# Patient Record
Sex: Male | Born: 1955
Health system: Southern US, Community
[De-identification: ages and names within clinical notes are randomized; demographics above are authoritative.]

## PROBLEM LIST (undated history)

## (undated) DIAGNOSIS — Z800 Family history of malignant neoplasm of digestive organs: Secondary | ICD-10-CM

## (undated) DIAGNOSIS — H9190 Unspecified hearing loss, unspecified ear: Secondary | ICD-10-CM

## (undated) DIAGNOSIS — K219 Gastro-esophageal reflux disease without esophagitis: Secondary | ICD-10-CM

## (undated) DIAGNOSIS — Z8619 Personal history of other infectious and parasitic diseases: Secondary | ICD-10-CM

## (undated) DIAGNOSIS — M21969 Unspecified acquired deformity of unspecified lower leg: Secondary | ICD-10-CM

## (undated) DIAGNOSIS — IMO0001 Reserved for inherently not codable concepts without codable children: Secondary | ICD-10-CM

## (undated) DIAGNOSIS — Q909 Down syndrome, unspecified: Secondary | ICD-10-CM

## (undated) DIAGNOSIS — R9431 Abnormal electrocardiogram [ECG] [EKG]: Secondary | ICD-10-CM

## (undated) DIAGNOSIS — M109 Gout, unspecified: Secondary | ICD-10-CM

## (undated) DIAGNOSIS — I447 Left bundle-branch block, unspecified: Secondary | ICD-10-CM

## (undated) DIAGNOSIS — Z598 Other problems related to housing and economic circumstances: Secondary | ICD-10-CM

## (undated) DIAGNOSIS — E785 Hyperlipidemia, unspecified: Secondary | ICD-10-CM

## (undated) DIAGNOSIS — M1990 Unspecified osteoarthritis, unspecified site: Secondary | ICD-10-CM

## (undated) DIAGNOSIS — N183 Chronic kidney disease, stage 3 unspecified: Secondary | ICD-10-CM

## (undated) DIAGNOSIS — K8020 Calculus of gallbladder without cholecystitis without obstruction: Secondary | ICD-10-CM

## (undated) DIAGNOSIS — N3289 Other specified disorders of bladder: Secondary | ICD-10-CM

## (undated) DIAGNOSIS — R413 Other amnesia: Secondary | ICD-10-CM

## (undated) HISTORY — DX: Other amnesia: R41.3

## (undated) HISTORY — DX: Down syndrome, unspecified: Q90.9

---

## 2004-03-31 ENCOUNTER — Emergency Department (HOSPITAL_COMMUNITY): Admission: EM | Admit: 2004-03-31 | Discharge: 2004-03-31 | Payer: Self-pay | Admitting: Family Medicine

## 2004-04-07 ENCOUNTER — Emergency Department (HOSPITAL_COMMUNITY): Admission: EM | Admit: 2004-04-07 | Discharge: 2004-04-07 | Payer: Self-pay | Admitting: Family Medicine

## 2010-03-13 ENCOUNTER — Ambulatory Visit (HOSPITAL_COMMUNITY): Admission: RE | Admit: 2010-03-13 | Discharge: 2010-03-13 | Payer: Self-pay | Admitting: Gastroenterology

## 2010-03-13 HISTORY — PX: COLONOSCOPY: SHX174

## 2014-08-08 DIAGNOSIS — M2012 Hallux valgus (acquired), left foot: Secondary | ICD-10-CM | POA: Diagnosis not present

## 2014-08-08 DIAGNOSIS — M2011 Hallux valgus (acquired), right foot: Secondary | ICD-10-CM | POA: Diagnosis not present

## 2014-08-08 DIAGNOSIS — M79674 Pain in right toe(s): Secondary | ICD-10-CM | POA: Diagnosis not present

## 2014-08-08 DIAGNOSIS — M2042 Other hammer toe(s) (acquired), left foot: Secondary | ICD-10-CM | POA: Diagnosis not present

## 2014-08-08 DIAGNOSIS — L602 Onychogryphosis: Secondary | ICD-10-CM | POA: Diagnosis not present

## 2014-08-08 DIAGNOSIS — M79675 Pain in left toe(s): Secondary | ICD-10-CM | POA: Diagnosis not present

## 2014-08-08 DIAGNOSIS — M2041 Other hammer toe(s) (acquired), right foot: Secondary | ICD-10-CM | POA: Diagnosis not present

## 2014-08-08 DIAGNOSIS — B351 Tinea unguium: Secondary | ICD-10-CM | POA: Diagnosis not present

## 2016-01-19 ENCOUNTER — Encounter (HOSPITAL_COMMUNITY): Payer: Self-pay | Admitting: Emergency Medicine

## 2016-01-19 ENCOUNTER — Emergency Department (HOSPITAL_COMMUNITY)
Admission: EM | Admit: 2016-01-19 | Discharge: 2016-01-19 | Disposition: A | Discharge status: Home / Self Care | Payer: Medicare Other | Attending: Emergency Medicine | Admitting: Emergency Medicine

## 2016-01-19 ENCOUNTER — Emergency Department (HOSPITAL_COMMUNITY): Payer: Medicare Other

## 2016-01-19 DIAGNOSIS — M25462 Effusion, left knee: Secondary | ICD-10-CM

## 2016-01-19 DIAGNOSIS — M11262 Other chondrocalcinosis, left knee: Secondary | ICD-10-CM

## 2016-01-19 DIAGNOSIS — M25562 Pain in left knee: Secondary | ICD-10-CM | POA: Diagnosis present

## 2016-01-19 DIAGNOSIS — M11862 Other specified crystal arthropathies, left knee: Secondary | ICD-10-CM | POA: Diagnosis not present

## 2016-01-19 LAB — GLUCOSE, SYNOVIAL FLUID: Glucose, Synovial Fluid: 36 mg/dL

## 2016-01-19 LAB — PROTEIN, SYNOVIAL FLUID: PROTEIN, SYNOVIAL FLUID: 4.9 g/dL — AB (ref 1.0–3.0)

## 2016-01-19 LAB — GRAM STAIN

## 2016-01-19 LAB — SYNOVIAL CELL COUNT + DIFF, W/ CRYSTALS
Eosinophils-Synovial: 0 % (ref 0–1)
LYMPHOCYTES-SYNOVIAL FLD: 0 % (ref 0–20)
Monocyte-Macrophage-Synovial Fluid: 10 % — ABNORMAL LOW (ref 50–90)
NEUTROPHIL, SYNOVIAL: 90 % — AB (ref 0–25)
WBC, SYNOVIAL: 26125 /mm3 — AB (ref 0–200)

## 2016-01-19 LAB — BASIC METABOLIC PANEL
ANION GAP: 11 (ref 5–15)
BUN: 17 mg/dL (ref 6–20)
CALCIUM: 8.9 mg/dL (ref 8.9–10.3)
CO2: 26 mmol/L (ref 22–32)
Chloride: 99 mmol/L — ABNORMAL LOW (ref 101–111)
Creatinine, Ser: 1.39 mg/dL — ABNORMAL HIGH (ref 0.61–1.24)
GFR, EST NON AFRICAN AMERICAN: 53 mL/min — AB (ref >60)
GLUCOSE: 129 mg/dL — AB (ref 65–99)
POTASSIUM: 4.1 mmol/L (ref 3.5–5.1)
Sodium: 136 mmol/L (ref 135–145)

## 2016-01-19 LAB — CBC WITH DIFFERENTIAL/PLATELET
BASOS ABS: 0 10*3/uL (ref 0.0–0.1)
BASOS PCT: 0 %
Eosinophils Absolute: 0 10*3/uL (ref 0.0–0.7)
Eosinophils Relative: 0 %
HEMATOCRIT: 40.2 % (ref 39.0–52.0)
HEMOGLOBIN: 13.2 g/dL (ref 13.0–17.0)
LYMPHS PCT: 14 %
Lymphs Abs: 1.4 10*3/uL (ref 0.7–4.0)
MCH: 32.7 pg (ref 26.0–34.0)
MCHC: 32.8 g/dL (ref 30.0–36.0)
MCV: 99.5 fL (ref 78.0–100.0)
MONO ABS: 1.3 10*3/uL — AB (ref 0.1–1.0)
Monocytes Relative: 14 %
NEUTROS ABS: 6.9 10*3/uL (ref 1.7–7.7)
NEUTROS PCT: 72 %
Platelets: 231 10*3/uL (ref 150–400)
RBC: 4.04 MIL/uL — AB (ref 4.22–5.81)
RDW: 13.7 % (ref 11.5–15.5)
WBC: 9.6 10*3/uL (ref 4.0–10.5)

## 2016-01-19 MED ORDER — COLCHICINE 0.6 MG PO TABS: 0.6000 mg | ORAL_TABLET | Freq: Every day | ORAL | Status: DC

## 2016-01-19 MED ORDER — MORPHINE SULFATE (PF) 4 MG/ML IV SOLN
4.0000 mg | Freq: Once | INTRAVENOUS | Status: AC
  Administered 2016-01-19: 4 mg via INTRAVENOUS
  Filled 2016-01-19: qty 1

## 2016-01-19 MED ORDER — IBUPROFEN 200 MG PO TABS
400.0000 mg | ORAL_TABLET | Freq: Once | ORAL | Status: AC
  Administered 2016-01-19: 400 mg via ORAL
  Filled 2016-01-19: qty 2

## 2016-01-19 MED ORDER — LIDOCAINE-EPINEPHRINE (PF) 2 %-1:200000 IJ SOLN
20.0000 mL | Freq: Once | INTRAMUSCULAR | Status: AC
  Administered 2016-01-19: 20 mL via INTRADERMAL
  Filled 2016-01-19: qty 20

## 2016-01-19 MED ORDER — ACETAMINOPHEN 325 MG PO TABS
650.0000 mg | ORAL_TABLET | Freq: Once | ORAL | Status: AC
  Administered 2016-01-19: 650 mg via ORAL
  Filled 2016-01-19: qty 2

## 2016-01-19 MED ORDER — NAPROXEN 375 MG PO TABS: 375.0000 mg | ORAL_TABLET | Freq: Two times a day (BID) | ORAL | Status: DC

## 2016-01-19 NOTE — ED Provider Notes (Signed)
CSN: [id number]     Arrival date & time 01/19/16  [id number] History   First MD Initiated Contact with Patient 01/19/16 [phone]     Chief Complaint  Patient presents with  . Knee Pain   LEVEL 5 CAVEAT - MENTAL RETARDATION  (Consider location/radiation/quality/duration/timing/severity/associated sxs/prior Treatment) HPI  60 year old male presents with left knee pain. Patient has a history of Down syndrome and does not usually answer questions verbally. Thus history is taken from the caregiver who is at the bedside. Patient went to the store yesterday with facility staff like typical. Staffing she may walk more than normal. This morning when he woke up he would not get out of bed. He only complaint is left leg hurt. EMS seemed isolate that his knee was swollen and this seems to be the area pain. No obvious injury per the caregiver. No reports of fever. Patient did not complain about his knee or leg before going to bed last night.  History reviewed. No pertinent past medical history. History reviewed. No pertinent past surgical history. History reviewed. No pertinent family history. Social History  Substance Use Topics  . Smoking status: Never Smoker   . Smokeless tobacco: None  . Alcohol Use: No    Review of Systems  Unable to perform ROS: Other      Allergies  Review of patient's allergies indicates not on file.  Home Medications   Prior to Admission medications   Not on File   BP 122/84 mmHg  Pulse 78  Temp(Src) 97.7 F (36.5 C) (Oral)  Resp 16  SpO2 99% Physical Exam  Constitutional: He appears well-developed and well-nourished.  HENT:  Head: Normocephalic and atraumatic.  Right Ear: External ear normal.  Left Ear: External ear normal.  Nose: Nose normal.  Eyes: Right eye exhibits no discharge. Left eye exhibits no discharge.  Neck: Neck supple.  Cardiovascular: Normal rate, normal heart sounds and intact distal pulses.   Pulses:      Dorsalis pedis pulses are 2+ on the  right side, and 2+ on the left side.  Pulmonary/Chest: Effort normal.  Abdominal: He exhibits no distension.  Musculoskeletal: He exhibits no edema.       Left knee: He exhibits decreased range of motion, swelling and effusion. He exhibits no ecchymosis and no erythema. Tenderness found.       Left upper leg: He exhibits no tenderness.       Left lower leg: He exhibits no tenderness.  Limited ROM of left knee  Neurological: He is alert. He is disoriented.  Awake, alert. Does not answer questions appropriately  Skin: Skin is warm and dry.  Nursing note and vitals reviewed.   ED Course  Procedures (including critical care time) Labs Review Labs Reviewed  BASIC METABOLIC PANEL - Abnormal; Notable for the following:    Chloride 99 (*)    Glucose, Bld 129 (*)    Creatinine, Ser 1.39 (*)    GFR calc non Af Amer 53 (*)    All other components within normal limits  CBC WITH DIFFERENTIAL/PLATELET - Abnormal; Notable for the following:    RBC 4.04 (*)    Monocytes Absolute 1.3 (*)    All other components within normal limits  SYNOVIAL CELL COUNT + DIFF, W/ CRYSTALS - Abnormal; Notable for the following:    Appearance-Synovial TURBID (*)    WBC, Synovial 26125 (*)    Neutrophil, Synovial 90 (*)    Monocyte-Macrophage-Synovial Fluid 10 (*)    All other components within  normal limits  GRAM STAIN  CULTURE, BODY FLUID-BOTTLE  GLUCOSE, SYNOVIAL FLUID  PROTEIN, SYNOVIAL FLUID    Imaging Review Dg Knee Complete 4 Views Left  01/19/2016  CLINICAL DATA:  Woke up with left knee pain and swelling this AM. "Warmness" medial aspect of left knee. Patient did extra walking yesterday. No witnessed injury and patient unable to vocalize due of Downs Syndrome. Unable to obtain medial oblique secondary to patient's pain. EXAM: LEFT KNEE - COMPLETE 4+ VIEW COMPARISON:  None. FINDINGS: There is significant tricompartmental degenerative change. Chondrocalcinosis is present. Moderate to large joint effusion  is present. The patella appears somewhat fragmented, favored to be related to degenerative changes/bipartite anatomy. However, is difficult to exclude a patellar fracture if there has been trauma. IMPRESSION: 1. Significant tricompartmental degenerative changes. 2. Moderate to large joint effusion. 3. Mild fragmentation of the patella, likely vary of normal or related to degenerative changes. Recommend a sunrise view for further evaluation. Electronically Signed   By: Nolon Nations M.D.   On: 01/19/2016 08:32   I have personally reviewed and evaluated these images and lab results as part of my medical decision-making.   EKG Interpretation None      MDM   Final diagnoses:  Knee effusion, left  Pseudogout of knee, left    Patient was worked up for possible septic joint given atraumatic left knee effusion with warmth without cellulitis. I discussed with the power of attorney, his brother-in-law, Grayce Sessions. He has verbally given me consent to do an arthrocentesis given the patient is not able to consent at this time. He is able to range his leg a little bit more after the arthrocentesis but he still has trouble and relating. However he is able to ambulate with a walker. The caregiver at the bedside feels like the facility can get a walker if he is given a prescription. His workup is consistent with pseudogout. No organisms seen on synovial fluid and this is not consistent with septic joint. Will treat with NSAIDs and refer to PCP and orthopedics.   Sherwood Gambler, MD 01/19/16 [phone]

## 2016-01-19 NOTE — ED Notes (Signed)
Pt to ER via [facility] with complaint of left knee pain. Pt coming from group home, care giver reports patient did a lot of extra walking yesterday. Pt mental status is at baseline, does not answer questions appropriately.

## 2016-01-19 NOTE — ED Notes (Signed)
Ambulated pt with walker to restroom. Pt had no difficulties or any other issues.

## 2016-01-19 NOTE — ED Provider Notes (Signed)
  Physical Exam  BP 119/75 mmHg  Pulse 79  Temp(Src) 97.7 F (36.5 C) (Oral)  Resp 16  SpO2 100%  Physical Exam  ED Course  .Joint Aspiration/Arthrocentesis Date/Time: 01/19/2016 10:10 AM Performed by: Monico Blitz Authorized by: Monico Blitz Consent: Verbal consent obtained. Risks and benefits: risks, benefits and alternatives were discussed Consent given by: patient Required items: required blood products, implants, devices, and special equipment available Patient identity confirmed: verbally with patient Time out: Immediately prior to procedure a "time out" was called to verify the correct patient, procedure, equipment, support staff and site/side marked as required. Indications: joint swelling,  pain,  possible septic joint and diagnostic evaluation  Body area: knee Joint: left knee Local anesthesia used: yes Local anesthetic: lidocaine 2% with epinephrine Anesthetic total: 6 ml Patient sedated: no Preparation: Patient was prepped and draped in the usual sterile fashion. Needle gauge: 18 G Ultrasound guidance: no Approach: superior Aspirate: yellow and cloudy Aspirate amount: 80 mL Patient tolerance: Patient tolerated the procedure well with no immediate complications         Monico Blitz, PA-C 01/19/16 [place], MD 01/19/16 1639

## 2016-01-24 LAB — CULTURE, BODY FLUID-BOTTLE: CULTURE: NO GROWTH

## 2016-02-25 DIAGNOSIS — M25562 Pain in left knee: Secondary | ICD-10-CM | POA: Diagnosis not present

## 2016-02-27 DIAGNOSIS — M109 Gout, unspecified: Secondary | ICD-10-CM | POA: Diagnosis not present

## 2016-02-27 DIAGNOSIS — N183 Chronic kidney disease, stage 3 (moderate): Secondary | ICD-10-CM | POA: Diagnosis not present

## 2016-02-27 DIAGNOSIS — I872 Venous insufficiency (chronic) (peripheral): Secondary | ICD-10-CM | POA: Diagnosis not present

## 2016-02-27 DIAGNOSIS — R7309 Other abnormal glucose: Secondary | ICD-10-CM | POA: Diagnosis not present

## 2016-02-27 DIAGNOSIS — Z125 Encounter for screening for malignant neoplasm of prostate: Secondary | ICD-10-CM | POA: Diagnosis not present

## 2016-02-27 DIAGNOSIS — Z1389 Encounter for screening for other disorder: Secondary | ICD-10-CM | POA: Diagnosis not present

## 2016-02-27 DIAGNOSIS — E7800 Pure hypercholesterolemia, unspecified: Secondary | ICD-10-CM | POA: Diagnosis not present

## 2016-02-27 DIAGNOSIS — M1990 Unspecified osteoarthritis, unspecified site: Secondary | ICD-10-CM | POA: Diagnosis not present

## 2016-02-27 DIAGNOSIS — I8310 Varicose veins of unspecified lower extremity with inflammation: Secondary | ICD-10-CM | POA: Diagnosis not present

## 2016-02-27 DIAGNOSIS — Q909 Down syndrome, unspecified: Secondary | ICD-10-CM | POA: Diagnosis not present

## 2016-02-27 DIAGNOSIS — Z0000 Encounter for general adult medical examination without abnormal findings: Secondary | ICD-10-CM | POA: Diagnosis not present

## 2016-03-09 DIAGNOSIS — M109 Gout, unspecified: Secondary | ICD-10-CM | POA: Diagnosis not present

## 2016-03-09 DIAGNOSIS — D72829 Elevated white blood cell count, unspecified: Secondary | ICD-10-CM | POA: Diagnosis not present

## 2016-03-25 DIAGNOSIS — M109 Gout, unspecified: Secondary | ICD-10-CM | POA: Diagnosis not present

## 2016-05-27 DIAGNOSIS — M10032 Idiopathic gout, left wrist: Secondary | ICD-10-CM | POA: Diagnosis not present

## 2016-06-30 DIAGNOSIS — H2513 Age-related nuclear cataract, bilateral: Secondary | ICD-10-CM | POA: Diagnosis not present

## 2016-07-07 ENCOUNTER — Emergency Department (HOSPITAL_COMMUNITY): Payer: Medicare Other

## 2016-07-07 ENCOUNTER — Encounter (HOSPITAL_COMMUNITY): Payer: Self-pay | Admitting: Emergency Medicine

## 2016-07-07 ENCOUNTER — Emergency Department (HOSPITAL_COMMUNITY)
Admission: EM | Admit: 2016-07-07 | Discharge: 2016-07-08 | Disposition: A | Discharge status: Home / Self Care | Payer: Medicare Other | Attending: Emergency Medicine | Admitting: Emergency Medicine

## 2016-07-07 DIAGNOSIS — Z79899 Other long term (current) drug therapy: Secondary | ICD-10-CM | POA: Insufficient documentation

## 2016-07-07 DIAGNOSIS — R0602 Shortness of breath: Secondary | ICD-10-CM | POA: Diagnosis not present

## 2016-07-07 DIAGNOSIS — R1013 Epigastric pain: Secondary | ICD-10-CM | POA: Insufficient documentation

## 2016-07-07 DIAGNOSIS — R1084 Generalized abdominal pain: Secondary | ICD-10-CM | POA: Diagnosis not present

## 2016-07-07 HISTORY — DX: Gout, unspecified: M10.9

## 2016-07-07 HISTORY — DX: Down syndrome, unspecified: Q90.9

## 2016-07-07 LAB — COMPREHENSIVE METABOLIC PANEL
ALBUMIN: 3.4 g/dL — AB (ref 3.5–5.0)
ALK PHOS: 73 U/L (ref 38–126)
ALT: 14 U/L — ABNORMAL LOW (ref 17–63)
AST: 22 U/L (ref 15–41)
Anion gap: 9 (ref 5–15)
BILIRUBIN TOTAL: 0.9 mg/dL (ref 0.3–1.2)
BUN: 18 mg/dL (ref 6–20)
CO2: 25 mmol/L (ref 22–32)
Calcium: 8.8 mg/dL — ABNORMAL LOW (ref 8.9–10.3)
Chloride: 103 mmol/L (ref 101–111)
Creatinine, Ser: 1.36 mg/dL — ABNORMAL HIGH (ref 0.61–1.24)
GFR calc Af Amer: >60 mL/min (ref >60)
GFR calc non Af Amer: 54 mL/min — ABNORMAL LOW (ref >60)
GLUCOSE: 107 mg/dL — AB (ref 65–99)
POTASSIUM: 3.4 mmol/L — AB (ref 3.5–5.1)
Sodium: 137 mmol/L (ref 135–145)
TOTAL PROTEIN: 6.5 g/dL (ref 6.5–8.1)

## 2016-07-07 LAB — LIPASE, BLOOD: Lipase: 17 U/L (ref 11–51)

## 2016-07-07 LAB — I-STAT CG4 LACTIC ACID, ED: LACTIC ACID, VENOUS: 0.83 mmol/L (ref 0.5–1.9)

## 2016-07-07 LAB — CBC WITH DIFFERENTIAL/PLATELET
BASOS ABS: 0 10*3/uL (ref 0.0–0.1)
BASOS PCT: 0 %
Eosinophils Absolute: 0.1 10*3/uL (ref 0.0–0.7)
Eosinophils Relative: 1 %
HEMATOCRIT: 36.2 % — AB (ref 39.0–52.0)
HEMOGLOBIN: 12.4 g/dL — AB (ref 13.0–17.0)
Lymphocytes Relative: 16 %
Lymphs Abs: 1.6 10*3/uL (ref 0.7–4.0)
MCH: 33.5 pg (ref 26.0–34.0)
MCHC: 34.3 g/dL (ref 30.0–36.0)
MCV: 97.8 fL (ref 78.0–100.0)
Monocytes Absolute: 0.8 10*3/uL (ref 0.1–1.0)
Monocytes Relative: 8 %
NEUTROS ABS: 7.8 10*3/uL — AB (ref 1.7–7.7)
NEUTROS PCT: 75 %
Platelets: 180 10*3/uL (ref 150–400)
RBC: 3.7 MIL/uL — ABNORMAL LOW (ref 4.22–5.81)
RDW: 14.9 % (ref 11.5–15.5)
WBC: 10.4 10*3/uL (ref 4.0–10.5)

## 2016-07-07 LAB — I-STAT TROPONIN, ED: Troponin i, poc: 0 ng/mL (ref 0.00–0.08)

## 2016-07-07 MED ORDER — ALUM & MAG HYDROXIDE-SIMETH 200-200-20 MG/5ML PO SUSP
30.0000 mL | Freq: Once | ORAL | Status: AC
  Administered 2016-07-07: 30 mL via ORAL
  Filled 2016-07-07: qty 30

## 2016-07-07 MED ORDER — ONDANSETRON HCL 4 MG/2ML IJ SOLN
4.0000 mg | Freq: Once | INTRAMUSCULAR | Status: AC
  Administered 2016-07-07: 4 mg via INTRAVENOUS
  Filled 2016-07-07: qty 2

## 2016-07-07 MED ORDER — MORPHINE SULFATE (PF) 2 MG/ML IV SOLN
2.0000 mg | Freq: Once | INTRAVENOUS | Status: AC
  Administered 2016-07-07: 2 mg via INTRAVENOUS
  Filled 2016-07-07: qty 1

## 2016-07-07 NOTE — ED Notes (Signed)
Pt reports abdominal pain began after eating dinner.

## 2016-07-07 NOTE — ED Triage Notes (Signed)
Per EMS, pt from a group home with c/o abdominal pain and epigastric pain beginning today. Per group home pt has no n/v/d. BP-118/70, SpO2-100% ra, HR-73, CBG-92

## 2016-07-07 NOTE — ED Provider Notes (Signed)
[facility] DEPT Provider Note   CSN: [id number] Arrival date & time: 07/07/16  2134     History   Chief Complaint Chief Complaint  Patient presents with  . Abdominal Pain    HPI Sean Murray is a 60 y.o. male.  The history is provided by the patient and a caregiver. The history is limited by a developmental delay (Down Syndrome).  Abdominal Pain   This is a new problem. The current episode started 1 to 2 hours ago. The problem occurs constantly. The problem has not changed since onset.The pain is associated with eating (started shortly after eating a Southern meal). The pain is located in the epigastric region. The quality of the pain is aching ("soreness"). The pain is moderate. Pertinent negatives include fever, diarrhea, vomiting and headaches. Associated symptoms comments: Not able to answer whether pt has nausea. The symptoms are aggravated by eating and certain positions. The symptoms are relieved by certain positions. Past workup does not include surgery.    Past Medical History:  Diagnosis Date  . Down syndrome   . Gout     There are no active problems to display for this patient.   History reviewed. No pertinent surgical history.     Home Medications    Prior to Admission medications   Medication Sig Start Date End Date Taking? Authorizing Provider  allopurinol (ZYLOPRIM) 100 MG tablet Take 200 mg by mouth daily.    Historical Provider, MD  clotrimazole-betamethasone (LOTRISONE) cream Apply 1 application topically 2 (two) times daily.    Historical Provider, MD  guaifenesin (ROBITUSSIN) 100 MG/5ML syrup Take 200 mg by mouth 3 (three) times daily as needed for cough.    Historical Provider, MD  Lutein 20 MG TABS Take 1 tablet by mouth daily.    Historical Provider, MD  Multiple Vitamins-Minerals (MULTIVITAMIN WITH MINERALS) tablet Take 1 tablet by mouth daily.    Historical Provider, MD  naproxen (NAPROSYN) 375 MG tablet Take 1 tablet (375 mg total) by mouth 2  (two) times daily. 01/19/16   Sherwood Gambler, MD  ranitidine (ZANTAC) 150 MG tablet Take 1 tablet (150 mg total) by mouth 2 (two) times daily. 07/08/16 07/22/16  Paralee Cancel, MD  simvastatin (ZOCOR) 20 MG tablet Take 20 mg by mouth daily.    Historical Provider, MD    Family History No family history on file.  Social History Social History  Substance Use Topics  . Smoking status: Never Smoker  . Smokeless tobacco: Never Used  . Alcohol use No     Allergies   Review of patient's allergies indicates no known allergies.   Review of Systems Review of Systems  Constitutional: Negative for diaphoresis and fever.  HENT: Negative for trouble swallowing and voice change.   Respiratory: Positive for shortness of breath.   Cardiovascular: Negative for chest pain.  Gastrointestinal: Positive for abdominal pain. Negative for diarrhea and vomiting.  Genitourinary: Negative for flank pain.  Musculoskeletal: Negative for back pain.  Skin: Negative for rash.  Neurological: Negative for headaches.  Hematological: Does not bruise/bleed easily.     Physical Exam Updated Vital Signs BP 103/75   Pulse 85   Temp 97.8 F (36.6 C) (Oral)   Resp 14   SpO2 100%   Physical Exam  Constitutional: He appears well-developed and well-nourished. He appears distressed.  Mild distress 2/2 pain, cooperative. Exam c/w Down syndrome  HENT:  Head: Normocephalic and atraumatic.  Eyes: Conjunctivae are normal. No scleral icterus.  Neck: Normal range of  motion. Neck supple.  Cardiovascular: Normal rate, regular rhythm and intact distal pulses.   Pulmonary/Chest: Effort normal and breath sounds normal. No respiratory distress.  Abdominal: Soft. He exhibits no distension. There is tenderness. There is no rebound and no guarding.  Epigastric abdominal pain  Musculoskeletal: He exhibits no edema or tenderness.  B/l LE with discoloration c/w chronic venous stasis, warm and perfused legs and feet, no  edema currently  Neurological: He is alert. Coordination normal.  Skin: Skin is warm and dry. Capillary refill takes less than 2 seconds. No rash noted. He is not diaphoretic.  Psychiatric: He has a normal mood and affect.  Nursing note and vitals reviewed.    ED Treatments / Results  Labs (all labs ordered are listed, but only abnormal results are displayed) Labs Reviewed  CBC WITH DIFFERENTIAL/PLATELET - Abnormal; Notable for the following:       Result Value   RBC 3.70 (*)    Hemoglobin 12.4 (*)    HCT 36.2 (*)    Neutro Abs 7.8 (*)    All other components within normal limits  COMPREHENSIVE METABOLIC PANEL - Abnormal; Notable for the following:    Potassium 3.4 (*)    Glucose, Bld 107 (*)    Creatinine, Ser 1.36 (*)    Calcium 8.8 (*)    Albumin 3.4 (*)    ALT 14 (*)    GFR calc non Af Amer 54 (*)    All other components within normal limits  LIPASE, BLOOD  I-STAT TROPOININ, ED  I-STAT CG4 LACTIC ACID, ED  I-STAT TROPOININ, ED    EKG  EKG Interpretation  Date/Time:  Tuesday July 07 2016 21:39:49 EDT Ventricular Rate:  83 PR Interval:    QRS Duration: 132 QT Interval:  429 QTC Calculation: 505 R Axis:   56 Text Interpretation:  Sinus rhythm Left bundle branch block No old tracing to compare Confirmed by FLOYD MD, DANIEL [phone]) on 07/07/2016 10:21:30 PM       Radiology Ct Abdomen Pelvis W Contrast  Result Date: 07/08/2016 CLINICAL DATA:  Acute onset of generalized abdominal pain and epigastric tenderness. Initial encounter. EXAM: CT ABDOMEN AND PELVIS WITH CONTRAST TECHNIQUE: Multidetector CT imaging of the abdomen and pelvis was performed using the standard protocol following bolus administration of intravenous contrast. CONTRAST:  2mL ISOVUE-300 IOPAMIDOL (ISOVUE-300) INJECTION 61% COMPARISON:  None. FINDINGS: Lower chest: Minimal bibasilar atelectasis is noted. The visualized portions of the mediastinum are unremarkable. Hepatobiliary: The liver is  unremarkable in appearance. A small stone is noted within the gallbladder. The gallbladder is otherwise grossly unremarkable. The common bile duct remains normal in caliber. Pancreas: The pancreas is within normal limits. Spleen: The spleen is unremarkable in appearance. Adrenals/Urinary Tract: The adrenal glands are unremarkable in appearance. The kidneys are within normal limits. There is no evidence of hydronephrosis. No renal or ureteral stones are identified. No perinephric stranding is seen. Stomach/Bowel: The stomach is unremarkable in appearance. There is question of mild haziness about the second segment of the duodenum. Would correlate clinically for any evidence of duodenal ulcer. This may simply be artifactual in nature. The remaining small bowel is unremarkable in appearance. The appendix is not visualized; there is no evidence for appendicitis. The colon is unremarkable in appearance. Vascular/Lymphatic: The abdominal aorta is unremarkable in appearance. The inferior vena cava is grossly unremarkable. No retroperitoneal lymphadenopathy is seen. No pelvic sidewall lymphadenopathy is identified. Reproductive: Mild anterior bladder wall thickening is noted, of uncertain significance. An underlying mass  cannot be excluded. The prostate remains normal in size. Other: No additional soft tissue abnormalities are seen. Musculoskeletal: No acute osseous abnormalities are identified. There is chronic compression deformity involving the left side of L5. The visualized musculature is unremarkable in appearance. IMPRESSION: 1. Question of mild haziness about the second segment of the duodenum. This may simply be artifactual in nature, though would correlate clinically for any evidence of duodenal ulcer. 2. Small stone noted within the gallbladder. Gallbladder otherwise unremarkable. 3. Mild anterior bladder wall thickening, of uncertain significance. An underlying mass cannot be entirely excluded. Cystoscopy would  be helpful for further evaluation, when and as deemed clinically appropriate. 4. Chronic compression deformity involving the left side of L5. Electronically Signed   By: Garald Balding M.D.   On: 07/08/2016 01:30   Dg Chest Portable 1 View  Result Date: 07/07/2016 CLINICAL DATA:  Shortness of breath EXAM: PORTABLE CHEST 1 VIEW COMPARISON:  None. FINDINGS: Normal lung inflation. Cardiomediastinal contours are normal. No focal airspace consolidation or pulmonary edema. No pneumothorax or pleural effusion. IMPRESSION: No focal airspace disease. Electronically Signed   By: Ulyses Jarred M.D.   On: 07/07/2016 22:43    Procedures Procedures (including critical care time)  Medications Ordered in ED Medications  morphine 2 MG/ML injection 2 mg (2 mg Intravenous Given 07/07/16 2252)  alum & mag hydroxide-simeth (MAALOX/MYLANTA) 200-200-20 MG/5ML suspension 30 mL (30 mLs Oral Given 07/07/16 2252)  ondansetron (ZOFRAN) injection 4 mg (4 mg Intravenous Given 07/07/16 2252)  iopamidol (ISOVUE-300) 61 % injection 80 mL (80 mLs Intravenous Contrast Given 07/08/16 0040)  famotidine (PEPCID) tablet 20 mg (20 mg Oral Given 07/08/16 0241)     Initial Impression / Assessment and Plan / ED Course  I have reviewed the triage vital signs and the nursing notes.  Pertinent labs & imaging results that were available during my care of the patient were reviewed by me and considered in my medical decision making (see chart for details).  Clinical Course   Sean Murray is a 60 y.o. male with Down Syndrome but no known cardiac problems, no abdominal hx or surgeries, who presents to ED from group home for evaluation of epigastric abdominal pain, acute onset after eating southern-style food tonight. No vomiting, no known diarrhea, though caregivers do not assist pt with bathroom typically, so unknown. On arrival to Ed, pt in mild distress 2/2 pain and complaining of "I can't breath", though noted to have normal RR and O2  sat on Ra. EKG with LBBB, unknown baseline. Delta trop negative. CXR unremarkable, normal mediastinum, no obvious lung abnormalities. Pain resolved with 2mg  IV morphine and Maalox with benign repeat examination. Resolution of dyspnea. CT abdomen/pelvis demonstrates no acute surgical abnormality, ? Duodenal ulcer, this would be c/w with pt sx. Given pepcid in Ed. Rx Zantac. Advised can take OTC Maalox and Tums prn breakthrough pain as well, and to f/u with PCP this week for recheck. Advised to return to ER for any new, worse, or concerning symptoms. The care provider demonstrates understanding of this and comfort with d/c home.   Pt condition, course, and discharge were discussed with attending physician Dr. Deno Etienne.  Final Clinical Impressions(s) / ED Diagnoses   Final diagnoses:  Epigastric pain    New Prescriptions Discharge Medication List as of 07/08/2016  2:14 AM    START taking these medications   Details  ranitidine (ZANTAC) 150 MG tablet Take 1 tablet (150 mg total) by mouth 2 (two) times daily., Starting Wed  07/08/2016, Until Wed 07/22/2016, Print          Paralee Cancel, MD 07/09/16 [place], DO 07/09/16 0117

## 2016-07-08 ENCOUNTER — Emergency Department (HOSPITAL_COMMUNITY): Payer: Medicare Other

## 2016-07-08 DIAGNOSIS — R1084 Generalized abdominal pain: Secondary | ICD-10-CM | POA: Diagnosis not present

## 2016-07-08 DIAGNOSIS — R1013 Epigastric pain: Secondary | ICD-10-CM | POA: Diagnosis not present

## 2016-07-08 LAB — I-STAT TROPONIN, ED: Troponin i, poc: 0 ng/mL (ref 0.00–0.08)

## 2016-07-08 MED ORDER — FAMOTIDINE 20 MG PO TABS
20.0000 mg | ORAL_TABLET | Freq: Once | ORAL | Status: AC
  Administered 2016-07-08: 20 mg via ORAL
  Filled 2016-07-08: qty 1

## 2016-07-08 MED ORDER — IOPAMIDOL (ISOVUE-300) INJECTION 61%
80.0000 mL | Freq: Once | INTRAVENOUS | Status: AC | PRN
  Administered 2016-07-08: 80 mL via INTRAVENOUS

## 2016-07-08 MED ORDER — IOPAMIDOL (ISOVUE-300) INJECTION 61%
INTRAVENOUS | Status: AC
  Filled 2016-07-08: qty 100

## 2016-07-08 MED ORDER — RANITIDINE HCL 150 MG PO TABS: 150.0000 mg | ORAL_TABLET | Freq: Two times a day (BID) | ORAL | 0 refills | Status: DC

## 2016-07-08 NOTE — ED Notes (Signed)
Patient transported to CT 

## 2016-07-09 DIAGNOSIS — M542 Cervicalgia: Secondary | ICD-10-CM | POA: Diagnosis not present

## 2016-07-09 DIAGNOSIS — R109 Unspecified abdominal pain: Secondary | ICD-10-CM | POA: Diagnosis not present

## 2016-07-09 DIAGNOSIS — M62830 Muscle spasm of back: Secondary | ICD-10-CM | POA: Diagnosis not present

## 2016-07-09 DIAGNOSIS — K269 Duodenal ulcer, unspecified as acute or chronic, without hemorrhage or perforation: Secondary | ICD-10-CM | POA: Diagnosis not present

## 2016-07-09 DIAGNOSIS — Z23 Encounter for immunization: Secondary | ICD-10-CM | POA: Diagnosis not present

## 2016-07-17 DIAGNOSIS — L270 Generalized skin eruption due to drugs and medicaments taken internally: Secondary | ICD-10-CM | POA: Diagnosis not present

## 2016-07-20 ENCOUNTER — Other Ambulatory Visit: Payer: Self-pay | Admitting: Internal Medicine

## 2016-07-20 DIAGNOSIS — R945 Abnormal results of liver function studies: Secondary | ICD-10-CM

## 2016-07-21 ENCOUNTER — Other Ambulatory Visit: Payer: Medicaid Other

## 2016-07-23 ENCOUNTER — Ambulatory Visit
Admission: RE | Admit: 2016-07-23 | Discharge: 2016-07-23 | Disposition: A | Discharge status: Home / Self Care | Payer: Medicaid Other | Source: Ambulatory Visit | Attending: Internal Medicine | Admitting: Internal Medicine

## 2016-07-23 DIAGNOSIS — R945 Abnormal results of liver function studies: Secondary | ICD-10-CM

## 2016-07-23 DIAGNOSIS — R7989 Other specified abnormal findings of blood chemistry: Secondary | ICD-10-CM | POA: Diagnosis not present

## 2016-07-31 DIAGNOSIS — L270 Generalized skin eruption due to drugs and medicaments taken internally: Secondary | ICD-10-CM | POA: Diagnosis not present

## 2016-08-04 DIAGNOSIS — R1084 Generalized abdominal pain: Secondary | ICD-10-CM | POA: Diagnosis not present

## 2016-08-04 DIAGNOSIS — R933 Abnormal findings on diagnostic imaging of other parts of digestive tract: Secondary | ICD-10-CM | POA: Diagnosis not present

## 2016-08-18 ENCOUNTER — Other Ambulatory Visit: Payer: Self-pay | Admitting: Gastroenterology

## 2016-08-18 NOTE — Progress Notes (Signed)
I was unable to patients care giver- phone was not in service, I call [employer] and paged the person on call, I did not receive a call back.

## 2016-08-18 NOTE — Progress Notes (Addendum)
Alllison Z, PA for Anesthesia found notes under  Media Tab that had information from EKG in 2005 Left BBB was present at that time.

## 2016-08-19 ENCOUNTER — Encounter (HOSPITAL_COMMUNITY): Payer: Self-pay | Admitting: Certified Registered Nurse Anesthetist

## 2016-08-19 ENCOUNTER — Encounter (HOSPITAL_COMMUNITY): Admission: RE | Payer: Self-pay | Source: Ambulatory Visit

## 2016-08-19 ENCOUNTER — Ambulatory Visit (HOSPITAL_COMMUNITY): Admission: RE | Admit: 2016-08-19 | Payer: Medicare Other | Source: Ambulatory Visit | Admitting: Gastroenterology

## 2016-08-19 SURGERY — ESOPHAGOGASTRODUODENOSCOPY (EGD) WITH PROPOFOL
Anesthesia: Monitor Anesthesia Care

## 2016-08-28 DIAGNOSIS — N183 Chronic kidney disease, stage 3 (moderate): Secondary | ICD-10-CM | POA: Diagnosis not present

## 2016-08-28 DIAGNOSIS — K269 Duodenal ulcer, unspecified as acute or chronic, without hemorrhage or perforation: Secondary | ICD-10-CM | POA: Diagnosis not present

## 2016-08-28 DIAGNOSIS — M109 Gout, unspecified: Secondary | ICD-10-CM | POA: Diagnosis not present

## 2016-08-28 DIAGNOSIS — Q909 Down syndrome, unspecified: Secondary | ICD-10-CM | POA: Diagnosis not present

## 2016-08-28 DIAGNOSIS — E7800 Pure hypercholesterolemia, unspecified: Secondary | ICD-10-CM | POA: Diagnosis not present

## 2016-08-28 DIAGNOSIS — N33 Bladder disorders in diseases classified elsewhere: Secondary | ICD-10-CM | POA: Diagnosis not present

## 2016-08-28 DIAGNOSIS — D72829 Elevated white blood cell count, unspecified: Secondary | ICD-10-CM | POA: Diagnosis not present

## 2016-10-08 ENCOUNTER — Ambulatory Visit: Payer: Medicare Other | Admitting: Podiatry

## 2016-10-12 ENCOUNTER — Ambulatory Visit (INDEPENDENT_AMBULATORY_CARE_PROVIDER_SITE_OTHER): Payer: Medicare Other | Admitting: Podiatry

## 2016-10-12 ENCOUNTER — Encounter: Payer: Self-pay | Admitting: Podiatry

## 2016-10-12 VITALS — BP 125/88 | HR 74 | Resp 14

## 2016-10-12 DIAGNOSIS — L84 Corns and callosities: Secondary | ICD-10-CM

## 2016-10-12 DIAGNOSIS — B351 Tinea unguium: Secondary | ICD-10-CM

## 2016-10-12 DIAGNOSIS — M2010 Hallux valgus (acquired), unspecified foot: Secondary | ICD-10-CM | POA: Diagnosis not present

## 2016-10-12 DIAGNOSIS — Q828 Other specified congenital malformations of skin: Secondary | ICD-10-CM

## 2016-10-12 DIAGNOSIS — L603 Nail dystrophy: Secondary | ICD-10-CM

## 2016-10-12 DIAGNOSIS — M79672 Pain in left foot: Secondary | ICD-10-CM

## 2016-10-12 DIAGNOSIS — M2041 Other hammer toe(s) (acquired), right foot: Secondary | ICD-10-CM | POA: Diagnosis not present

## 2016-10-12 DIAGNOSIS — L608 Other nail disorders: Secondary | ICD-10-CM

## 2016-10-12 DIAGNOSIS — M2042 Other hammer toe(s) (acquired), left foot: Secondary | ICD-10-CM

## 2016-10-12 DIAGNOSIS — L851 Acquired keratosis [keratoderma] palmaris et plantaris: Secondary | ICD-10-CM

## 2016-10-12 DIAGNOSIS — M79671 Pain in right foot: Secondary | ICD-10-CM | POA: Diagnosis not present

## 2016-10-12 DIAGNOSIS — M79609 Pain in unspecified limb: Secondary | ICD-10-CM

## 2016-10-12 DIAGNOSIS — M79676 Pain in unspecified toe(s): Secondary | ICD-10-CM

## 2016-10-12 DIAGNOSIS — M21619 Bunion of unspecified foot: Secondary | ICD-10-CM | POA: Diagnosis not present

## 2016-10-14 NOTE — Progress Notes (Signed)
   SUBJECTIVE Patient  presents to office today complaining of elongated, thickened nails. Pain while ambulating in shoes. Patient is unable to trim their own nails.   OBJECTIVE General Patient is awake, alert, and oriented x 3 and in no acute distress. Derm hyperkeratotic skin lesions noted overlying the PIPJ of the second digit bilateral. Skin is dry and supple bilateral. Negative open lesions or macerations. Remaining integument unremarkable. Nails are tender, long, thickened and dystrophic with subungual debris, consistent with onychomycosis, 1-5 bilateral. No signs of infection noted. Vasc  DP and PT pedal pulses palpable bilaterally. Temperature gradient within normal limits.  Neuro Epicritic and protective threshold sensation diminished bilaterally.  Musculoskeletal Exam hallux abductovalgus deformity noted bilateral. Hammertoe contracture 2-5 bilateral. No symptomatic pedal deformities noted bilateral. Muscular strength within normal limits.  ASSESSMENT 1. Onychodystrophic nails 1-5 bilateral with hyperkeratosis of nails.  2. Onychomycosis of nail due to dermatophyte bilateral 3. Pain in foot bilateral 4. Painful corn lesion second digit bilateral  PLAN OF CARE 1. Patient evaluated today.  2. Instructed to maintain good pedal hygiene and foot care.  3. Mechanical debridement of nails 1-5 bilaterally performed using a nail nipper. Filed with dremel without incident.  4. Excisional debridement of the callus lesion overlying the PIPJ of the second digit bilaterally was performed using a chisel blade without incident. 5. Return to clinic in 3 mos.    Edrick Kins, DPM [facility]  Dr. Edrick Kins, [address]                                        [place], [place] [address]                Office [phone]  Fax [phone]

## 2016-10-26 DIAGNOSIS — D414 Neoplasm of uncertain behavior of bladder: Secondary | ICD-10-CM | POA: Diagnosis not present

## 2016-10-26 DIAGNOSIS — R350 Frequency of micturition: Secondary | ICD-10-CM | POA: Diagnosis not present

## 2016-10-28 ENCOUNTER — Other Ambulatory Visit: Payer: Self-pay | Admitting: Urology

## 2016-11-05 ENCOUNTER — Encounter (HOSPITAL_BASED_OUTPATIENT_CLINIC_OR_DEPARTMENT_OTHER): Payer: Self-pay | Admitting: *Deleted

## 2016-11-05 NOTE — Progress Notes (Addendum)
SPOKE W/ JACKIE MURPHY RN FROM [employer] , WHOM MONITORS PT MEDICALLY.  OBTAINED PT HX W/ MEDS FROM JACKIE.  AND INSTRUCTIONS FOR DOS GIVEN TO HER.   NPO AFTER MN.  ARRIVE AT 1130.  NEEDS ISTAT.  CURRENT EKG IN CHART AND EPIC. WILL TAKE AM MEDS W/ SIPS OF WATER.    PT WILL BE TRANSPORTED BY PT'S GROUP HOME, [facility], W/ PATIENT PROFESSIONAL AND JACKIE WILL BE ASSESSING PT UPON GETTING BACK TO GROUP HOME.   JACKIE STATED THAT PT'S BROTHER-N-LAW, ERNEST SYKES, IS DUAL POWER-OF-ATTORNEY [phone], CELL#[phone]).  WILL NEED TO CALL AND GET SURGICAL PERMIT SIGNED W/ 2 WITNESS'S.  ADDENDUM:  REVIEWED CHART W/ DR MASSAGEE MDA,  STATED PT HAS DOWN SYNDROME W/ AN EKG DONE 10/ 2017 AT ED THAT SHOW LBBB AND BORDERLINE DIABETES.  NEED H&P FROM HIS MEDICAL DOCTOR STATING DIABETES STABLE AND ADDRESS EKG FINDING SINCE NO PREVIOUS EKG TO COMPARE TO.  PT WILL NEED THIS BEFORE HAVING SURGERY W/ GENERAL ANESTHESIA.  CALLED AND LM FOR SELITA, TO CALL BACK.  ALSO, CALLED TO SPEAK W/ JACKIE MURPHY RN BUT WILL NEED TO CALL BACK WHEN SHE IS THE OFFICE AT 1030.  DR MASSAGEE MDA,  ALSO STATED HE WILL SPEAK W/ DR MCKENZIE TODAY.  ADDENDUM:  RECEIVED CALL BACK FROM JACKIE MURPHY RN AND OBTAINED PT'S WHOM HIS PCP IS,  DR Romelle Starcher HUSAIN ([facility]).  CALLED AND LM FOR DR HUSAIN NURSE.  RECEIVED CALL BACK FROM NICOLE , DR HUSAIN NURSE.  NICOLE STATED THAT DR HUSAIN HAS LEFT FOR THE DAY BUT SHE ABLE TO FAX PT'S LAST TWO OFFICE VISITS (THEY DID NOT HAVE A EKG ).  AFTER RECEIVING FAX, REVIEWED CHART W/ DR MASSAGEE MDA,  OK TO PROCEED SINCE LBBB APPEARS OLD AND PCP NOTED THIS IN HIS VISIT NOTE.  CALLED AND SPOKE W/ PT'S HCPOA, ERNEST SYKES FOR TELEPHONE CONSENT FOR SURGICAL PERMIT.  ERNEST VERBALIZED UNDERSTANDING OF PROCEDURE W/ NO FARTHER QUESTIONS.  PERMIT SIGNED BY 2 RN'S, MYSELF AND SHARON COOK.

## 2016-11-06 ENCOUNTER — Encounter (HOSPITAL_BASED_OUTPATIENT_CLINIC_OR_DEPARTMENT_OTHER): Payer: Self-pay | Admitting: *Deleted

## 2016-11-09 ENCOUNTER — Ambulatory Visit (HOSPITAL_BASED_OUTPATIENT_CLINIC_OR_DEPARTMENT_OTHER): Payer: Medicare Other | Admitting: Anesthesiology

## 2016-11-09 ENCOUNTER — Ambulatory Visit (HOSPITAL_BASED_OUTPATIENT_CLINIC_OR_DEPARTMENT_OTHER)
Admission: RE | Admit: 2016-11-09 | Discharge: 2016-11-09 | Disposition: A | Discharge status: Home / Self Care | Payer: Medicare Other | Source: Ambulatory Visit | Attending: Urology | Admitting: Urology

## 2016-11-09 ENCOUNTER — Encounter (HOSPITAL_BASED_OUTPATIENT_CLINIC_OR_DEPARTMENT_OTHER)
Admission: RE | Disposition: A | Discharge status: Home / Self Care | Payer: Self-pay | Source: Ambulatory Visit | Attending: Urology

## 2016-11-09 ENCOUNTER — Encounter (HOSPITAL_BASED_OUTPATIENT_CLINIC_OR_DEPARTMENT_OTHER): Payer: Self-pay | Admitting: *Deleted

## 2016-11-09 DIAGNOSIS — N183 Chronic kidney disease, stage 3 (moderate): Secondary | ICD-10-CM | POA: Diagnosis not present

## 2016-11-09 DIAGNOSIS — I447 Left bundle-branch block, unspecified: Secondary | ICD-10-CM | POA: Insufficient documentation

## 2016-11-09 DIAGNOSIS — N329 Bladder disorder, unspecified: Secondary | ICD-10-CM | POA: Diagnosis not present

## 2016-11-09 DIAGNOSIS — D494 Neoplasm of unspecified behavior of bladder: Secondary | ICD-10-CM | POA: Diagnosis not present

## 2016-11-09 DIAGNOSIS — Z79899 Other long term (current) drug therapy: Secondary | ICD-10-CM | POA: Insufficient documentation

## 2016-11-09 DIAGNOSIS — E785 Hyperlipidemia, unspecified: Secondary | ICD-10-CM | POA: Diagnosis not present

## 2016-11-09 DIAGNOSIS — N3289 Other specified disorders of bladder: Secondary | ICD-10-CM | POA: Insufficient documentation

## 2016-11-09 DIAGNOSIS — M109 Gout, unspecified: Secondary | ICD-10-CM | POA: Insufficient documentation

## 2016-11-09 DIAGNOSIS — Q909 Down syndrome, unspecified: Secondary | ICD-10-CM | POA: Diagnosis not present

## 2016-11-09 DIAGNOSIS — K219 Gastro-esophageal reflux disease without esophagitis: Secondary | ICD-10-CM | POA: Diagnosis not present

## 2016-11-09 HISTORY — DX: Reserved for inherently not codable concepts without codable children: IMO0001

## 2016-11-09 HISTORY — DX: Family history of malignant neoplasm of digestive organs: Z80.0

## 2016-11-09 HISTORY — PX: CYSTOSCOPY W/ RETROGRADES: SHX1426

## 2016-11-09 HISTORY — DX: Chronic kidney disease, stage 3 (moderate): N18.3

## 2016-11-09 HISTORY — DX: Left bundle-branch block, unspecified: I44.7

## 2016-11-09 HISTORY — DX: Unspecified osteoarthritis, unspecified site: M19.90

## 2016-11-09 HISTORY — DX: Other problems related to housing and economic circumstances: Z59.8

## 2016-11-09 HISTORY — DX: Hyperlipidemia, unspecified: E78.5

## 2016-11-09 HISTORY — DX: Chronic kidney disease, stage 3 unspecified: N18.30

## 2016-11-09 HISTORY — DX: Gout, unspecified: M10.9

## 2016-11-09 HISTORY — DX: Gastro-esophageal reflux disease without esophagitis: K21.9

## 2016-11-09 HISTORY — DX: Other specified disorders of bladder: N32.89

## 2016-11-09 HISTORY — PX: CYSTOSCOPY WITH BIOPSY: SHX5122

## 2016-11-09 HISTORY — DX: Personal history of other infectious and parasitic diseases: Z86.19

## 2016-11-09 HISTORY — DX: Unspecified hearing loss, unspecified ear: H91.90

## 2016-11-09 HISTORY — DX: Calculus of gallbladder without cholecystitis without obstruction: K80.20

## 2016-11-09 LAB — POCT I-STAT, CHEM 8
BUN: 26 mg/dL — AB (ref 6–20)
CALCIUM ION: 1.23 mmol/L (ref 1.15–1.40)
CHLORIDE: 102 mmol/L (ref 101–111)
CREATININE: 1.4 mg/dL — AB (ref 0.61–1.24)
GLUCOSE: 82 mg/dL (ref 65–99)
HCT: 41 % (ref 39.0–52.0)
Hemoglobin: 13.9 g/dL (ref 13.0–17.0)
Potassium: 4.1 mmol/L (ref 3.5–5.1)
Sodium: 142 mmol/L (ref 135–145)
TCO2: 28 mmol/L (ref 0–100)

## 2016-11-09 SURGERY — CYSTOSCOPY, WITH RETROGRADE PYELOGRAM
Anesthesia: General | Site: Renal

## 2016-11-09 MED ORDER — LIDOCAINE 2% (20 MG/ML) 5 ML SYRINGE
INTRAMUSCULAR | Status: AC
  Filled 2016-11-09: qty 5

## 2016-11-09 MED ORDER — TRAMADOL HCL 50 MG PO TABS
50.0000 mg | ORAL_TABLET | Freq: Once | ORAL | Status: AC
  Administered 2016-11-09: 50 mg via ORAL
  Filled 2016-11-09: qty 1

## 2016-11-09 MED ORDER — FENTANYL CITRATE (PF) 100 MCG/2ML IJ SOLN
INTRAMUSCULAR | Status: AC
  Filled 2016-11-09: qty 2

## 2016-11-09 MED ORDER — MIDAZOLAM HCL 2 MG/2ML IJ SOLN
INTRAMUSCULAR | Status: AC
  Filled 2016-11-09: qty 2

## 2016-11-09 MED ORDER — CEFAZOLIN SODIUM-DEXTROSE 2-4 GM/100ML-% IV SOLN
2.0000 g | INTRAVENOUS | Status: AC
  Administered 2016-11-09: 2 g via INTRAVENOUS
  Filled 2016-11-09: qty 100

## 2016-11-09 MED ORDER — PROPOFOL 10 MG/ML IV BOLUS
INTRAVENOUS | Status: AC
  Filled 2016-11-09: qty 20

## 2016-11-09 MED ORDER — LACTATED RINGERS IV SOLN
INTRAVENOUS | Status: DC
  Administered 2016-11-09: 13:00:00 via INTRAVENOUS
  Filled 2016-11-09: qty 1000

## 2016-11-09 MED ORDER — MIDAZOLAM HCL 5 MG/5ML IJ SOLN
INTRAMUSCULAR | Status: DC | PRN
  Administered 2016-11-09: 1 mg via INTRAVENOUS

## 2016-11-09 MED ORDER — LIDOCAINE 2% (20 MG/ML) 5 ML SYRINGE
INTRAMUSCULAR | Status: DC | PRN
  Administered 2016-11-09: 50 mg via INTRAVENOUS

## 2016-11-09 MED ORDER — TRAMADOL HCL 50 MG PO TABS
ORAL_TABLET | ORAL | Status: AC
  Filled 2016-11-09: qty 1

## 2016-11-09 MED ORDER — FENTANYL CITRATE (PF) 100 MCG/2ML IJ SOLN
INTRAMUSCULAR | Status: DC | PRN
  Administered 2016-11-09: 50 ug via INTRAVENOUS

## 2016-11-09 MED ORDER — ONDANSETRON HCL 4 MG/2ML IJ SOLN
INTRAMUSCULAR | Status: DC | PRN
  Administered 2016-11-09: 4 mg via INTRAVENOUS

## 2016-11-09 MED ORDER — CEFAZOLIN IN D5W 1 GM/50ML IV SOLN
1.0000 g | INTRAVENOUS | Status: DC
Start: 2016-11-09 — End: 2016-11-09
  Filled 2016-11-09: qty 50

## 2016-11-09 MED ORDER — PROPOFOL 10 MG/ML IV BOLUS
INTRAVENOUS | Status: DC | PRN
  Administered 2016-11-09: 100 mg via INTRAVENOUS

## 2016-11-09 MED ORDER — TRAMADOL HCL 50 MG PO TABS: 50.0000 mg | ORAL_TABLET | Freq: Four times a day (QID) | ORAL | 0 refills | Status: DC | PRN

## 2016-11-09 MED ORDER — IOHEXOL 300 MG/ML  SOLN
INTRAMUSCULAR | Status: DC | PRN
  Administered 2016-11-09: 10 mL

## 2016-11-09 MED ORDER — STERILE WATER FOR IRRIGATION IR SOLN
Status: DC | PRN
  Administered 2016-11-09: 3000 mL

## 2016-11-09 MED ORDER — CEFAZOLIN SODIUM-DEXTROSE 2-4 GM/100ML-% IV SOLN
INTRAVENOUS | Status: AC
  Filled 2016-11-09: qty 100

## 2016-11-09 SURGICAL SUPPLY — 26 items
ADAPTER CATH WHT DISP STRL (CATHETERS) IMPLANT
BAG DRAIN URO-CYSTO SKYTR STRL (DRAIN) ×8 IMPLANT
BASKET ZERO TIP NITINOL 2.4FR (BASKET) IMPLANT
CATH INTERMIT  6FR 70CM (CATHETERS) ×4 IMPLANT
CLOTH BEACON ORANGE TIMEOUT ST (SAFETY) ×4 IMPLANT
ELECT REM PT RETURN 9FT ADLT (ELECTROSURGICAL) ×4
ELECTRODE REM PT RTRN 9FT ADLT (ELECTROSURGICAL) ×2 IMPLANT
GLOVE BIO SURGEON STRL SZ8 (GLOVE) ×4 IMPLANT
GLOVE BIOGEL PI IND STRL 7.5 (GLOVE) ×4 IMPLANT
GLOVE BIOGEL PI INDICATOR 7.5 (GLOVE) ×4
GOWN STRL REUS W/ TWL LRG LVL3 (GOWN DISPOSABLE) ×2 IMPLANT
GOWN STRL REUS W/ TWL XL LVL3 (GOWN DISPOSABLE) ×2 IMPLANT
GOWN STRL REUS W/TWL LRG LVL3 (GOWN DISPOSABLE) ×2
GOWN STRL REUS W/TWL XL LVL3 (GOWN DISPOSABLE) ×2
GUIDEWIRE ANG ZIPWIRE 038X150 (WIRE) IMPLANT
GUIDEWIRE STR DUAL SENSOR (WIRE) IMPLANT
IV NS IRRIG 3000ML ARTHROMATIC (IV SOLUTION) IMPLANT
KIT ROOM TURNOVER WOR (KITS) ×4 IMPLANT
MANIFOLD NEPTUNE II (INSTRUMENTS) ×4 IMPLANT
NEEDLE SPNL 22GX7 QUINCKE BK (NEEDLE) IMPLANT
NS IRRIG 500ML POUR BTL (IV SOLUTION) IMPLANT
PACK CYSTO (CUSTOM PROCEDURE TRAY) ×4 IMPLANT
SYR 20CC LL (SYRINGE) ×8 IMPLANT
TUBE CONNECTING 12'X1/4 (SUCTIONS) ×1
TUBE CONNECTING 12X1/4 (SUCTIONS) ×3 IMPLANT
WATER STERILE IRR 3000ML UROMA (IV SOLUTION) ×4 IMPLANT

## 2016-11-09 NOTE — Transfer of Care (Signed)
Immediate Anesthesia Transfer of Care Note  Patient: Sean Murray  Procedure(s) Performed: Procedure(s) (LRB): CYSTOSCOPY WITH RETROGRADE PYELOGRAM (Bilateral) CYSTOSCOPY WITH  BLADDER BIOPSY, FULGERATION (N/A)  Patient Location: PACU  Anesthesia Type: General  Level of Consciousness: awake, oriented, sedated and patient cooperative  Airway & Oxygen Therapy: Patient Spontanous Breathing and Patient connected to face mask oxygen  Post-op Assessment: Report given to PACU RN and Post -op Vital signs reviewed and stable  Post vital signs: Reviewed and stable  Complications: No apparent anesthesia complications

## 2016-11-09 NOTE — Brief Op Note (Signed)
11/09/2016  2:32 PM  PATIENT:  Sean Murray  61 y.o. male  PRE-OPERATIVE DIAGNOSIS:  BLADDER MASS  POST-OPERATIVE DIAGNOSIS:  BLADDER MASS  PROCEDURE:  Procedure(s): CYSTOSCOPY WITH RETROGRADE PYELOGRAM (Bilateral) CYSTOSCOPY WITH  BLADDER BIOPSY, FULGERATION (N/A)  SURGEON:  Surgeon(s) and Role:    * Cleon Gustin, MD - Primary  PHYSICIAN ASSISTANT:   ASSISTANTS: none   ANESTHESIA:   general  EBL:  Total I/O In: 200 [I.V.:200] Out: 0   BLOOD ADMINISTERED:none  DRAINS: none   LOCAL MEDICATIONS USED:  NONE  SPECIMEN:  Source of Specimen:  left lateral wall biopsy  DISPOSITION OF SPECIMEN:  PATHOLOGY  COUNTS:  YES  TOURNIQUET:  * No tourniquets in log *  DICTATION: .Note written in EPIC  PLAN OF CARE: Discharge to home after PACU  PATIENT DISPOSITION:  PACU - hemodynamically stable.   Delay start of Pharmacological VTE agent (>24hrs) due to surgical blood loss or risk of bleeding: not applicable

## 2016-11-09 NOTE — H&P (Signed)
Urology Admission H&P  Chief Complaint: urinary urgency  History of Present Illness: Sean Murray is a 61yo with a bladder mass  Past Medical History:  Diagnosis Date  . Bladder mass    noted per ct 10/ 2017  . Cholelithiasis    one small gallstone noted per ct 10/ 2017   . CKD (chronic kidney disease), stage III    PER PCP NOTE DR HUSAIN (last GFR 52)  --11/06/2016  . Down syndrome    high function  . Family history of liver cancer    both of his sister's died from liver cancer  . GERD (gastroesophageal reflux disease)   . Gouty arthritis    last flare-up  approx. 11/ 2017  . History of Helicobacter pylori infection    followed by dr Michail Sermon [facility])  . HOH (hard of hearing)   . Hyperlipidemia   . Left bundle branch block (LBBB)    per pcp note dr Lysle Rubens LBBB  has been a known before (11-06-2016)  . Lives in independent group home pt lived at home w/ parents until deceased then his 2 sisters were his caregivers until they both decreased -- he now lives at independent group home since 2017--   [name] is name of home (independent w/ guidance, communicates well) and is monitored medically by [facility]  . OA (osteoarthritis)    Past Surgical History:  Procedure Laterality Date  . COLONOSCOPY  03/13/2010    Home Medications:  Prescriptions Prior to Admission  Medication Sig Dispense Refill Last Dose  . allopurinol (ZYLOPRIM) 300 MG tablet Take 300 mg by mouth every morning.   11/09/2016 at 0700  . Lutein 20 MG TABS Take 1 tablet by mouth 2 (two) times daily.    11/09/2016 at 0700  . Multiple Vitamins-Minerals (MULTIVITAMIN WITH MINERALS) tablet Take 1 tablet by mouth every morning.    11/09/2016 at 0700  . ranitidine (ZANTAC) 300 MG capsule Take 300 mg by mouth every evening.   11/08/2016 at Unknown time  . simvastatin (ZOCOR) 20 MG tablet Take 20 mg by mouth every evening.    11/08/2016 at Unknown time  . acetaminophen (TYLENOL) 500 MG tablet Take 500 mg by mouth every 6  (six) hours as needed.   Unknown at Unknown time  . colchicine 0.6 MG tablet Take 0.6 mg by mouth daily.   Unknown at Unknown time  . triamcinolone cream (KENALOG) 0.1 % Apply 1 application topically 2 (two) times daily as needed.   Unknown at Unknown time   Allergies:  Allergies  Allergen Reactions  . Amoxicillin Rash    Severe rash  . Nsaids Other (See Comments)    Avoid NSAIDS--  Strong family hx liver cancer    History reviewed. No pertinent family history. Social History:  reports that he has never smoked. He has never used smokeless tobacco. He reports that he does not drink alcohol or use drugs.  Review of Systems  All other systems reviewed and are negative.   Physical Exam:  Vital signs in last 24 hours: Temp:  [97.5 F (36.4 C)] 97.5 F (36.4 C) (02/12 1158) Pulse Rate:  [64] 64 (02/12 1158) Resp:  [18] 18 (02/12 1158) BP: (96)/(70) 96/70 (02/12 1158) SpO2:  [100 %] 100 % (02/12 1158) Weight:  [62.6 kg (138 lb)] 62.6 kg (138 lb) (02/12 1158) Physical Exam  Constitutional: He is oriented to person, place, and time. He appears well-developed and well-nourished.  HENT:  Head: Normocephalic and atraumatic.  Eyes:  EOM are normal. Pupils are equal, round, and reactive to light.  Neck: Normal range of motion. No thyromegaly present.  Cardiovascular: Normal rate and regular rhythm.   Respiratory: Effort normal. No respiratory distress.  GI: Soft. He exhibits no distension.  Musculoskeletal: Normal range of motion. He exhibits no edema.  Neurological: He is alert and oriented to person, place, and time.  Skin: Skin is warm and dry.  Psychiatric: He has a normal mood and affect. His behavior is normal. Judgment and thought content normal.    Laboratory Data:  Results for orders placed or performed during the hospital encounter of 11/09/16 (from the past 24 hour(s))  I-STAT, chem 8     Status: Abnormal   Collection Time: 11/09/16 12:26 PM  Result Value Ref Range    Sodium 142 135 - 145 mmol/L   Potassium 4.1 3.5 - 5.1 mmol/L   Chloride 102 101 - 111 mmol/L   BUN 26 (H) 6 - 20 mg/dL   Creatinine, Ser 1.40 (H) 0.61 - 1.24 mg/dL   Glucose, Bld 82 65 - 99 mg/dL   Calcium, Ion 1.23 1.15 - 1.40 mmol/L   TCO2 28 0 - 100 mmol/L   Hemoglobin 13.9 13.0 - 17.0 g/dL   HCT 41.0 39.0 - 52.0 %   No results found for this or any previous visit (from the past 240 hour(s)). Creatinine:  Recent Labs  11/09/16 1226  CREATININE 1.40*   Baseline Creatinine: Unknown  Impression/Assessment:  60yo with bladder mass  Plan:  The risks/benefits/alternatives to bladder biopsy was explained to the POA and they understand and wish to proceed with surgery  Nicolette Bang 11/09/2016, 1:40 PM

## 2016-11-09 NOTE — Anesthesia Procedure Notes (Signed)
Procedure Name: LMA Insertion Date/Time: 11/09/2016 2:12 PM Performed by: Denna Haggard D Pre-anesthesia Checklist: Patient identified, Emergency Drugs available, Suction available and Patient being monitored Patient Re-evaluated:Patient Re-evaluated prior to inductionOxygen Delivery Method: Circle system utilized Preoxygenation: Pre-oxygenation with 100% oxygen Intubation Type: IV induction Ventilation: Mask ventilation without difficulty LMA: LMA inserted LMA Size: 4.0 Number of attempts: 1 Airway Equipment and Method: Bite block Placement Confirmation: positive ETCO2 Tube secured with: Tape Dental Injury: Teeth and Oropharynx as per pre-operative assessment

## 2016-11-09 NOTE — Anesthesia Postprocedure Evaluation (Signed)
Anesthesia Post Note  Patient: Sean Murray  Procedure(s) Performed: Procedure(s) (LRB): CYSTOSCOPY WITH RETROGRADE PYELOGRAM (Bilateral) CYSTOSCOPY WITH  BLADDER BIOPSY, FULGERATION (N/A)  Patient location during evaluation: PACU Anesthesia Type: General Level of consciousness: sedated Pain management: satisfactory to patient Vital Signs Assessment: post-procedure vital signs reviewed and stable Respiratory status: spontaneous breathing Cardiovascular status: stable Anesthetic complications: no       Last Vitals:  Vitals:   11/09/16 1500 11/09/16 1600  BP: 106/71 114/75  Pulse: 73 76  Resp: (!) 7 12  Temp:  36.6 C    Last Pain:  Vitals:   11/09/16 1500  TempSrc:   PainSc: 0-No pain                 Azyriah Nevins EDWARD

## 2016-11-09 NOTE — Discharge Instructions (Signed)
°  Post Anesthesia Home Care Instructions ° °Activity: °Get plenty of rest for the remainder of the day. A responsible adult should stay with you for 24 hours following the procedure.  °For the next 24 hours, DO NOT: °-Drive a car °-Operate machinery °-Drink alcoholic beverages °-Take any medication unless instructed by your physician °-Make any legal decisions or sign important papers. ° °Meals: °Start with liquid foods such as gelatin or soup. Progress to regular foods as tolerated. Avoid greasy, spicy, heavy foods. If nausea and/or vomiting occur, drink only clear liquids until the nausea and/or vomiting subsides. Call your physician if vomiting continues. ° °Special Instructions/Symptoms: °Your throat may feel dry or sore from the anesthesia or the breathing tube placed in your throat during surgery. If this causes discomfort, gargle with warm salt water. The discomfort should disappear within 24 hours. ° °If you had a scopolamine patch placed behind your ear for the management of post- operative nausea and/or vomiting: ° °1. The medication in the patch is effective for 72 hours, after which it should be removed.  Wrap patch in a tissue and discard in the trash. Wash hands thoroughly with soap and water. °2. You may remove the patch earlier than 72 hours if you experience unpleasant side effects which may include dry mouth, dizziness or visual disturbances. °3. Avoid touching the patch. Wash your hands with soap and water after contact with the patch. °  °CYSTOSCOPY HOME CARE INSTRUCTIONS ° °Activity: °Rest for the remainder of the day.  Do not drive or operate equipment today.  You may resume normal activities in one to two days as instructed by your physician.  ° °Meals: °Drink plenty of liquids and eat light foods such as gelatin or soup this evening.  You may return to a normal meal plan tomorrow. ° °Return to Work: °You may return to work in one to two days or as instructed by your physician. ° °Special  Instructions / Symptoms: °Call your physician if any of these symptoms occur: ° ° -persistent or heavy bleeding ° -bleeding which continues after first few urination ° -large blood clots that are difficult to pass ° -urine stream diminishes or stops completely ° -fever equal to or higher than 101 degrees Farenheit. ° -cloudy urine with a strong, foul odor ° -severe pain ° °Females should always wipe from front to back after elimination.  You may feel some burning pain when you urinate.  This should disappear with time.  Applying moist heat to the lower abdomen or a hot tub bath may help relieve the pain. \ ° °Follow-Up / Date of Return Visit to Your Physician:   °Call for an appointment to arrange follow-up. ° °Patient Signature:  ________________________________________________________ ° °Nurse's Signature:  ________________________________________________________ ° °

## 2016-11-09 NOTE — Anesthesia Preprocedure Evaluation (Addendum)
Anesthesia Evaluation  Patient identified by MRN, date of birth, ID band Patient awake  General Assessment Comment:Limited understanding, history from POA bro-in-law over phone  Reviewed: Allergy & Precautions, NPO status , Patient's Chart, lab work & pertinent test results  History of Anesthesia Complications Negative for: history of anesthetic complications  Airway Mallampati: II  TM Distance: >3 FB Neck ROM: Full    Dental  (+) Chipped, Dental Advisory Given   Pulmonary neg pulmonary ROS,    breath sounds clear to auscultation       Cardiovascular (-) angina Rhythm:Regular Rate:Normal  H/o LBBB: old, neg stress test 2005   Neuro/Psych    GI/Hepatic Neg liver ROS, GERD  Medicated and Controlled,  Endo/Other  negative endocrine ROS  Renal/GU Renal InsufficiencyRenal disease     Musculoskeletal  (+) Arthritis , Osteoarthritis,    Abdominal   Peds  (+) mental retardation Hematology negative hematology ROS (+)   Anesthesia Other Findings Down's syndrome: significant developmental delay, lives in group home  Reproductive/Obstetrics                           Anesthesia Physical Anesthesia Plan  ASA: III  Anesthesia Plan: General   Post-op Pain Management:    Induction: Intravenous  Airway Management Planned: LMA  Additional Equipment:   Intra-op Plan:   Post-operative Plan:   Informed Consent: I have reviewed the patients History and Physical, chart, labs and discussed the procedure including the risks, benefits and alternatives for the proposed anesthesia with the patient or authorized representative who has indicated his/her understanding and acceptance.   Dental advisory given  Plan Discussed with: CRNA and Surgeon  Anesthesia Plan Comments: (Plan routine monitors, GA-LMA OK Discussed with Bro-in-law, Grayce Sessions, who relates pt has had no significant medical issues,  specifically no heart symptoms or neck issues .  Mr Jenne Campus gives consent for pt to have general anesthesia today)        Anesthesia Quick Evaluation

## 2016-11-10 ENCOUNTER — Encounter (HOSPITAL_BASED_OUTPATIENT_CLINIC_OR_DEPARTMENT_OTHER): Payer: Self-pay | Admitting: Urology

## 2016-11-16 NOTE — Op Note (Signed)
Preoperative diagnosis: bladder mass  Postoperative diagnosis: Same  Procedure: 1 cystoscopy 2. bilateral retrograde pyelography 3. Intraoperative fluoroscopy, under one hour, with interpretation 4. Bladder biopsy with fulgeration  Attending: Nicolette Bang  Anesthesia: General  Estimated blood loss: Minimal  Drains: none  Specimens:  Bladder biopsies x 1  Antibiotics: ancef  Findings: left lateral wall erythematous lesion. Ureteral orifices in normal anatomic location. No hydronephrosis or filling defects in either collecting system  Indications: Patient is a 61 year old male with a history of a bladder mass of CT scan. After discussing treatment options, the power of attorney elected to proceed with bladder biopsy  Procedure her in detail: The patient was brought to the operating room and a brief timeout was done to ensure correct patient, correct procedure, correct site. General anesthesia was administered patient was placed in dorsal lithotomy position. Their genitalia was then prepped and draped in usual sterile fashion. A rigid 36 French cystoscope was passed in the urethra and the bladder. Bladder was inspected and we noted a small 0.5cm left lateral wall erythematous lesion. the ureteral orifices were in the normal orthotopic locations. a 6 french ureteral catheter was then instilled into the left ureteral orifice. a gentle retrograde was obtained and findings noted above. We then turned our attention to the right side. a 6 french ureteral catheter was then instilled into the right ureteral orifice. a gentle retrograde was obtained and findings noted above. We then removed the cystoscope and placed a resectoscope into the bladder. We proceeded to obtain a biopsy from the left lateral wall lesion. Hemostasis was then obtained with a bugbee. the bladder was then drained  and this concluded the procedure which was well tolerated by patient.  Complications:  None  Condition: Stable, extubated, transferred to PACU  Plan: Patient will be discharged home and will followup in 1 week for a pathology discussion

## 2016-11-26 DIAGNOSIS — D414 Neoplasm of uncertain behavior of bladder: Secondary | ICD-10-CM | POA: Diagnosis not present

## 2016-12-11 ENCOUNTER — Encounter (HOSPITAL_COMMUNITY): Payer: Self-pay

## 2016-12-11 DIAGNOSIS — M1990 Unspecified osteoarthritis, unspecified site: Secondary | ICD-10-CM | POA: Diagnosis not present

## 2016-12-11 DIAGNOSIS — T493X1A Poisoning by emollients, demulcents and protectants, accidental (unintentional), initial encounter: Secondary | ICD-10-CM | POA: Insufficient documentation

## 2016-12-11 DIAGNOSIS — N183 Chronic kidney disease, stage 3 (moderate): Secondary | ICD-10-CM | POA: Insufficient documentation

## 2016-12-11 DIAGNOSIS — E7800 Pure hypercholesterolemia, unspecified: Secondary | ICD-10-CM | POA: Diagnosis not present

## 2016-12-11 DIAGNOSIS — T496X1A Poisoning by otorhinolaryngological drugs and preparations, accidental (unintentional), initial encounter: Secondary | ICD-10-CM | POA: Diagnosis not present

## 2016-12-11 DIAGNOSIS — Y638 Failure in dosage during other surgical and medical care: Secondary | ICD-10-CM | POA: Diagnosis not present

## 2016-12-11 DIAGNOSIS — R634 Abnormal weight loss: Secondary | ICD-10-CM | POA: Diagnosis not present

## 2016-12-11 DIAGNOSIS — I872 Venous insufficiency (chronic) (peripheral): Secondary | ICD-10-CM | POA: Diagnosis not present

## 2016-12-11 DIAGNOSIS — M109 Gout, unspecified: Secondary | ICD-10-CM | POA: Diagnosis not present

## 2016-12-11 DIAGNOSIS — K219 Gastro-esophageal reflux disease without esophagitis: Secondary | ICD-10-CM | POA: Diagnosis not present

## 2016-12-11 DIAGNOSIS — Q909 Down syndrome, unspecified: Secondary | ICD-10-CM | POA: Diagnosis not present

## 2016-12-11 DIAGNOSIS — R21 Rash and other nonspecific skin eruption: Secondary | ICD-10-CM | POA: Diagnosis not present

## 2016-12-11 LAB — CBC
HEMATOCRIT: 37.4 % — AB (ref 39.0–52.0)
HEMOGLOBIN: 12.5 g/dL — AB (ref 13.0–17.0)
MCH: 34.1 pg — ABNORMAL HIGH (ref 26.0–34.0)
MCHC: 33.4 g/dL (ref 30.0–36.0)
MCV: 101.9 fL — ABNORMAL HIGH (ref 78.0–100.0)
Platelets: 166 10*3/uL (ref 150–400)
RBC: 3.67 MIL/uL — ABNORMAL LOW (ref 4.22–5.81)
RDW: 13.9 % (ref 11.5–15.5)
WBC: 6.1 10*3/uL (ref 4.0–10.5)

## 2016-12-11 LAB — BASIC METABOLIC PANEL
ANION GAP: 8 (ref 5–15)
BUN: 19 mg/dL (ref 6–20)
CALCIUM: 8.7 mg/dL — AB (ref 8.9–10.3)
CHLORIDE: 100 mmol/L — AB (ref 101–111)
CO2: 30 mmol/L (ref 22–32)
Creatinine, Ser: 1.25 mg/dL — ABNORMAL HIGH (ref 0.61–1.24)
GFR calc Af Amer: >60 mL/min (ref >60)
GFR calc non Af Amer: >60 mL/min (ref >60)
GLUCOSE: 83 mg/dL (ref 65–99)
Potassium: 3.9 mmol/L (ref 3.5–5.1)
Sodium: 138 mmol/L (ref 135–145)

## 2016-12-11 NOTE — ED Triage Notes (Signed)
Pt ingested appx 14oz of Listerine, pt recently has had elevated liver enzymes and GI consult. Nurse from facility spoke with poison control and they advised that he come here to be seen. Pt is from [facility]

## 2016-12-12 ENCOUNTER — Emergency Department (HOSPITAL_COMMUNITY)
Admission: EM | Admit: 2016-12-12 | Discharge: 2016-12-12 | Disposition: A | Discharge status: Home / Self Care | Payer: Medicare Other | Attending: Emergency Medicine | Admitting: Emergency Medicine

## 2016-12-12 DIAGNOSIS — T50901A Poisoning by unspecified drugs, medicaments and biological substances, accidental (unintentional), initial encounter: Secondary | ICD-10-CM

## 2016-12-12 NOTE — ED Notes (Addendum)
Pt's group home program manager states the pt drank some listerine. Group home nurse wanted pt evaluated.   Pt is acting normal per group home staff.

## 2016-12-12 NOTE — Discharge Instructions (Signed)
YOU CAN BE DISCHARGED HOME AND SHOULD BE FOLLOWED BY YOUR DOCTOR IF YOU DEVELOP ANY SYMPTOMS OF ABDOMINAL PAIN, EXCESSIVE VOMITING, CHEST PAIN.

## 2016-12-16 NOTE — ED Provider Notes (Signed)
[facility] DEPT Provider Note   CSN: [id number] Arrival date & time: 12/11/16  2059     History   Chief Complaint Chief Complaint  Patient presents with  . ingestion    HPI Sean Murray is a 61 y.o. male.  Patient with a history of Down's Syndrome, CKD, GERD, HLD, LBBB who lives in a group home presents with caregiver who reports that the patient is believed to have ingested a bottle of Listerine mouthwash. An empty bottle was found in his room. Unknown how full the bottle was initially. There is no report of vomiting, altered mental status or complaint of pain.   The history is provided by a caregiver. No language interpreter was used.    Past Medical History:  Diagnosis Date  . Bladder mass    noted per ct 10/ 2017  . Cholelithiasis    one small gallstone noted per ct 10/ 2017   . CKD (chronic kidney disease), stage III    PER PCP NOTE DR HUSAIN (last GFR 52)  --11/06/2016  . Down syndrome    high function  . Family history of liver cancer    both of his sister's died from liver cancer  . GERD (gastroesophageal reflux disease)   . Gouty arthritis    last flare-up  approx. 11/ 2017  . History of Helicobacter pylori infection    followed by dr Michail Sermon [facility])  . HOH (hard of hearing)   . Hyperlipidemia   . Left bundle branch block (LBBB)    per pcp note dr Lysle Rubens LBBB  has been a known before (11-06-2016)  . Lives in independent group home pt lived at home w/ parents until deceased then his 2 sisters were his caregivers until they both decreased -- he now lives at independent group home since 2017--   [name] is name of home (independent w/ guidance, communicates well) and is monitored medically by [employer]  . OA (osteoarthritis)     There are no active problems to display for this patient.   Past Surgical History:  Procedure Laterality Date  . COLONOSCOPY  03/13/2010  . CYSTOSCOPY W/ RETROGRADES Bilateral 11/09/2016   Procedure: CYSTOSCOPY  WITH RETROGRADE PYELOGRAM;  Surgeon: Cleon Gustin, MD;  Location: [facility];  Service: Urology;  Laterality: Bilateral;  . CYSTOSCOPY WITH BIOPSY N/A 11/09/2016   Procedure: CYSTOSCOPY WITH  BLADDER BIOPSY, FULGERATION;  Surgeon: Cleon Gustin, MD;  Location: [facility];  Service: Urology;  Laterality: N/A;       Home Medications    Prior to Admission medications   Medication Sig Start Date End Date Taking? Authorizing Provider  acetaminophen (TYLENOL) 500 MG tablet Take 500 mg by mouth every 6 (six) hours as needed.    Historical Provider, MD  allopurinol (ZYLOPRIM) 300 MG tablet Take 300 mg by mouth every morning.    Historical Provider, MD  colchicine 0.6 MG tablet Take 0.6 mg by mouth daily.    Historical Provider, MD  Lutein 20 MG TABS Take 1 tablet by mouth 2 (two) times daily.     Historical Provider, MD  Multiple Vitamins-Minerals (MULTIVITAMIN WITH MINERALS) tablet Take 1 tablet by mouth every morning.     Historical Provider, MD  ranitidine (ZANTAC) 300 MG capsule Take 300 mg by mouth every evening.    Historical Provider, MD  simvastatin (ZOCOR) 20 MG tablet Take 20 mg by mouth every evening.     Historical Provider, MD  traMADol (ULTRAM) 50 MG  tablet Take 1 tablet (50 mg total) by mouth every 6 (six) hours as needed. 11/09/16   Cleon Gustin, MD  triamcinolone cream (KENALOG) 0.1 % Apply 1 application topically 2 (two) times daily as needed.    Historical Provider, MD    Family History No family history on file.  Social History Social History  Substance Use Topics  . Smoking status: Never Smoker  . Smokeless tobacco: Never Used  . Alcohol use No     Allergies   Amoxicillin and Nsaids   Review of Systems Review of Systems  Unable to perform ROS: Other (Mental retardation)     Physical Exam Updated Vital Signs BP 113/65 (BP Location: Right Arm)   Pulse 72   Temp 97.9 F (36.6 C) (Oral)   Resp 18   SpO2 99%    Physical Exam  Constitutional: He appears well-developed and well-nourished. No distress.  Smiling, interactive.   HENT:  Mouth/Throat: Oropharynx is clear and moist.  Neck: Normal range of motion. Neck supple.  Cardiovascular: Normal rate and regular rhythm.   Pulmonary/Chest: Effort normal. He has no wheezes. He has no rales.  Abdominal: Soft. There is no tenderness.  Musculoskeletal: Normal range of motion.  Neurological: He is alert. Coordination normal.     ED Treatments / Results  Labs (all labs ordered are listed, but only abnormal results are displayed) Labs Reviewed  CBC - Abnormal; Notable for the following:       Result Value   RBC 3.67 (*)    Hemoglobin 12.5 (*)    HCT 37.4 (*)    MCV 101.9 (*)    MCH 34.1 (*)    All other components within normal limits  BASIC METABOLIC PANEL - Abnormal; Notable for the following:    Chloride 100 (*)    Creatinine, Ser 1.25 (*)    Calcium 8.7 (*)    All other components within normal limits    EKG  EKG Interpretation None       Radiology No results found.  Procedures Procedures (including critical care time)  Medications Ordered in ED Medications - No data to display   Initial Impression / Assessment and Plan / ED Course  I have reviewed the triage vital signs and the nursing notes.  Pertinent labs & imaging results that were available during my care of the patient were reviewed by me and considered in my medical decision making (see chart for details).     Patient brought in by caregiver for evaluation after ingesting an unknown quantity of Listerine. No complaints. Per poison control the patient can be discharged home without further evaluation.   Final Clinical Impressions(s) / ED Diagnoses   Final diagnoses:  Accidental drug overdose, initial encounter    New Prescriptions Discharge Medication List as of 12/12/2016  2:43 AM       Charlann Lange, PA-C 64/40/34 7425    Delora Fuel,  MD 95/63/87 5643

## 2016-12-18 ENCOUNTER — Other Ambulatory Visit: Payer: Self-pay | Admitting: Gastroenterology

## 2016-12-21 ENCOUNTER — Encounter (HOSPITAL_COMMUNITY): Payer: Self-pay | Admitting: *Deleted

## 2016-12-21 NOTE — Progress Notes (Signed)
Spoke with [employer] sykes he will not be coming for procedure he can be reached at home/work number [phone] am of procedure for consent.

## 2016-12-22 ENCOUNTER — Encounter (HOSPITAL_COMMUNITY): Payer: Self-pay | Admitting: *Deleted

## 2016-12-22 NOTE — Progress Notes (Signed)
Preop instructions for:  Sean Murray  Date of Birth   [birth year]                         Date of Procedure:  12-23-2016     Doctor: DR Michail Sermon Time to arrive at [facility]: 800AM Report to: Admitting  Procedure: EGD Any procedure time changes, MD office will notify you!   Do not eat or drink past midnight the night before your procedure.(To include any tube feedings-must be discontinued)  Take these morning medications only with sips of water.[place] RN                  Phone:   [phone]               Health Care Sean Murray PHONE [phone] CALL POA AT THIS NUMBER MORNING OF PROCEDURE TO GET CONSENT  Transportation contact phone#: FACILITY TO PROVIDE CAREGIVER TO BRING AND STAY WITH PATIENT  Please send day of procedure:current med list and meds last taken that day, confirm nothing by mouth status from what time, Patient Demographic info( to include DNR status, problem list, allergies)   RN contact name/phone#:Sean Murray [phone]                             and Fax #[phone]  [place] card and picture ID Leave all jewelry and other valuables at place where living( no metal or rings to be worn) No contact lens Women-no make-up, no lotions,perfumes,powders Men-no colognes,lotions  Any questions day of procedure,call Endoscopy unit-[phone]!   Sent from :[facility]                   Phone:[phone]                   Fax:[phone]  Sent by :Zelphia Cairo RN

## 2016-12-22 NOTE — Progress Notes (Signed)
Spoke with jacqueline murphy rn at [facility] all faxed instructions were received and understood for 12-23-16 procedure.

## 2016-12-23 ENCOUNTER — Ambulatory Visit (HOSPITAL_COMMUNITY): Payer: Medicare Other | Admitting: Anesthesiology

## 2016-12-23 ENCOUNTER — Ambulatory Visit (HOSPITAL_COMMUNITY)
Admission: RE | Admit: 2016-12-23 | Discharge: 2016-12-23 | Disposition: A | Discharge status: Home / Self Care | Payer: Medicare Other | Source: Ambulatory Visit | Attending: Gastroenterology | Admitting: Gastroenterology

## 2016-12-23 ENCOUNTER — Encounter (HOSPITAL_COMMUNITY): Payer: Self-pay | Admitting: *Deleted

## 2016-12-23 ENCOUNTER — Encounter (HOSPITAL_COMMUNITY)
Admission: RE | Disposition: A | Discharge status: Home / Self Care | Payer: Self-pay | Source: Ambulatory Visit | Attending: Gastroenterology

## 2016-12-23 DIAGNOSIS — Z79899 Other long term (current) drug therapy: Secondary | ICD-10-CM | POA: Diagnosis not present

## 2016-12-23 DIAGNOSIS — E785 Hyperlipidemia, unspecified: Secondary | ICD-10-CM | POA: Diagnosis not present

## 2016-12-23 DIAGNOSIS — K219 Gastro-esophageal reflux disease without esophagitis: Secondary | ICD-10-CM | POA: Insufficient documentation

## 2016-12-23 DIAGNOSIS — I129 Hypertensive chronic kidney disease with stage 1 through stage 4 chronic kidney disease, or unspecified chronic kidney disease: Secondary | ICD-10-CM | POA: Insufficient documentation

## 2016-12-23 DIAGNOSIS — Q394 Esophageal web: Secondary | ICD-10-CM | POA: Insufficient documentation

## 2016-12-23 DIAGNOSIS — K3189 Other diseases of stomach and duodenum: Secondary | ICD-10-CM | POA: Insufficient documentation

## 2016-12-23 DIAGNOSIS — R1084 Generalized abdominal pain: Secondary | ICD-10-CM | POA: Diagnosis not present

## 2016-12-23 DIAGNOSIS — R933 Abnormal findings on diagnostic imaging of other parts of digestive tract: Secondary | ICD-10-CM | POA: Diagnosis not present

## 2016-12-23 DIAGNOSIS — K449 Diaphragmatic hernia without obstruction or gangrene: Secondary | ICD-10-CM | POA: Insufficient documentation

## 2016-12-23 DIAGNOSIS — M109 Gout, unspecified: Secondary | ICD-10-CM | POA: Insufficient documentation

## 2016-12-23 DIAGNOSIS — N183 Chronic kidney disease, stage 3 (moderate): Secondary | ICD-10-CM | POA: Diagnosis not present

## 2016-12-23 DIAGNOSIS — R109 Unspecified abdominal pain: Secondary | ICD-10-CM | POA: Insufficient documentation

## 2016-12-23 DIAGNOSIS — R198 Other specified symptoms and signs involving the digestive system and abdomen: Secondary | ICD-10-CM | POA: Diagnosis present

## 2016-12-23 DIAGNOSIS — K319 Disease of stomach and duodenum, unspecified: Secondary | ICD-10-CM | POA: Diagnosis not present

## 2016-12-23 HISTORY — DX: Unspecified acquired deformity of unspecified lower leg: M21.969

## 2016-12-23 HISTORY — PX: ESOPHAGOGASTRODUODENOSCOPY (EGD) WITH PROPOFOL: SHX5813

## 2016-12-23 HISTORY — DX: Abnormal electrocardiogram (ECG) (EKG): R94.31

## 2016-12-23 SURGERY — ESOPHAGOGASTRODUODENOSCOPY (EGD) WITH PROPOFOL
Anesthesia: Monitor Anesthesia Care

## 2016-12-23 MED ORDER — SODIUM CHLORIDE 0.9 % IV SOLN: INTRAVENOUS | Status: DC

## 2016-12-23 MED ORDER — PROPOFOL 500 MG/50ML IV EMUL
INTRAVENOUS | Status: DC | PRN
  Administered 2016-12-23: 100 ug/kg/min via INTRAVENOUS

## 2016-12-23 MED ORDER — PROPOFOL 10 MG/ML IV BOLUS
INTRAVENOUS | Status: AC
  Filled 2016-12-23: qty 20

## 2016-12-23 MED ORDER — LIDOCAINE 2% (20 MG/ML) 5 ML SYRINGE
INTRAMUSCULAR | Status: AC
  Filled 2016-12-23: qty 10

## 2016-12-23 MED ORDER — PROPOFOL 10 MG/ML IV BOLUS
INTRAVENOUS | Status: DC | PRN
  Administered 2016-12-23 (×4): 20 mg via INTRAVENOUS

## 2016-12-23 MED ORDER — LIDOCAINE 2% (20 MG/ML) 5 ML SYRINGE
INTRAMUSCULAR | Status: DC | PRN
  Administered 2016-12-23: 100 mg via INTRAVENOUS

## 2016-12-23 MED ORDER — LACTATED RINGERS IV SOLN
INTRAVENOUS | Status: DC
  Administered 2016-12-23: 1000 mL via INTRAVENOUS

## 2016-12-23 SURGICAL SUPPLY — 14 items

## 2016-12-23 NOTE — H&P (Signed)
Date of Initial H&P: 12/16/16  History reviewed, patient examined, no change in status, stable for surgery.

## 2016-12-23 NOTE — Transfer of Care (Signed)
Immediate Anesthesia Transfer of Care Note  Patient: Sean Murray  Procedure(s) Performed: Procedure(s): ESOPHAGOGASTRODUODENOSCOPY (EGD) WITH PROPOFOL (N/A)  Patient Location: PACU  Anesthesia Type:MAC  Level of Consciousness: Patient sedated, comfortable,responds to stimulation.   Airway & Oxygen Therapy: Patient spontaneously breathing, ventilating well, oxygen via simple oxygen mask.  Post-op Assessment: Report given to PACU RN, vital signs reviewed and stable, moving all extremities.   Post vital signs: Reviewed and stable.  Complications: No apparent anesthesia complications Last Vitals:  Vitals:   12/23/16 0831 12/23/16 0951  BP: 123/65 (!) 80/46  Pulse: (!) 55 (!) 54  Resp: 12 10  Temp: 36.4 C     Last Pain:  Vitals:   12/23/16 0831  TempSrc: Oral         Complications: No apparent anesthesia complications

## 2016-12-23 NOTE — Progress Notes (Signed)
Spoke with Grayce Sessions (POA) regarding procedure purpose, risks, and process.  POA consented for procedure and also witnessed by Chryl Heck., RN.  POA had no further questions.

## 2016-12-23 NOTE — Anesthesia Preprocedure Evaluation (Signed)
Anesthesia Evaluation  Patient identified by MRN, date of birth, ID bandGeneral Assessment Comment:Down's syndrome: significant developmental delay, lives in group home   Reviewed: Unable to perform ROS - Chart review only  Airway Mallampati: II  TM Distance: >3 FB Neck ROM: Full    Dental  (+) Chipped   Pulmonary neg pulmonary ROS,    Pulmonary exam normal breath sounds clear to auscultation       Cardiovascular Normal cardiovascular exam Rhythm:Regular Rate:Normal     Neuro/Psych negative neurological ROS  negative psych ROS   GI/Hepatic Neg liver ROS, GERD  ,  Endo/Other  negative endocrine ROS  Renal/GU Renal InsufficiencyRenal disease  negative genitourinary   Musculoskeletal negative musculoskeletal ROS (+)   Abdominal   Peds negative pediatric ROS (+)  Hematology negative hematology ROS (+)   Anesthesia Other Findings   Reproductive/Obstetrics negative OB ROS                             Anesthesia Physical Anesthesia Plan  ASA: III  Anesthesia Plan: MAC   Post-op Pain Management:    Induction: Intravenous  Airway Management Planned: Simple Face Mask  Additional Equipment:   Intra-op Plan:   Post-operative Plan:   Informed Consent: I have reviewed the patients History and Physical, chart, labs and discussed the procedure including the risks, benefits and alternatives for the proposed anesthesia with the patient or authorized representative who has indicated his/her understanding and acceptance.   Dental advisory given  Plan Discussed with: CRNA and Surgeon  Anesthesia Plan Comments:         Anesthesia Quick Evaluation

## 2016-12-23 NOTE — Discharge Instructions (Signed)
Will call when biopsy results are complete.

## 2016-12-23 NOTE — Op Note (Signed)
[facility] Patient Name: Sean Murray Procedure Date: 12/23/2016 MRN: [id number] Attending MD: Lear Ng , MD Date of Birth: 19-Nov-1955 CSN: [id number] Age: 61 Admit Type: Outpatient Procedure:                Upper GI endoscopy Indications:              Generalized abdominal pain, Abnormal CT of the GI                            tract Providers:                Lear Ng, MD, Zenon Mayo, RN,                            William Dalton, Technician Referring MD:              Medicines:                Propofol per Anesthesia, Monitored Anesthesia Care Complications:            No immediate complications. Estimated Blood Loss:     Estimated blood loss was minimal. Procedure:                Pre-Anesthesia Assessment:                           - Prior to the procedure, a History and Physical                            was performed, and patient medications and                            allergies were reviewed. The patient's tolerance of                            previous anesthesia was also reviewed. The risks                            and benefits of the procedure and the sedation                            options and risks were discussed with the patient.                            All questions were answered, and informed consent                            was obtained. Prior Anticoagulants: The patient has                            taken no previous anticoagulant or antiplatelet                            agents. ASA Grade Assessment: III - A patient with  severe systemic disease. After reviewing the risks                            and benefits, the patient was deemed in                            satisfactory condition to undergo the procedure.                           After obtaining informed consent, the endoscope was                            passed under direct vision. Throughout the   procedure, the patient's blood pressure, pulse, and                            oxygen saturations were monitored continuously. The                            [id number] ([id number]) scope was introduced through the                            mouth, and advanced to the second part of duodenum.                            The upper GI endoscopy was somewhat difficult due                            to a proximal esophageal web. Successful completion                            of the procedure was aided by changing to a                            pediatric endoscope. The patient tolerated the                            procedure well. 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 Moderate resistance       occurred during passage of the pediatric endoscope through the web and       no sign of dilation of the web occurred.      The exam of the esophagus was otherwise normal.      The Z-line was regular and was found 38 cm from the incisors.      The entire examined stomach was normal.      A small hiatal hernia was present.      Localized nodular mucosa was found in the ampulla. Biopsies were taken       near the ampulla with a cold forceps for histology. Estimated blood loss       was minimal.  The exam of the duodenum was otherwise normal. 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                           - Z-line regular, 38 cm from the incisors.                           - Normal stomach.                           - Small hiatal hernia.                           - Nodular mucosa in the ampulla. Biopsied. Moderate Sedation:      N/A- Per Anesthesia Care Recommendation:           - Patient has a contact number available for                            emergencies. The signs and symptoms of  potential                            delayed complications were discussed with the                            patient. Return to normal activities tomorrow.                            Written discharge instructions were provided to the                            patient.                           - Await pathology results.                           - If dysphagia develops, then will need esophageal                            dilation.                           - Resume previous diet.                           - Continue present medications. Procedure Code(s):        --- Professional ---                           [phone], Esophagogastroduodenoscopy, flexible,                            transoral; with biopsy, single or multiple Diagnosis Code(s):        --- Professional ---  R10.84, Generalized abdominal pain                           R93.3, Abnormal findings on diagnostic imaging of                            other parts of digestive tract                           K31.89, Other diseases of stomach and duodenum                           Q39.4, Esophageal web                           K44.9, Diaphragmatic hernia without obstruction or                            gangrene CPT copyright 2016 [employer]. All rights reserved. The codes documented in this report are preliminary and upon coder review may  be revised to meet current compliance requirements. Lear Ng, MD 12/23/2016 10:10:20 AM This report has been signed electronically. Number of Addenda: 0

## 2016-12-23 NOTE — Interval H&P Note (Signed)
History and Physical Interval Note:  12/23/2016 8:27 AM  Sean Murray  has presented today for surgery, with the diagnosis of abd pain  The various methods of treatment have been discussed with the patient and family. After consideration of risks, benefits and other options for treatment, the patient has consented to  Procedure(s): ESOPHAGOGASTRODUODENOSCOPY (EGD) WITH PROPOFOL (N/A) as a surgical intervention .  The patient's history has been reviewed, patient examined, no change in status, stable for surgery.  I have reviewed the patient's chart and labs.  Questions were answered to the patient's satisfaction.     [place] C.

## 2016-12-23 NOTE — Anesthesia Postprocedure Evaluation (Addendum)
Anesthesia Post Note  Patient: Sean Murray  Procedure(s) Performed: Procedure(s) (LRB): ESOPHAGOGASTRODUODENOSCOPY (EGD) WITH PROPOFOL (N/A)  Patient location during evaluation: PACU Anesthesia Type: MAC Level of consciousness: awake and alert Pain management: pain level controlled Vital Signs Assessment: post-procedure vital signs reviewed and stable Respiratory status: spontaneous breathing, nonlabored ventilation, respiratory function stable and patient connected to nasal cannula oxygen Cardiovascular status: stable and blood pressure returned to baseline Anesthetic complications: no       Last Vitals:  Vitals:   12/23/16 0831 12/23/16 0951  BP: 123/65 (!) 80/46  Pulse: (!) 55 (!) 54  Resp: 12 10  Temp: 36.4 C 36.4 C    Last Pain:  Vitals:   12/23/16 0951  TempSrc: Oral                 Kyndle Schlender S

## 2016-12-30 DIAGNOSIS — H25013 Cortical age-related cataract, bilateral: Secondary | ICD-10-CM | POA: Diagnosis not present

## 2017-01-13 ENCOUNTER — Ambulatory Visit (INDEPENDENT_AMBULATORY_CARE_PROVIDER_SITE_OTHER): Payer: Medicare Other | Admitting: Podiatry

## 2017-01-13 DIAGNOSIS — M79609 Pain in unspecified limb: Secondary | ICD-10-CM | POA: Diagnosis not present

## 2017-01-13 DIAGNOSIS — L84 Corns and callosities: Secondary | ICD-10-CM | POA: Diagnosis not present

## 2017-01-13 DIAGNOSIS — B351 Tinea unguium: Secondary | ICD-10-CM | POA: Diagnosis not present

## 2017-01-13 DIAGNOSIS — L851 Acquired keratosis [keratoderma] palmaris et plantaris: Secondary | ICD-10-CM

## 2017-01-13 NOTE — Progress Notes (Signed)
Patient ID: Sean Murray, male   DOB: 19-Mar-1956, 61 y.o.   MRN: [id number]    Subjective: This Down syndrome patient presents with caregiver in the treatment room or schedule visit with a history of painful toenails when walking wearing shoes and a history of multiple keratoses and right and left feet  Objective: Patient is pleasant but confused and difficult to understand DP and PT pulses palpable bilaterally No peripheral edema bilaterally Patient is reactive to direct palpation in the toenails and keratoses No open skin lesions bilaterally Atrophic skin with absent hair growth bilaterally Brown hyperpigmentation skin bilaterally Toenails are elongated, brittle, deformed, discolored 6-10 with reactive tenderness to direct palpation Keratoses medial fifth left toe, dorsal second toes bilaterally and nucleated keratoses plantar fifth right MPJ Advanced HAV bilaterally Hammertoe 2-5 bilaterally Pes cavus bilaterally  Assessment: Symptomatic onychomycoses 6-10 Corns/calluses bilaterally  Plan: Debridement of toenails 6-10 mechanical and electrical without any bleeding Debrided multiple your toes is right and left feet without any bleeding  Reappoint at three-month intervals for skin a nail debridement

## 2017-01-13 NOTE — Patient Instructions (Signed)
Return every 3 months for debridement of mycotic toenails and keratoses on the right and left feet  Gean Birchwood DPM [facility] April 18,018

## 2017-01-29 ENCOUNTER — Encounter (HOSPITAL_COMMUNITY): Payer: Self-pay | Admitting: *Deleted

## 2017-01-29 ENCOUNTER — Emergency Department (HOSPITAL_COMMUNITY)
Admission: EM | Admit: 2017-01-29 | Discharge: 2017-01-29 | Disposition: A | Discharge status: Home / Self Care | Payer: Medicare Other | Attending: Emergency Medicine | Admitting: Emergency Medicine

## 2017-01-29 ENCOUNTER — Emergency Department (HOSPITAL_COMMUNITY): Payer: Medicare Other

## 2017-01-29 DIAGNOSIS — W19XXXA Unspecified fall, initial encounter: Secondary | ICD-10-CM | POA: Insufficient documentation

## 2017-01-29 DIAGNOSIS — Y999 Unspecified external cause status: Secondary | ICD-10-CM | POA: Insufficient documentation

## 2017-01-29 DIAGNOSIS — N183 Chronic kidney disease, stage 3 (moderate): Secondary | ICD-10-CM | POA: Diagnosis not present

## 2017-01-29 DIAGNOSIS — Y9389 Activity, other specified: Secondary | ICD-10-CM | POA: Diagnosis not present

## 2017-01-29 DIAGNOSIS — S01511A Laceration without foreign body of lip, initial encounter: Secondary | ICD-10-CM | POA: Insufficient documentation

## 2017-01-29 DIAGNOSIS — S0083XA Contusion of other part of head, initial encounter: Secondary | ICD-10-CM | POA: Diagnosis not present

## 2017-01-29 DIAGNOSIS — Y929 Unspecified place or not applicable: Secondary | ICD-10-CM | POA: Diagnosis not present

## 2017-01-29 DIAGNOSIS — F0390 Unspecified dementia without behavioral disturbance: Secondary | ICD-10-CM | POA: Diagnosis not present

## 2017-01-29 DIAGNOSIS — S0990XA Unspecified injury of head, initial encounter: Secondary | ICD-10-CM | POA: Diagnosis not present

## 2017-01-29 DIAGNOSIS — S299XXA Unspecified injury of thorax, initial encounter: Secondary | ICD-10-CM | POA: Diagnosis not present

## 2017-01-29 LAB — CBC WITH DIFFERENTIAL/PLATELET
BASOS ABS: 0 10*3/uL (ref 0.0–0.1)
Basophils Relative: 0 %
Eosinophils Absolute: 0 10*3/uL (ref 0.0–0.7)
Eosinophils Relative: 0 %
HEMATOCRIT: 41.5 % (ref 39.0–52.0)
HEMOGLOBIN: 14 g/dL (ref 13.0–17.0)
LYMPHS ABS: 0.5 10*3/uL — AB (ref 0.7–4.0)
LYMPHS PCT: 4 %
MCH: 34.5 pg — ABNORMAL HIGH (ref 26.0–34.0)
MCHC: 33.7 g/dL (ref 30.0–36.0)
MCV: 102.2 fL — AB (ref 78.0–100.0)
Monocytes Absolute: 0.6 10*3/uL (ref 0.1–1.0)
Monocytes Relative: 4 %
NEUTROS ABS: 14 10*3/uL — AB (ref 1.7–7.7)
Neutrophils Relative %: 92 %
Platelets: 181 10*3/uL (ref 150–400)
RBC: 4.06 MIL/uL — AB (ref 4.22–5.81)
RDW: 13.3 % (ref 11.5–15.5)
WBC: 15.2 10*3/uL — AB (ref 4.0–10.5)

## 2017-01-29 LAB — URINALYSIS, ROUTINE W REFLEX MICROSCOPIC
Bilirubin Urine: NEGATIVE
GLUCOSE, UA: NEGATIVE mg/dL
Hgb urine dipstick: NEGATIVE
Ketones, ur: NEGATIVE mg/dL
LEUKOCYTES UA: NEGATIVE
Nitrite: NEGATIVE
PH: 5 (ref 5.0–8.0)
Protein, ur: NEGATIVE mg/dL
SPECIFIC GRAVITY, URINE: 1.02 (ref 1.005–1.030)

## 2017-01-29 LAB — BASIC METABOLIC PANEL
ANION GAP: 9 (ref 5–15)
BUN: 24 mg/dL — ABNORMAL HIGH (ref 6–20)
CHLORIDE: 103 mmol/L (ref 101–111)
CO2: 25 mmol/L (ref 22–32)
Calcium: 9.1 mg/dL (ref 8.9–10.3)
Creatinine, Ser: 1.54 mg/dL — ABNORMAL HIGH (ref 0.61–1.24)
GFR calc Af Amer: 54 mL/min — ABNORMAL LOW (ref >60)
GFR, EST NON AFRICAN AMERICAN: 47 mL/min — AB (ref >60)
GLUCOSE: 129 mg/dL — AB (ref 65–99)
POTASSIUM: 4.6 mmol/L (ref 3.5–5.1)
Sodium: 137 mmol/L (ref 135–145)

## 2017-01-29 MED ORDER — LIDOCAINE-EPINEPHRINE-TETRACAINE (LET) SOLUTION
3.0000 mL | Freq: Once | NASAL | Status: AC
Start: 2017-01-29 — End: 2017-01-29
  Administered 2017-01-29: 12:00:00 3 mL via TOPICAL
  Filled 2017-01-29: qty 3

## 2017-01-29 MED ORDER — SODIUM CHLORIDE 0.9 % IV BOLUS (SEPSIS)
1000.0000 mL | Freq: Once | INTRAVENOUS | Status: AC
  Administered 2017-01-29: 1000 mL via INTRAVENOUS

## 2017-01-29 NOTE — ED Notes (Signed)
Patient transported to X-ray 

## 2017-01-29 NOTE — ED Notes (Signed)
Inn and out with [employer] EMT

## 2017-01-29 NOTE — Discharge Instructions (Signed)
You have absorbable sutures in place, these will come out on their own in 3-5 days.   If they come out earlier, it is ok, the mouth heals very well.

## 2017-01-29 NOTE — ED Triage Notes (Signed)
To ED via pov for eval after falling this am. Unknown if pt tripped or became dizzy. No hx of falls. Pt lives in group home. Caregiver accompanied pt to ED. With noted small lac to left upper lip. Redness to right forehead. Pt alert to baseline per caregiver.

## 2017-02-01 NOTE — ED Provider Notes (Signed)
[facility] DEPT Provider Note   CSN: [id number] Arrival date & time: 01/29/17  5176     History   Chief Complaint Chief Complaint  Patient presents with  . Fall    HPI Sean Murray is a 61 y.o. male.   Fall  This is a new problem. The problem occurs constantly. Pertinent negatives include no chest pain. Nothing aggravates the symptoms. Nothing relieves the symptoms.    Past Medical History:  Diagnosis Date  . Abnormal EKG    HX LEFT BUNDLE BRANCH BLOCK ON 01-11-2004 EKG ON CHART, NORMAL STRESS TEST 2005  . Bladder mass    noted per ct 10/ 2017  . Cholelithiasis    one small gallstone noted per ct 10/ 2017   . CKD (chronic kidney disease), stage III    PER PCP NOTE DR HUSAIN (last GFR 52)  --11/06/2016 CKD STAGE 3   . Down syndrome    high function  . Family history of liver cancer    both of his sister's died from liver cancer  . Foot deformity   . GERD (gastroesophageal reflux disease)   . Gout   . Gouty arthritis    last flare-up  approx. 11/ 2017  . History of Helicobacter pylori infection    followed by dr Michail Sermon [facility])  . HOH (hard of hearing)   . Hyperlipidemia   . Left bundle branch block (LBBB)    per pcp note dr Lysle Rubens LBBB  has been a known before (11-06-2016)  . Lives in independent group home pt lived at home w/ parents until deceased then his 2 sisters were his caregivers until they both decreased -- he now lives at independent group home since 2017--   [name] is name of home (independent w/ guidance, communicates well) and is monitored medically by [employer]  . OA (osteoarthritis)     Patient Active Problem List   Diagnosis Date Noted  . Generalized abdominal pain 12/23/2016  . Abnormal findings-gastrointestinal tract 12/23/2016    Past Surgical History:  Procedure Laterality Date  . COLONOSCOPY  03/13/2010  . CYSTOSCOPY W/ RETROGRADES Bilateral 11/09/2016   Procedure: CYSTOSCOPY WITH RETROGRADE PYELOGRAM;  Surgeon: Cleon Gustin, MD;  Location: [facility];  Service: Urology;  Laterality: Bilateral;  . CYSTOSCOPY WITH BIOPSY N/A 11/09/2016   Procedure: CYSTOSCOPY WITH  BLADDER BIOPSY, FULGERATION;  Surgeon: Cleon Gustin, MD;  Location[facility];  Service: Urology;  Laterality: N/A;  . ESOPHAGOGASTRODUODENOSCOPY (EGD) WITH PROPOFOL N/A 12/23/2016   Procedure: ESOPHAGOGASTRODUODENOSCOPY (EGD) WITH PROPOFOL;  Surgeon: Wilford Corner, MD;  Location: [facility];  Service: Endoscopy;  Laterality: N/A;       Home Medications    Prior to Admission medications   Medication Sig Start Date End Date Taking? Authorizing Provider  allopurinol (ZYLOPRIM) 300 MG tablet Take 300 mg by mouth every morning.   Yes [provider]  Lutein 20 MG TABS Take 1 tablet by mouth 2 (two) times daily.    Yes [provider]  Multiple Vitamins-Minerals (MULTIVITAMIN WITH MINERALS) tablet Take 1 tablet by mouth every morning.    Yes [provider]  ranitidine (ZANTAC) 300 MG capsule Take 300 mg by mouth at bedtime.    Yes [provider]  simvastatin (ZOCOR) 20 MG tablet Take 20 mg by mouth every evening.    Yes [provider]  acetaminophen (TYLENOL) 500 MG tablet Take 500-1,000 mg by mouth every 6 (six) hours as needed for moderate  pain.     [provider]  colchicine 0.6 MG tablet Take 0.6 mg by mouth 2 (two) times daily as needed (gout flare). For up to 7 days    [provider]  triamcinolone cream (KENALOG) 0.1 % Apply 1 application topically 2 (two) times daily as needed (rash).     [provider]    Family History No family history on file.  Social History Social History  Substance Use Topics  . Smoking status: Never Smoker  . Smokeless tobacco: Never Used  . Alcohol use No     Allergies   Amoxicillin and Nsaids   Review of Systems Review of Systems  Cardiovascular: Negative for chest pain.  All  other systems reviewed and are negative.    Physical Exam Updated Vital Signs BP 107/90   Pulse (!) 111   Temp 97.9 F (36.6 C) (Oral)   Resp 18   SpO2 99%   Physical Exam  Constitutional: He appears well-developed and well-nourished.  HENT:  Head: Normocephalic and atraumatic.  Mouth/Throat: Lacerations (outer lip through and through) present.    Eyes: Conjunctivae and EOM are normal.  Neck: Normal range of motion.  Cardiovascular: Normal rate.   Pulmonary/Chest: Effort normal. No respiratory distress.  Abdominal: He exhibits no distension.  Musculoskeletal: Normal range of motion.  Neurological: He is alert.  Nursing note and vitals reviewed.    ED Treatments / Results  Labs (all labs ordered are listed, but only abnormal results are displayed) Labs Reviewed  CBC WITH DIFFERENTIAL/PLATELET - Abnormal; Notable for the following:       Result Value   WBC 15.2 (*)    RBC 4.06 (*)    MCV 102.2 (*)    MCH 34.5 (*)    Neutro Abs 14.0 (*)    Lymphs Abs 0.5 (*)    All other components within normal limits  BASIC METABOLIC PANEL - Abnormal; Notable for the following:    Glucose, Bld 129 (*)    BUN 24 (*)    Creatinine, Ser 1.54 (*)    GFR calc non Af Amer 47 (*)    GFR calc Af Amer 54 (*)    All other components within normal limits  URINALYSIS, ROUTINE W REFLEX MICROSCOPIC - Abnormal; Notable for the following:    Color, Urine AMBER (*)    APPearance HAZY (*)    All other components within normal limits    EKG  EKG Interpretation None       Radiology No results found.  Procedures .Marland KitchenLaceration Repair Date/Time: 02/01/2017 12:27 PM Performed by: Merrily Pew Authorized by: Merrily Pew   Consent:    Consent obtained:  Verbal Anesthesia (see MAR for exact dosages):    Anesthesia method:  Topical application   Topical anesthetic:  LET Laceration details:    Location:  Lip   Length (cm):  1   Laceration depth: through to mouth. Repair type:     Repair type:  Complex Pre-procedure details:    Preparation:  Patient was prepped and draped in usual sterile fashion Exploration:    Limited defect created (wound extended): no     Wound exploration: wound explored through full range of motion     Wound extent: areolar tissue violated and fascia violated     Contaminated: no   Treatment:    Area cleansed with:  Betadine   Amount of cleaning:  Standard   Irrigation solution:  Sterile saline   Irrigation method:  Syringe  Visualized foreign bodies/material removed: no     Debridement:  None   Undermining:  None   Scar revision: no   Skin repair:    Repair method:  Sutures   Suture size:  3-0   Suture material:  Fast-absorbing gut   Suture technique:  Simple interrupted   Number of sutures:  3 Approximation:    Approximation:  Close   Vermilion border: well-aligned   Post-procedure details:    Dressing:  Open (no dressing)   Patient tolerance of procedure:  Tolerated well, no immediate complications   (including critical care time)  Medications Ordered in ED Medications  sodium chloride 0.9 % bolus 1,000 mL (0 mLs Intravenous Stopped 01/29/17 1225)  lidocaine-EPINEPHrine-tetracaine (LET) solution (3 mLs Topical Given 01/29/17 1137)  sodium chloride 0.9 % bolus 1,000 mL (0 mLs Intravenous Stopped 01/29/17 1446)     Initial Impression / Assessment and Plan / ED Course  I have reviewed the triage vital signs and the nursing notes.  Pertinent labs & imaging results that were available during my care of the patient were reviewed by me and considered in my medical decision making (see chart for details).     Fall and weakness likely secondary to dehydration. Improved significantly with fluids here. aptient stating he doesn't want to leave but has normal VS, able to ambulate without dfificulty. Tolerating PO. Stable for dc at this time.   Final Clinical Impressions(s) / ED Diagnoses   Final diagnoses:  Fall  Injury of head,  initial encounter  Lip laceration, initial encounter    New Prescriptions Discharge Medication List as of 01/29/2017  2:45 PM       Jayden Kratochvil, Corene Cornea, MD 02/01/17 1230

## 2017-02-08 DIAGNOSIS — E46 Unspecified protein-calorie malnutrition: Secondary | ICD-10-CM | POA: Diagnosis not present

## 2017-02-08 DIAGNOSIS — R531 Weakness: Secondary | ICD-10-CM | POA: Diagnosis not present

## 2017-02-08 DIAGNOSIS — W19XXXA Unspecified fall, initial encounter: Secondary | ICD-10-CM | POA: Diagnosis not present

## 2017-03-01 NOTE — Addendum Note (Signed)
Addendum  created 03/01/17 1247 by Myrtie Soman, MD   Sign clinical note

## 2017-03-03 DIAGNOSIS — K269 Duodenal ulcer, unspecified as acute or chronic, without hemorrhage or perforation: Secondary | ICD-10-CM | POA: Diagnosis not present

## 2017-03-03 DIAGNOSIS — R410 Disorientation, unspecified: Secondary | ICD-10-CM | POA: Diagnosis not present

## 2017-03-03 DIAGNOSIS — M109 Gout, unspecified: Secondary | ICD-10-CM | POA: Diagnosis not present

## 2017-03-03 DIAGNOSIS — N183 Chronic kidney disease, stage 3 (moderate): Secondary | ICD-10-CM | POA: Diagnosis not present

## 2017-03-03 DIAGNOSIS — R413 Other amnesia: Secondary | ICD-10-CM | POA: Diagnosis not present

## 2017-03-03 DIAGNOSIS — R7309 Other abnormal glucose: Secondary | ICD-10-CM | POA: Diagnosis not present

## 2017-03-03 DIAGNOSIS — Q909 Down syndrome, unspecified: Secondary | ICD-10-CM | POA: Diagnosis not present

## 2017-03-03 DIAGNOSIS — E7800 Pure hypercholesterolemia, unspecified: Secondary | ICD-10-CM | POA: Diagnosis not present

## 2017-03-03 DIAGNOSIS — I872 Venous insufficiency (chronic) (peripheral): Secondary | ICD-10-CM | POA: Diagnosis not present

## 2017-03-03 DIAGNOSIS — E46 Unspecified protein-calorie malnutrition: Secondary | ICD-10-CM | POA: Diagnosis not present

## 2017-04-09 NOTE — Addendum Note (Signed)
Addendum  created 04/09/17 1459 by Lyndle Herrlich, MD   Sign clinical note

## 2017-04-09 NOTE — Anesthesia Postprocedure Evaluation (Signed)
Anesthesia Post Note  Patient: Sean Murray  Procedure(s) Performed: Procedure(s) (LRB): CYSTOSCOPY WITH RETROGRADE PYELOGRAM (Bilateral) CYSTOSCOPY WITH  BLADDER BIOPSY, FULGERATION (N/A)     Anesthesia Post Evaluation  Last Vitals:  Vitals:   11/09/16 1500 11/09/16 1600  BP: 106/71 114/75  Pulse: 73 76  Resp: (!) 7 12  Temp:  36.6 C    Last Pain:  Vitals:   11/09/16 1500  TempSrc:   PainSc: 0-No pain                 Madaleine Simmon EDWARD

## 2017-04-13 ENCOUNTER — Encounter: Payer: Self-pay | Admitting: Neurology

## 2017-04-13 ENCOUNTER — Ambulatory Visit (INDEPENDENT_AMBULATORY_CARE_PROVIDER_SITE_OTHER): Payer: Medicare Other | Admitting: Neurology

## 2017-04-13 VITALS — BP 100/68 | HR 83

## 2017-04-13 DIAGNOSIS — E538 Deficiency of other specified B group vitamins: Secondary | ICD-10-CM | POA: Diagnosis not present

## 2017-04-13 DIAGNOSIS — Q909 Down syndrome, unspecified: Secondary | ICD-10-CM | POA: Diagnosis not present

## 2017-04-13 DIAGNOSIS — R413 Other amnesia: Secondary | ICD-10-CM

## 2017-04-13 DIAGNOSIS — R5382 Chronic fatigue, unspecified: Secondary | ICD-10-CM | POA: Diagnosis not present

## 2017-04-13 DIAGNOSIS — H903 Sensorineural hearing loss, bilateral: Secondary | ICD-10-CM

## 2017-04-13 DIAGNOSIS — H9190 Unspecified hearing loss, unspecified ear: Secondary | ICD-10-CM | POA: Insufficient documentation

## 2017-04-13 HISTORY — DX: Down syndrome, unspecified: Q90.9

## 2017-04-13 HISTORY — DX: Other amnesia: R41.3

## 2017-04-13 MED ORDER — DONEPEZIL HCL 5 MG PO TABS: 5.0000 mg | ORAL_TABLET | Freq: Every day | ORAL | 1 refills | Status: DC

## 2017-04-13 NOTE — Patient Instructions (Signed)
   We will get blood work today. We will start aricept for memory.  Begin Aricept (donepezil) at 5 mg at night for one month. If this medication is well-tolerated, please call our office and we will call in a prescription for the 10 mg tablets. Look out for side effects that may include nausea, diarrhea, weight loss, or stomach cramps. This medication will also cause a runny nose, therefore there is no need for allergy medications for this purpose.

## 2017-04-13 NOTE — Progress Notes (Signed)
Reason for visit: Memory disturbance  Referring physician: Dr. Beverlee Nims is a 61 y.o. male  History of present illness:  [name] is a 61 year old right-handed white male with a history of Down syndrome. The patient currently is living at an adult home situation. He has been there about one year, he comes in with a caretaker who has known him for about one year. The patient has had a decline in his ability to function independently. He does require assistance with bathing, he can dress himself. Over the last 2 months, he has begun to wander, he has had an event 3 weeks ago where he could not find the bathroom and urinated in an office. He has had difficulty understanding how to open a car door which she used to know how to do. The patient is able to feed himself. He may get up in the middle of the night and wander at times. The patient is eating relatively well, but he has lost some weight since being at the group home. The patient may eat a meal, and instead of taking his plate into the kitchen he will take it back to his bedroom. The patient does have some foot pain, he does not have any significant gait instability but he has fallen on one occasion. He is sent to this office for further evaluation. He has had a recent CT scan of the brain.  Past Medical History:  Diagnosis Date  . Abnormal EKG    HX LEFT BUNDLE BRANCH BLOCK ON 01-11-2004 EKG ON CHART, NORMAL STRESS TEST 2005  . Bladder mass    noted per ct 10/ 2017  . Cholelithiasis    one small gallstone noted per ct 10/ 2017   . CKD (chronic kidney disease), stage III    PER PCP NOTE DR HUSAIN (last GFR 52)  --11/06/2016 CKD STAGE 3   . Down syndrome    high function  . Family history of liver cancer    both of his sister's died from liver cancer  . Foot deformity   . GERD (gastroesophageal reflux disease)   . Gout   . Gouty arthritis    last flare-up  approx. 11/ 2017  . History of Helicobacter pylori infection    followed by dr Michail Sermon [facility])  . HOH (hard of hearing)   . Hyperlipidemia   . Left bundle branch block (LBBB)    per pcp note dr Lysle Rubens LBBB  has been a known before (11-06-2016)  . Lives in independent group home pt lived at home w/ parents until deceased then his 2 sisters were his caregivers until they both decreased -- he now lives at independent group home since 2017--   [name] is name of home (independent w/ guidance, communicates well) and is monitored medically by [employer]  . OA (osteoarthritis)     Past Surgical History:  Procedure Laterality Date  . COLONOSCOPY  03/13/2010  . CYSTOSCOPY W/ RETROGRADES Bilateral 11/09/2016   Procedure: CYSTOSCOPY WITH RETROGRADE PYELOGRAM;  Surgeon: Cleon Gustin, MD;  Location: [facility];  Service: Urology;  Laterality: Bilateral;  . CYSTOSCOPY WITH BIOPSY N/A 11/09/2016   Procedure: CYSTOSCOPY WITH  BLADDER BIOPSY, FULGERATION;  Surgeon: Cleon Gustin, MD;  Location: [facility];  Service: Urology;  Laterality: N/A;  . ESOPHAGOGASTRODUODENOSCOPY (EGD) WITH PROPOFOL N/A 12/23/2016   Procedure: ESOPHAGOGASTRODUODENOSCOPY (EGD) WITH PROPOFOL;  Surgeon: Wilford Corner, MD;  Location: [facility];  Service: Endoscopy;  Laterality:  N/A;    No family history on file.  Social history:  reports that he has never smoked. He has never used smokeless tobacco. He reports that he does not drink alcohol or use drugs.  Medications:  Prior to Admission medications   Medication Sig Start Date End Date Taking? Authorizing Provider  acetaminophen (TYLENOL) 500 MG tablet Take 500-1,000 mg by mouth every 6 (six) hours as needed for moderate pain.    Yes [provider]  allopurinol (ZYLOPRIM) 300 MG tablet Take 300 mg by mouth every morning.   Yes [provider]  colchicine 0.6 MG tablet Take 0.6 mg by mouth 2 (two) times daily as needed (gout flare). For up to 7 days   Yes [provider]  ENSURE PLUS (ENSURE PLUS) LIQD Take 237 mLs by mouth 2 (two) times daily between meals.   Yes [provider]  Lutein 20 MG TABS Take 1 tablet by mouth 2 (two) times daily.    Yes [provider]  Multiple Vitamins-Minerals (MULTIVITAMIN WITH MINERALS) tablet Take 1 tablet by mouth every morning.    Yes [provider]  ranitidine (ZANTAC) 300 MG capsule Take 300 mg by mouth at bedtime.    Yes [provider]  simvastatin (ZOCOR) 20 MG tablet Take 20 mg by mouth every evening.    Yes [provider]  triamcinolone cream (KENALOG) 0.1 % Apply 1 application topically 2 (two) times daily as needed (rash).    Yes [provider]      Allergies  Allergen Reactions  . Amoxicillin Rash    Unsure wasn't listed on [facility] 01/29/17.  Severe rash  . Nsaids Other (See Comments)    Avoid NSAIDS--  Strong family hx liver cancer    ROS:  Out of a complete 14 system review of symptoms, the patient complains only of the following symptoms, and all other reviewed systems are negative.  Weight loss Hearing loss Cognitive changes  Blood pressure 100/68, pulse 83, SpO2 95 %.  Physical Exam  General: The patient is alert and cooperative at the time of the examination.  Eyes: Pupils are equal, round, and reactive to light. Discs are flat bilaterally.  Neck: The neck is supple, no carotid bruits are noted.  Respiratory: The respiratory examination is clear.  Cardiovascular: The cardiovascular examination reveals a regular rate and rhythm, no obvious murmurs or rubs are noted.  Skin: Extremities are without significant edema.  Neurologic Exam  Mental status: The patient is alert and cooperative, the patient is oriented to name only, he would not cooperate for Mini-Mental status examination testing.  Cranial nerves: Facial symmetry is present. There is good sensation of the face to pinprick and soft touch bilaterally. The strength  of the facial muscles and the muscles to head turning and shoulder shrug are normal bilaterally. Speech is well enunciated, no aphasia or dysarthria is noted. Extraocular movements are full. Visual fields are full to threat. The tongue is midline, and the patient has symmetric elevation of the soft palate. No obvious hearing deficits are noted.  Motor: The motor testing reveals 5 over 5 strength of all 4 extremities. Good symmetric motor tone is noted throughout.  Sensory: Sensory testing is intact to deep pain stimulation on all 4 extremities.  Coordination: Cerebellar testing is difficult, the patient is unable to follow verbal commands. No obvious ataxia of the extremities is seen.  Gait and station: Gait is minimally wide-based, the patient walk independently. Romberg is negative.  Reflexes: Deep tendon reflexes are symmetric and normal bilaterally. Toes are downgoing bilaterally.   CT head and cervical 01/29/17:  IMPRESSION: Advanced atrophy.  Chronic small vessel disease.  No skull fracture or intracranial hemorrhage.  No facial fracture or layering sinus fluid.  * CT scan images were reviewed online. I agree with the written report.    Assessment/Plan:  1. Down syndrome  2. Progressive cognitive decline  The patient likely has Alzheimer's disease process associated with Down syndrome. The patient will be sent for blood work today, he will be placed on Aricept, he will follow-up in 6 months. If he continues to lose weight, the medication will need to be discontinued.    Jill Alexanders MD 04/13/2017 9:35 AM  [facility] [address] [place], [place] [address]  Phone [phone] Fax [phone]

## 2017-04-14 ENCOUNTER — Ambulatory Visit (INDEPENDENT_AMBULATORY_CARE_PROVIDER_SITE_OTHER): Payer: Medicare Other | Admitting: Podiatry

## 2017-04-14 ENCOUNTER — Telehealth: Payer: Self-pay | Admitting: *Deleted

## 2017-04-14 ENCOUNTER — Ambulatory Visit: Payer: Medicare Other | Admitting: Podiatry

## 2017-04-14 ENCOUNTER — Encounter: Payer: Self-pay | Admitting: Podiatry

## 2017-04-14 DIAGNOSIS — B351 Tinea unguium: Secondary | ICD-10-CM

## 2017-04-14 DIAGNOSIS — Q828 Other specified congenital malformations of skin: Secondary | ICD-10-CM

## 2017-04-14 DIAGNOSIS — M79676 Pain in unspecified toe(s): Secondary | ICD-10-CM | POA: Diagnosis not present

## 2017-04-14 LAB — VITAMIN B12: Vitamin B-12: 379 pg/mL (ref 232–1245)

## 2017-04-14 LAB — TSH: TSH: 5.58 u[IU]/mL — ABNORMAL HIGH (ref 0.450–4.500)

## 2017-04-14 NOTE — Telephone Encounter (Signed)
Called and spoke with Jackie(nurse) from group home. Relayed results per CW,MD note. Advised we will forward results to PCP Lysle Rubens, D[place] Ar, MD) for their records and so pt can f/u with them about abnormal TSH. She verbalized understanding.   Faxed copy of labs to PCP Dr. [name], MD at [phone]. Received confirmation.

## 2017-04-14 NOTE — Telephone Encounter (Signed)
-----   Message from Kathrynn Ducking, MD sent at 04/14/2017 12:58 PM EDT ----- Blood work shows mild elevation in TSH, slightly low thyroid, vitamin B-12 is normal. Please call the group home, I will send the blood work results to the primary care doctor. ----- Message ----- From: Lavone Neri Lab Results In Sent: 04/14/2017  11:58 AM To: Kathrynn Ducking, MD

## 2017-04-14 NOTE — Progress Notes (Signed)
   SUBJECTIVE Patient  presents to office today complaining of elongated, thickened nails. Pain while ambulating in shoes. Patient is unable to trim their own nails.   OBJECTIVE General Patient is awake, alert, and oriented x 3 and in no acute distress. Derm hyperkeratotic callus lesion also noted to the second digits bilateral. Skin is dry and supple bilateral. Negative open lesions or macerations. Remaining integument unremarkable. Nails are tender, long, thickened and dystrophic with subungual debris, consistent with onychomycosis, 1-5 bilateral. No signs of infection noted. Vasc  DP and PT pedal pulses palpable bilaterally. Temperature gradient within normal limits.  Neuro Epicritic and protective threshold sensation diminished bilaterally.  Musculoskeletal Exam No symptomatic pedal deformities noted bilateral. Muscular strength within normal limits.  ASSESSMENT 1. Onychodystrophic nails 1-5 bilateral with hyperkeratosis of nails.  2. Onychomycosis of nail due to dermatophyte bilateral 3. Pain in foot bilateral  PLAN OF CARE 1. Patient evaluated today.  2. Instructed to maintain good pedal hygiene and foot care.  3. Mechanical debridement of nails 1-5 bilaterally performed using a nail nipper. Filed with dremel without incident.  4. Excisional debridement of callus lesions was performed to the second digits bilaterally using a chisel blade without incident or bleeding  5. Return to clinic in 3 mos.    Edrick Kins, DPM [facility]  Dr. Edrick Kins, [place]                                        [place], [place] [address]                Office [phone]  Fax [phone]

## 2017-05-06 DIAGNOSIS — E7800 Pure hypercholesterolemia, unspecified: Secondary | ICD-10-CM | POA: Diagnosis not present

## 2017-05-06 DIAGNOSIS — N183 Chronic kidney disease, stage 3 (moderate): Secondary | ICD-10-CM | POA: Diagnosis not present

## 2017-05-06 DIAGNOSIS — E46 Unspecified protein-calorie malnutrition: Secondary | ICD-10-CM | POA: Diagnosis not present

## 2017-05-06 DIAGNOSIS — N33 Bladder disorders in diseases classified elsewhere: Secondary | ICD-10-CM | POA: Diagnosis not present

## 2017-05-06 DIAGNOSIS — R32 Unspecified urinary incontinence: Secondary | ICD-10-CM | POA: Diagnosis not present

## 2017-05-06 DIAGNOSIS — Q909 Down syndrome, unspecified: Secondary | ICD-10-CM | POA: Diagnosis not present

## 2017-05-06 DIAGNOSIS — F0390 Unspecified dementia without behavioral disturbance: Secondary | ICD-10-CM | POA: Diagnosis not present

## 2017-05-09 ENCOUNTER — Encounter (HOSPITAL_COMMUNITY): Payer: Self-pay

## 2017-05-09 ENCOUNTER — Emergency Department (HOSPITAL_COMMUNITY)
Admission: EM | Admit: 2017-05-09 | Discharge: 2017-05-09 | Disposition: A | Discharge status: Home / Self Care | Payer: Medicare Other | Attending: Emergency Medicine | Admitting: Emergency Medicine

## 2017-05-09 DIAGNOSIS — Y92192 Bathroom in other specified residential institution as the place of occurrence of the external cause: Secondary | ICD-10-CM | POA: Diagnosis not present

## 2017-05-09 DIAGNOSIS — N183 Chronic kidney disease, stage 3 (moderate): Secondary | ICD-10-CM | POA: Insufficient documentation

## 2017-05-09 DIAGNOSIS — S0181XA Laceration without foreign body of other part of head, initial encounter: Secondary | ICD-10-CM

## 2017-05-09 DIAGNOSIS — Q909 Down syndrome, unspecified: Secondary | ICD-10-CM | POA: Diagnosis not present

## 2017-05-09 DIAGNOSIS — Z880 Allergy status to penicillin: Secondary | ICD-10-CM | POA: Insufficient documentation

## 2017-05-09 DIAGNOSIS — Y93F1 Activity, caregiving, bathing: Secondary | ICD-10-CM | POA: Insufficient documentation

## 2017-05-09 DIAGNOSIS — Z79899 Other long term (current) drug therapy: Secondary | ICD-10-CM | POA: Diagnosis not present

## 2017-05-09 DIAGNOSIS — W0110XA Fall on same level from slipping, tripping and stumbling with subsequent striking against unspecified object, initial encounter: Secondary | ICD-10-CM | POA: Diagnosis not present

## 2017-05-09 DIAGNOSIS — S01112A Laceration without foreign body of left eyelid and periocular area, initial encounter: Secondary | ICD-10-CM | POA: Diagnosis not present

## 2017-05-09 DIAGNOSIS — Y999 Unspecified external cause status: Secondary | ICD-10-CM | POA: Diagnosis not present

## 2017-05-09 MED ORDER — LIDOCAINE-EPINEPHRINE (PF) 2 %-1:200000 IJ SOLN
10.0000 mL | Freq: Once | INTRAMUSCULAR | Status: AC
  Administered 2017-05-09: 10 mL
  Filled 2017-05-09: qty 20

## 2017-05-09 MED ORDER — LIDOCAINE-EPINEPHRINE-TETRACAINE (LET) SOLUTION
3.0000 mL | Freq: Once | NASAL | Status: AC
Start: 2017-05-09 — End: 2017-05-09
  Administered 2017-05-09: 20:00:00 3 mL via TOPICAL
  Filled 2017-05-09: qty 3

## 2017-05-09 MED ORDER — TETANUS-DIPHTH-ACELL PERTUSSIS 5-2.5-18.5 LF-MCG/0.5 IM SUSP
0.5000 mL | Freq: Once | INTRAMUSCULAR | Status: AC
Start: 2017-05-09 — End: 2017-05-09
  Administered 2017-05-09: 0.5 mL via INTRAMUSCULAR
  Filled 2017-05-09: qty 0.5

## 2017-05-09 NOTE — ED Triage Notes (Signed)
Called for triage no response 

## 2017-05-09 NOTE — ED Provider Notes (Signed)
[facility] DEPT Provider Note   CSN: [id number] Arrival date & time: 05/09/17  1726     History   Chief Complaint Chief Complaint  Patient presents with  . Laceration    HPI Sean Murray is a 61 y.o. male.  Pt presents to the ED from a group home.  He was taking a shower tonight and tripped and fell.  The pt has a hx of Down's Syndrome and is not a good historian.  Group home workers with patient do not report a loc.  Pt is not on blood thinners.  Pt denies any pain.  No n/v.      Past Medical History:  Diagnosis Date  . Abnormal EKG    HX LEFT BUNDLE BRANCH BLOCK ON 01-11-2004 EKG ON CHART, NORMAL STRESS TEST 2005  . Bladder mass    noted per ct 10/ 2017  . Cholelithiasis    one small gallstone noted per ct 10/ 2017   . CKD (chronic kidney disease), stage III    PER PCP NOTE DR HUSAIN (last GFR 52)  --11/06/2016 CKD STAGE 3   . Down syndrome    high function  . Down's syndrome 04/13/2017  . Family history of liver cancer    both of his sister's died from liver cancer  . Foot deformity   . GERD (gastroesophageal reflux disease)   . Gout   . Gouty arthritis    last flare-up  approx. 11/ 2017  . History of Helicobacter pylori infection    followed by dr Michail Sermon [facility])  . HOH (hard of hearing)   . Hyperlipidemia   . Left bundle branch block (LBBB)    per pcp note dr Lysle Rubens LBBB  has been a known before (11-06-2016)  . Lives in independent group home pt lived at home w/ parents until deceased then his 2 sisters were his caregivers until they both decreased -- he now lives at independent group home since 2017--   [name] is name of home (independent w/ guidance, communicates well) and is monitored medically by [employer]  . Memory disorder 04/13/2017  . OA (osteoarthritis)     Patient Active Problem List   Diagnosis Date Noted  . Down's syndrome 04/13/2017  . Memory disorder 04/13/2017  . HOH (hard of hearing) 04/13/2017  . Generalized abdominal  pain 12/23/2016  . Abnormal findings-gastrointestinal tract 12/23/2016    Past Surgical History:  Procedure Laterality Date  . COLONOSCOPY  03/13/2010  . CYSTOSCOPY W/ RETROGRADES Bilateral 11/09/2016   Procedure: CYSTOSCOPY WITH RETROGRADE PYELOGRAM;  Surgeon: Cleon Gustin, MD;  [facility];  Service: Urology;  Laterality: Bilateral;  . CYSTOSCOPY WITH BIOPSY N/A 11/09/2016   Procedure: CYSTOSCOPY WITH  BLADDER BIOPSY, FULGERATION;  Surgeon: Cleon Gustin, MD;  Location: [facility];  Service: Urology;  Laterality: N/A;  . ESOPHAGOGASTRODUODENOSCOPY (EGD) WITH PROPOFOL N/A 12/23/2016   Procedure: ESOPHAGOGASTRODUODENOSCOPY (EGD) WITH PROPOFOL;  Surgeon: Wilford Corner, MD;  Location: [facility];  Service: Endoscopy;  Laterality: N/A;       Home Medications    Prior to Admission medications   Medication Sig Start Date End Date Taking? Authorizing Provider  acetaminophen (TYLENOL) 500 MG tablet Take 500-1,000 mg by mouth every 6 (six) hours as needed for moderate pain.     [provider]  allopurinol (ZYLOPRIM) 300 MG tablet Take 300 mg by mouth every morning.    [provider]  colchicine 0.6 MG tablet Take 0.6 mg by mouth  2 (two) times daily as needed (gout flare). For up to 7 days    [provider]  donepezil (ARICEPT) 5 MG tablet Take 1 tablet (5 mg total) by mouth at bedtime. 04/13/17   Kathrynn Ducking, MD  ENSURE PLUS (ENSURE PLUS) LIQD Take 237 mLs by mouth 2 (two) times daily between meals.    [provider]  Lutein 20 MG TABS Take 1 tablet by mouth 2 (two) times daily.     [provider]  Multiple Vitamins-Minerals (MULTIVITAMIN WITH MINERALS) tablet Take 1 tablet by mouth every morning.     [provider]  ranitidine (ZANTAC) 300 MG capsule Take 300 mg by mouth at bedtime.     [provider]  simvastatin (ZOCOR) 20 MG tablet Take 20 mg by mouth every  evening.     [provider]  triamcinolone cream (KENALOG) 0.1 % Apply 1 application topically 2 (two) times daily as needed (rash).     [provider]    Family History History reviewed. No pertinent family history.  Social History Social History  Substance Use Topics  . Smoking status: Never Smoker  . Smokeless tobacco: Never Used  . Alcohol use No     Allergies   Amoxicillin and Nsaids   Review of Systems Review of Systems  Skin: Positive for wound.  All other systems reviewed and are negative.    Physical Exam Updated Vital Signs BP (!) 121/103 (BP Location: Right Arm)   Pulse 75   Temp 98 F (36.7 C) (Oral)   Resp 16   Ht 5\' 3"  (1.6 m)   Wt 59 kg (130 lb)   SpO2 99%   BMI 23.03 kg/m   Physical Exam  Constitutional: He appears well-developed and well-nourished.  HENT:  Head: Normocephalic.    Right Ear: External ear normal.  Left Ear: External ear normal.  Nose: Nose normal.  Mouth/Throat: Oropharynx is clear and moist.  Down's syndrome facies  Eyes: Pupils are equal, round, and reactive to light. Conjunctivae and EOM are normal.  Neck: Normal range of motion. Neck supple.  Cardiovascular: Normal rate, regular rhythm, normal heart sounds and intact distal pulses.   Pulmonary/Chest: Effort normal and breath sounds normal.  Abdominal: Soft. Bowel sounds are normal.  Musculoskeletal: Normal range of motion.  Neurological: He is alert.  Down's syndrome, but high functioning.  Normal mental status per group home workers.  Skin:     Nursing note and vitals reviewed.    ED Treatments / Results  Labs (all labs ordered are listed, but only abnormal results are displayed) Labs Reviewed - No data to display  EKG  EKG Interpretation None       Radiology No results found.  Procedures .Marland KitchenLaceration Repair Date/Time: 05/09/2017 8:05 PM Performed by: Isla Pence Authorized by: Isla Pence   Consent:    Consent  obtained:  Verbal   Consent given by:  Guardian   Risks discussed:  Infection, pain, poor cosmetic result and need for additional repair   Alternatives discussed:  No treatment Anesthesia (see MAR for exact dosages):    Anesthesia method:  Topical application and local infiltration   Topical anesthetic:  LET   Local anesthetic:  Lidocaine 2% WITH epi Laceration details:    Location:  Face   Face location:  L eyebrow   Length (cm):  4 Repair type:    Repair type:  Simple Pre-procedure details:    Preparation:  Patient was prepped and draped  in usual sterile fashion Exploration:    Hemostasis achieved with:  Epinephrine   Contaminated: no   Treatment:    Area cleansed with:  Saline   Amount of cleaning:  Standard   Irrigation solution:  Sterile saline Skin repair:    Repair method:  Sutures   Suture size:  6-0   Suture material:  Prolene   Suture technique:  Simple interrupted   Number of sutures:  4 Approximation:    Approximation:  Close   Vermilion border: well-aligned   Post-procedure details:    Dressing:  Antibiotic ointment and non-adherent dressing   Patient tolerance of procedure:  Tolerated well, no immediate complications   (including critical care time)  Medications Ordered in ED Medications  lidocaine-EPINEPHrine-tetracaine (LET) solution (not administered)  lidocaine-EPINEPHrine (XYLOCAINE W/EPI) 2 %-1:200000 (PF) injection 10 mL (not administered)     Initial Impression / Assessment and Plan / ED Course  I have reviewed the triage vital signs and the nursing notes.  Pertinent labs & imaging results that were available during my care of the patient were reviewed by me and considered in my medical decision making (see chart for details).  Pt has no indication for head CT.  He is going back to the group home where he will be watched.  Sutures to be removed in 5 to 7 days.  Final Clinical Impressions(s) / ED Diagnoses   Final diagnoses:  Laceration of  forehead, initial encounter    New Prescriptions New Prescriptions   No medications on file     Isla Pence, MD 05/09/17 2007

## 2017-05-09 NOTE — ED Triage Notes (Signed)
Pt brought in from group home. He fell, has approximately 1.5 inch lac above the left eye. Minimal bleeding. Controlled with gauze. Pt has hx of down syndrome. Pt does not take a blood thinner.

## 2017-05-14 ENCOUNTER — Encounter (HOSPITAL_COMMUNITY): Payer: Self-pay | Admitting: Nurse Practitioner

## 2017-05-14 ENCOUNTER — Emergency Department (HOSPITAL_COMMUNITY)
Admission: EM | Admit: 2017-05-14 | Discharge: 2017-05-14 | Disposition: A | Discharge status: Home / Self Care | Payer: Medicare Other | Attending: Emergency Medicine | Admitting: Emergency Medicine

## 2017-05-14 DIAGNOSIS — Z79899 Other long term (current) drug therapy: Secondary | ICD-10-CM | POA: Insufficient documentation

## 2017-05-14 DIAGNOSIS — S01112D Laceration without foreign body of left eyelid and periocular area, subsequent encounter: Secondary | ICD-10-CM | POA: Insufficient documentation

## 2017-05-14 DIAGNOSIS — Z4802 Encounter for removal of sutures: Secondary | ICD-10-CM

## 2017-05-14 DIAGNOSIS — W010XXA Fall on same level from slipping, tripping and stumbling without subsequent striking against object, initial encounter: Secondary | ICD-10-CM | POA: Diagnosis not present

## 2017-05-14 DIAGNOSIS — Q909 Down syndrome, unspecified: Secondary | ICD-10-CM | POA: Diagnosis not present

## 2017-05-14 DIAGNOSIS — N183 Chronic kidney disease, stage 3 (moderate): Secondary | ICD-10-CM | POA: Insufficient documentation

## 2017-05-14 NOTE — ED Provider Notes (Signed)
[facility] DEPT Provider Note   CSN: [id number] Arrival date & time: 05/14/17  1329     History   Chief Complaint Chief Complaint  Patient presents with  . Suture / Staple Removal    HPI Sean Murray is a 61 y.o. male.  The history is provided by the patient and medical records.  Suture / Staple Removal      61 y.o. F here with hx of down's syndrome, GERD, out, gallstones, OA here for suture removal.  Patient sustained laceration about 5 days ago after he fell in the shower.  Had laceration repaired here.  No issues with healing per group home staff.  Patient denies any complaints.  Past Medical History:  Diagnosis Date  . Abnormal EKG    HX LEFT BUNDLE BRANCH BLOCK ON 01-11-2004 EKG ON CHART, NORMAL STRESS TEST 2005  . Bladder mass    noted per ct 10/ 2017  . Cholelithiasis    one small gallstone noted per ct 10/ 2017   . CKD (chronic kidney disease), stage III    PER PCP NOTE DR HUSAIN (last GFR 52)  --11/06/2016 CKD STAGE 3   . Down syndrome    high function  . Down's syndrome 04/13/2017  . Family history of liver cancer    both of his sister's died from liver cancer  . Foot deformity   . GERD (gastroesophageal reflux disease)   . Gout   . Gouty arthritis    last flare-up  approx. 11/ 2017  . History of Helicobacter pylori infection    followed by dr Michail Sermon [facility])  . HOH (hard of hearing)   . Hyperlipidemia   . Left bundle branch block (LBBB)    per pcp note dr Lysle Rubens LBBB  has been a known before (11-06-2016)  . Lives in independent group home pt lived at home w/ parents until deceased then his 2 sisters were his caregivers until they both decreased -- he now lives at independent group home since 2017--   [name] is name of home (independent w/ guidance, communicates well) and is monitored medically by [employer]  . Memory disorder 04/13/2017  . OA (osteoarthritis)     Patient Active Problem List   Diagnosis Date Noted  . Down's syndrome  04/13/2017  . Memory disorder 04/13/2017  . HOH (hard of hearing) 04/13/2017  . Generalized abdominal pain 12/23/2016  . Abnormal findings-gastrointestinal tract 12/23/2016    Past Surgical History:  Procedure Laterality Date  . COLONOSCOPY  03/13/2010  . CYSTOSCOPY W/ RETROGRADES Bilateral 11/09/2016   Procedure: CYSTOSCOPY WITH RETROGRADE PYELOGRAM;  Surgeon: Cleon Gustin, MD;  Location: [facility];  Service: Urology;  Laterality: Bilateral;  . CYSTOSCOPY WITH BIOPSY N/A 11/09/2016   Procedure: CYSTOSCOPY WITH  BLADDER BIOPSY, FULGERATION;  Surgeon: Cleon Gustin, MD;  Location[facility];  Service: Urology;  Laterality: N/A;  . ESOPHAGOGASTRODUODENOSCOPY (EGD) WITH PROPOFOL N/A 12/23/2016   Procedure: ESOPHAGOGASTRODUODENOSCOPY (EGD) WITH PROPOFOL;  Surgeon: Wilford Corner, MD;  Location: [facility];  Service: Endoscopy;  Laterality: N/A;       Home Medications    Prior to Admission medications   Medication Sig Start Date End Date Taking? Authorizing Provider  acetaminophen (TYLENOL) 500 MG tablet Take 500-1,000 mg by mouth every 6 (six) hours as needed for moderate pain.     [provider]  allopurinol (ZYLOPRIM) 300 MG tablet Take 300 mg by mouth every morning.    [provider]  colchicine  0.6 MG tablet Take 0.6 mg by mouth 2 (two) times daily as needed (gout flare). For up to 7 days    [provider]  donepezil (ARICEPT) 5 MG tablet Take 1 tablet (5 mg total) by mouth at bedtime. 04/13/17   Kathrynn Ducking, MD  ENSURE PLUS (ENSURE PLUS) LIQD Take 237 mLs by mouth 2 (two) times daily between meals.    [provider]  Lutein 20 MG TABS Take 1 tablet by mouth 2 (two) times daily.     [provider]  Multiple Vitamins-Minerals (MULTIVITAMIN WITH MINERALS) tablet Take 1 tablet by mouth every morning.     [provider]  ranitidine (ZANTAC) 300 MG capsule Take 300 mg by mouth at  bedtime.     [provider]  simvastatin (ZOCOR) 20 MG tablet Take 20 mg by mouth every evening.     [provider]  triamcinolone cream (KENALOG) 0.1 % Apply 1 application topically 2 (two) times daily as needed (rash).     [provider]    Family History History reviewed. No pertinent family history.  Social History Social History  Substance Use Topics  . Smoking status: Never Smoker  . Smokeless tobacco: Never Used  . Alcohol use No     Allergies   Amoxicillin and Nsaids   Review of Systems Review of Systems  Skin: Positive for wound.  All other systems reviewed and are negative.    Physical Exam Updated Vital Signs BP 96/83   Pulse 73   Temp 98 F (36.7 C) (Oral)   Resp 18   SpO2 100%   Physical Exam  Constitutional: He is oriented to person, place, and time. He appears well-developed and well-nourished.  HENT:  Head: Normocephalic and atraumatic.  Mouth/Throat: Oropharynx is clear and moist.  4 intact prolene sutures to left eyebrow; no bleeding, drainage, or signs of infection  Eyes: Pupils are equal, round, and reactive to light. Conjunctivae and EOM are normal.  Neck: Normal range of motion.  Cardiovascular: Normal rate, regular rhythm and normal heart sounds.   Pulmonary/Chest: Effort normal and breath sounds normal.  Abdominal: Soft. Bowel sounds are normal.  Musculoskeletal: Normal range of motion.  Neurological: He is alert and oriented to person, place, and time.  Skin: Skin is warm and dry.  Psychiatric: He has a normal mood and affect.  Nursing note and vitals reviewed.    ED Treatments / Results  Labs (all labs ordered are listed, but only abnormal results are displayed) Labs Reviewed - No data to display  EKG  EKG Interpretation None       Radiology No results found.  Procedures .Suture Removal Date/Time: 05/14/2017 2:19 PM Performed by: Larene Pickett Authorized by: Larene Pickett   Consent:     Consent obtained:  Verbal   Consent given by:  Patient   Risks discussed:  Bleeding and pain   Alternatives discussed:  No treatment Location:    Location:  Head/neck   Head/neck location:  Eyebrow   Eyebrow location:  L eyebrow Procedure details:    Wound appearance:  No signs of infection   Number of sutures removed:  4 Post-procedure details:    Patient tolerance of procedure:  Tolerated well, no immediate complications   (including critical care time)  Medications Ordered in ED Medications - No data to display   Initial Impression / Assessment and Plan / ED Course  I have reviewed the triage vital signs and the  nursing notes.  Pertinent labs & imaging results that were available during my care of the patient were reviewed by me and considered in my medical decision making (see chart for details).  Wound has healed well.  Sutures removed here without issue.  No complications.  Appropriate for discharge home.  Can follow-up with PCP as needed  Final Clinical Impressions(s) / ED Diagnoses   Final diagnoses:  Visit for suture removal    New Prescriptions New Prescriptions   No medications on file     Larene Pickett, PA-C 05/14/17 1420    Larene Pickett, PA-C 05/14/17 [place], Wenda Overland, MD 05/15/17 2117

## 2017-05-14 NOTE — ED Triage Notes (Signed)
Pt here from group home for suture removal. He has sutures to his left eyebrow, placed here on 8/12. Healing well, no complaints

## 2017-05-18 DIAGNOSIS — N401 Enlarged prostate with lower urinary tract symptoms: Secondary | ICD-10-CM | POA: Diagnosis not present

## 2017-05-18 DIAGNOSIS — R350 Frequency of micturition: Secondary | ICD-10-CM | POA: Diagnosis not present

## 2017-07-02 DIAGNOSIS — H25013 Cortical age-related cataract, bilateral: Secondary | ICD-10-CM | POA: Diagnosis not present

## 2017-07-05 ENCOUNTER — Telehealth: Payer: Self-pay | Admitting: Podiatry

## 2017-07-05 ENCOUNTER — Encounter: Payer: Self-pay | Admitting: Podiatry

## 2017-07-05 ENCOUNTER — Ambulatory Visit (INDEPENDENT_AMBULATORY_CARE_PROVIDER_SITE_OTHER): Payer: Medicare Other

## 2017-07-05 ENCOUNTER — Ambulatory Visit (INDEPENDENT_AMBULATORY_CARE_PROVIDER_SITE_OTHER): Payer: Medicare Other | Admitting: Podiatry

## 2017-07-05 DIAGNOSIS — B351 Tinea unguium: Secondary | ICD-10-CM | POA: Diagnosis not present

## 2017-07-05 DIAGNOSIS — L97519 Non-pressure chronic ulcer of other part of right foot with unspecified severity: Secondary | ICD-10-CM | POA: Diagnosis not present

## 2017-07-05 DIAGNOSIS — M79676 Pain in unspecified toe(s): Secondary | ICD-10-CM

## 2017-07-05 DIAGNOSIS — M25571 Pain in right ankle and joints of right foot: Secondary | ICD-10-CM

## 2017-07-05 DIAGNOSIS — L97312 Non-pressure chronic ulcer of right ankle with fat layer exposed: Secondary | ICD-10-CM | POA: Diagnosis not present

## 2017-07-05 DIAGNOSIS — I83015 Varicose veins of right lower extremity with ulcer other part of foot: Secondary | ICD-10-CM

## 2017-07-05 NOTE — Telephone Encounter (Signed)
This is [place] for the home [name] stays at. He saw Dr. Amalia Hailey this morning and Dr. Amalia Hailey mentioned iodine but we did not get an Rx nor was there anything with the orders. I need some clarification. Please call me back either on my mobile [phone] or my work number [phone]. Thank you.

## 2017-07-06 NOTE — Telephone Encounter (Signed)
Paint wound with iodine daily and dress with bandaid.  Dr. Amalia Hailey

## 2017-07-06 NOTE — Telephone Encounter (Addendum)
Unable to leave a voicemail, [facility] Coordinator's voicemail box is full. 07/09/2017-I informed [facility] Coordinator of Dr. Amalia Hailey 07/06/2017 orders.

## 2017-07-06 NOTE — Progress Notes (Signed)
Subjective:  Patient presents today for evaluation of an ulceration to the right ankle that appeared about one week ago. He reports pain to the ulceration site. Patient has been caring for the wound at home. Patient presents today for further treatment and evaluation of the ulceration site.  Patient also presents to office today complaining of elongated, thickened nails. Pain while ambulating in shoes. He is unable to trim their own nails.    Past Medical History:  Diagnosis Date  . Abnormal EKG    HX LEFT BUNDLE BRANCH BLOCK ON 01-11-2004 EKG ON CHART, NORMAL STRESS TEST 2005  . Bladder mass    noted per ct 10/ 2017  . Cholelithiasis    one small gallstone noted per ct 10/ 2017   . CKD (chronic kidney disease), stage III (HCC)    PER PCP NOTE DR HUSAIN (last GFR 52)  --11/06/2016 CKD STAGE 3   . Down syndrome    high function  . Down's syndrome 04/13/2017  . Family history of liver cancer    both of his sister's died from liver cancer  . Foot deformity   . GERD (gastroesophageal reflux disease)   . Gout   . Gouty arthritis    last flare-up  approx. 11/ 2017  . History of Helicobacter pylori infection    followed by dr Michail Sermon [facility])  . HOH (hard of hearing)   . Hyperlipidemia   . Left bundle branch block (LBBB)    per pcp note dr Lysle Rubens LBBB  has been a known before (11-06-2016)  . Lives in independent group home pt lived at home w/ parents until deceased then his 2 sisters were his caregivers until they both decreased -- he now lives at independent group home since 2017--   [name] is name of home (independent w/ guidance, communicates well) and is monitored medically by [employer]  . Memory disorder 04/13/2017  . OA (osteoarthritis)      Objective/Physical Exam General: The patient is alert and oriented x3 in no acute distress.  Dermatology:  Wound #1 noted to the right ankle measuring 2.0 x 2.0 x 0.2 cm (LxWxD).   To the noted ulceration(s), there is no  eschar. There is a moderate amount of slough, fibrin, and necrotic tissue noted. Granulation tissue and wound base is red. There is a minimal amount of serosanguineous drainage noted. There is no exposed bone muscle-tendon ligament or joint. There is no malodor. Periwound integrity is intact. Skin is warm, dry and supple bilateral lower extremities. Nails are tender, long, thickened and dystrophic with subungual debris, consistent with onychomycosis, 1-5 bilateral. No signs of infection noted.  Vascular: Palpable pedal pulses bilaterally. Mild edema noted. Capillary refill within normal limits. Varicosities noted bilateral lower extremities.   Neurological: Epicritic and protective threshold absent bilaterally.   Musculoskeletal Exam: Range of motion within normal limits to all pedal and ankle joints bilateral. Muscle strength 5/5 in all groups bilateral.   Radiographic Exam:  Normal osseous mineralization. Joint spaces preserved. No fracture/dislocation/boney destruction.   Assessment: #1 ulceration to the right ankle secondary to venous insufficiency #2 varicosities bilateral lower extremities #3 Onychodystrophic nails 1-5 bilateral with hyperkeratosis of nails.  #4 Onychomycosis of nail due to dermatophyte bilateral #5 Pain in foot bilateral   Plan of Care:  #1 Patient was evaluated. X-rays reviewed. #2 medically necessary excisional debridement including subcutaneous tissue was performed using a tissue nipper and a chisel blade. Excisional debridement of all the necrotic nonviable tissue down to  healthy bleeding viable tissue was performed with post-debridement measurements same as pre-. #3 the wound was cleansed with normal saline. #4 collagen dressing applied directly to wound followed by dry sterile dressings. #5 Mechanical debridement of nails 1-5 bilaterally performed using a nail nipper. Filed with dremel without incident.  #6 culture taken from ulceration site. #7 recommended  Betadine and large Band-Aid daily. #8 patient is to return to clinic in 3 weeks.   Edrick Kins, DPM [facility]  Dr. Edrick Kins, [place]                                        [place], [place] [address]                Office [phone]  Fax [phone]

## 2017-07-08 LAB — WOUND CULTURE
MICRO NUMBER:: 81117559
SPECIMEN QUALITY:: ADEQUATE

## 2017-07-12 ENCOUNTER — Ambulatory Visit: Payer: Medicare Other | Admitting: Podiatry

## 2017-07-13 DIAGNOSIS — R350 Frequency of micturition: Secondary | ICD-10-CM | POA: Diagnosis not present

## 2017-07-13 DIAGNOSIS — N401 Enlarged prostate with lower urinary tract symptoms: Secondary | ICD-10-CM | POA: Diagnosis not present

## 2017-07-19 ENCOUNTER — Ambulatory Visit (INDEPENDENT_AMBULATORY_CARE_PROVIDER_SITE_OTHER): Payer: Medicare Other | Admitting: Podiatry

## 2017-07-19 ENCOUNTER — Encounter: Payer: Self-pay | Admitting: Podiatry

## 2017-07-19 DIAGNOSIS — B351 Tinea unguium: Secondary | ICD-10-CM | POA: Diagnosis not present

## 2017-07-19 DIAGNOSIS — M79676 Pain in unspecified toe(s): Secondary | ICD-10-CM | POA: Diagnosis not present

## 2017-07-19 NOTE — Progress Notes (Signed)
Patient ID: Sean Murray, male   DOB: 1956/02/22, 61 y.o.   MRN: [id number]    Subjective: This Down syndrome patient presents with caregiver in the treatment room or schedule visit with a history of painful toenails when walking wearing shoes and a history of multiple keratoses and right and left feet Patient's caregivers present treatment  Objective: Patient is pleasant but confused and difficult to understand Patient becomes somewhat combative during visit today DP and PT pulses palpable bilaterally No peripheral edema bilaterally Patient is reactive to direct palpation in the toenails and keratoses No open skin lesions bilaterally Atrophic skin with absent hair growth bilaterally Brown hyperpigmentation skin bilaterally Toenails are elongated, brittle, deformed, discolored 6-10 with reactive tenderness to direct palpation Corn second toes bilaterally Advanced HAV bilaterally Hammertoe 2-5 bilaterally Pes cavus bilaterally  Assessment: Symptomatic onychomycoses 6-10 Corns/calluses bilaterally  Plan: Debridement of toenails 6-10 mechanical and electrical without any bleeding Patient would not tolerate debridement of corns and deferred debridement  Reappoint at three-month intervals for skin a nail debridement

## 2017-07-26 ENCOUNTER — Ambulatory Visit (INDEPENDENT_AMBULATORY_CARE_PROVIDER_SITE_OTHER): Payer: Medicare Other | Admitting: Podiatry

## 2017-07-26 DIAGNOSIS — L97519 Non-pressure chronic ulcer of other part of right foot with unspecified severity: Secondary | ICD-10-CM

## 2017-07-26 DIAGNOSIS — L97312 Non-pressure chronic ulcer of right ankle with fat layer exposed: Secondary | ICD-10-CM | POA: Diagnosis not present

## 2017-07-26 DIAGNOSIS — I83015 Varicose veins of right lower extremity with ulcer other part of foot: Secondary | ICD-10-CM

## 2017-07-26 MED ORDER — GENTAMICIN SULFATE 0.1 % EX CREA: 1.0000 "application " | TOPICAL_CREAM | Freq: Three times a day (TID) | CUTANEOUS | 1 refills | Status: DC

## 2017-07-26 MED ORDER — DOXYCYCLINE HYCLATE 100 MG PO TABS: 100.0000 mg | ORAL_TABLET | Freq: Two times a day (BID) | ORAL | 0 refills | Status: DC

## 2017-07-28 NOTE — Progress Notes (Signed)
Subjective:  Patient presents today for follow up evaluation of an venous ulceration to the right ankle. He denies any complaints from the site. Patient presents today for further treatment and evaluation of the ulceration site.    Past Medical History:  Diagnosis Date  . Abnormal EKG    HX LEFT BUNDLE BRANCH BLOCK ON 01-11-2004 EKG ON CHART, NORMAL STRESS TEST 2005  . Bladder mass    noted per ct 10/ 2017  . Cholelithiasis    one small gallstone noted per ct 10/ 2017   . CKD (chronic kidney disease), stage III (HCC)    PER PCP NOTE DR HUSAIN (last GFR 52)  --11/06/2016 CKD STAGE 3   . Down syndrome    high function  . Down's syndrome 04/13/2017  . Family history of liver cancer    both of his sister's died from liver cancer  . Foot deformity   . GERD (gastroesophageal reflux disease)   . Gout   . Gouty arthritis    last flare-up  approx. 11/ 2017  . History of Helicobacter pylori infection    followed by dr Michail Sermon [facility])  . HOH (hard of hearing)   . Hyperlipidemia   . Left bundle branch block (LBBB)    per pcp note dr Lysle Rubens LBBB  has been a known before (11-06-2016)  . Lives in independent group home pt lived at home w/ parents until deceased then his 2 sisters were his caregivers until they both decreased -- he now lives at independent group home since 2017--   [name] is name of home (independent w/ guidance, communicates well) and is monitored medically by [employer]  . Memory disorder 04/13/2017  . OA (osteoarthritis)      Objective/Physical Exam General: The patient is alert and oriented x3 in no acute distress.  Dermatology:  Wound #1 noted to the right ankle measuring 1.0 x 0.7 x 0.3 cm (LxWxD).   To the noted ulceration(s), there is no eschar. There is a moderate amount of slough, fibrin, and necrotic tissue noted. Granulation tissue and wound base is red. There is a minimal amount of serosanguineous drainage noted. There is no exposed bone  muscle-tendon ligament or joint. There is no malodor. Periwound integrity is intact. Skin is warm, dry and supple bilateral lower extremities. Nails are tender, long, thickened and dystrophic with subungual debris, consistent with onychomycosis, 1-5 bilateral. No signs of infection noted.  Vascular: Palpable pedal pulses bilaterally. Mild edema noted. Capillary refill within normal limits. Varicosities noted bilateral lower extremities.   Neurological: Epicritic and protective threshold absent bilaterally.   Musculoskeletal Exam: Range of motion within normal limits to all pedal and ankle joints bilateral. Muscle strength 5/5 in all groups bilateral.     Assessment: #1 ulceration to the right ankle secondary to venous insufficiency #2 varicosities bilateral lower extremities   Plan of Care:  #1 Patient was evaluated.  #2 medically necessary excisional debridement including muscle and deep fascial tissue was performed using a tissue nipper and a chisel blade. Excisional debridement of all the necrotic nonviable tissue down to healthy bleeding viable tissue was performed with post-debridement measurements same as pre-. #3 The wound was cleansed and dry sterile dressing applied. #4 prescription for gentamicin cream given to patient. #5 prescription for doxycycline # 20 given to patient. #6 return to clinic in 2 weeks.   Edrick Kins, DPM [facility]  Dr. Edrick Kins, DPM    [place]  [place], [place] [address]                Office [phone]  Fax [phone]

## 2017-08-09 ENCOUNTER — Ambulatory Visit (INDEPENDENT_AMBULATORY_CARE_PROVIDER_SITE_OTHER): Payer: Medicare Other | Admitting: Podiatry

## 2017-08-09 DIAGNOSIS — E46 Unspecified protein-calorie malnutrition: Secondary | ICD-10-CM | POA: Diagnosis not present

## 2017-08-09 DIAGNOSIS — I8310 Varicose veins of unspecified lower extremity with inflammation: Secondary | ICD-10-CM | POA: Diagnosis not present

## 2017-08-09 DIAGNOSIS — L97312 Non-pressure chronic ulcer of right ankle with fat layer exposed: Secondary | ICD-10-CM

## 2017-08-09 DIAGNOSIS — F0390 Unspecified dementia without behavioral disturbance: Secondary | ICD-10-CM | POA: Diagnosis not present

## 2017-08-09 DIAGNOSIS — I872 Venous insufficiency (chronic) (peripheral): Secondary | ICD-10-CM | POA: Diagnosis not present

## 2017-08-09 DIAGNOSIS — Q909 Down syndrome, unspecified: Secondary | ICD-10-CM | POA: Diagnosis not present

## 2017-08-09 DIAGNOSIS — R32 Unspecified urinary incontinence: Secondary | ICD-10-CM | POA: Diagnosis not present

## 2017-08-09 DIAGNOSIS — S81801A Unspecified open wound, right lower leg, initial encounter: Secondary | ICD-10-CM | POA: Diagnosis not present

## 2017-08-09 DIAGNOSIS — I83015 Varicose veins of right lower extremity with ulcer other part of foot: Secondary | ICD-10-CM

## 2017-08-09 DIAGNOSIS — Z1389 Encounter for screening for other disorder: Secondary | ICD-10-CM | POA: Diagnosis not present

## 2017-08-09 DIAGNOSIS — N183 Chronic kidney disease, stage 3 (moderate): Secondary | ICD-10-CM | POA: Diagnosis not present

## 2017-08-09 DIAGNOSIS — L97519 Non-pressure chronic ulcer of other part of right foot with unspecified severity: Secondary | ICD-10-CM

## 2017-08-09 DIAGNOSIS — E7800 Pure hypercholesterolemia, unspecified: Secondary | ICD-10-CM | POA: Diagnosis not present

## 2017-08-09 DIAGNOSIS — M109 Gout, unspecified: Secondary | ICD-10-CM | POA: Diagnosis not present

## 2017-08-09 DIAGNOSIS — Z23 Encounter for immunization: Secondary | ICD-10-CM | POA: Diagnosis not present

## 2017-08-11 NOTE — Progress Notes (Signed)
Subjective:  Patient presents today for follow up evaluation of a venous ulceration to the right ankle. He states the area is improving. He has no new complaints at this time. Patient presents today for further treatment and evaluation.   Past Medical History:  Diagnosis Date  . Abnormal EKG    HX LEFT BUNDLE BRANCH BLOCK ON 01-11-2004 EKG ON CHART, NORMAL STRESS TEST 2005  . Bladder mass    noted per ct 10/ 2017  . Cholelithiasis    one small gallstone noted per ct 10/ 2017   . CKD (chronic kidney disease), stage III (HCC)    PER PCP NOTE DR HUSAIN (last GFR 52)  --11/06/2016 CKD STAGE 3   . Down syndrome    high function  . Down's syndrome 04/13/2017  . Family history of liver cancer    both of his sister's died from liver cancer  . Foot deformity   . GERD (gastroesophageal reflux disease)   . Gout   . Gouty arthritis    last flare-up  approx. 11/ 2017  . History of Helicobacter pylori infection    followed by dr Michail Sermon [facility])  . HOH (hard of hearing)   . Hyperlipidemia   . Left bundle branch block (LBBB)    per pcp note dr Lysle Rubens LBBB  has been a known before (11-06-2016)  . Lives in independent group home pt lived at home w/ parents until deceased then his 2 sisters were his caregivers until they both decreased -- he now lives at independent group home since 2017--   [name] is name of home (independent w/ guidance, communicates well) and is monitored medically by [employer]  . Memory disorder 04/13/2017  . OA (osteoarthritis)      Objective/Physical Exam General: The patient is alert and oriented x3 in no acute distress.  Dermatology:  Wound #1 noted to the right ankle measuring 0.5 x 0.6 x 0.1 cm (LxWxD).   To the noted ulceration(s), there is no eschar. There is a moderate amount of slough, fibrin, and necrotic tissue noted. Granulation tissue and wound base is red. There is a minimal amount of serosanguineous drainage noted. There is no exposed bone  muscle-tendon ligament or joint. There is no malodor. Periwound integrity is intact. Skin is warm, dry and supple bilateral lower extremities. Nails are tender, long, thickened and dystrophic with subungual debris, consistent with onychomycosis, 1-5 bilateral. No signs of infection noted.  Vascular: Palpable pedal pulses bilaterally. Mild edema noted. Capillary refill within normal limits. Varicosities noted bilateral lower extremities.   Neurological: Epicritic and protective threshold absent bilaterally.   Musculoskeletal Exam: Range of motion within normal limits to all pedal and ankle joints bilateral. Muscle strength 5/5 in all groups bilateral.     Assessment: #1 ulceration to the right ankle secondary to venous insufficiency-improving #2 varicosities bilateral lower extremities   Plan of Care:  #1 Patient was evaluated.  #2 medically necessary excisional debridement including subcutaneous tissue was performed using a tissue nipper and a chisel blade. Excisional debridement of all the necrotic nonviable tissue down to healthy bleeding viable tissue was performed with post-debridement measurements same as pre-. #3 the wound was cleansed and dry sterile dressing applied. #4 Continue using gentamicin cream daily with a Band-Aid. #5 Finish Doxycycline as directed.  #6 Return to clinic in 3 weeks.    Edrick Kins, DPM [facility]  Dr. Edrick Kins, DPM    [place]  [place], [place] [address]                Office [phone]  Fax [phone]

## 2017-08-30 ENCOUNTER — Ambulatory Visit (INDEPENDENT_AMBULATORY_CARE_PROVIDER_SITE_OTHER): Payer: Medicare Other | Admitting: Podiatry

## 2017-08-30 ENCOUNTER — Encounter: Payer: Self-pay | Admitting: Podiatry

## 2017-08-30 DIAGNOSIS — L97519 Non-pressure chronic ulcer of other part of right foot with unspecified severity: Secondary | ICD-10-CM

## 2017-08-30 DIAGNOSIS — L97312 Non-pressure chronic ulcer of right ankle with fat layer exposed: Secondary | ICD-10-CM | POA: Diagnosis not present

## 2017-08-30 DIAGNOSIS — I83015 Varicose veins of right lower extremity with ulcer other part of foot: Secondary | ICD-10-CM

## 2017-09-01 NOTE — Progress Notes (Signed)
Subjective:  Patient presents today for follow up evaluation of a venous ulceration to the right ankle.  He states he is doing well overall.  He has been applying gentamicin cream as directed and has finished the course of doxycycline.  He has no new complaints at this time. Patient presents today for further treatment and evaluation.   Past Medical History:  Diagnosis Date  . Abnormal EKG    HX LEFT BUNDLE BRANCH BLOCK ON 01-11-2004 EKG ON CHART, NORMAL STRESS TEST 2005  . Bladder mass    noted per ct 10/ 2017  . Cholelithiasis    one small gallstone noted per ct 10/ 2017   . CKD (chronic kidney disease), stage III (HCC)    PER PCP NOTE DR HUSAIN (last GFR 52)  --11/06/2016 CKD STAGE 3   . Down syndrome    high function  . Down's syndrome 04/13/2017  . Family history of liver cancer    both of his sister's died from liver cancer  . Foot deformity   . GERD (gastroesophageal reflux disease)   . Gout   . Gouty arthritis    last flare-up  approx. 11/ 2017  . History of Helicobacter pylori infection    followed by dr Michail Sermon [facility])  . HOH (hard of hearing)   . Hyperlipidemia   . Left bundle branch block (LBBB)    per pcp note dr Lysle Rubens LBBB  has been a known before (11-06-2016)  . Lives in independent group home pt lived at home w/ parents until deceased then his 2 sisters were his caregivers until they both decreased -- he now lives at independent group home since 2017--   [name] is name of home (independent w/ guidance, communicates well) and is monitored medically by [employer]  . Memory disorder 04/13/2017  . OA (osteoarthritis)      Objective/Physical Exam General: The patient is alert and oriented x3 in no acute distress.  Dermatology:  Wound #1 noted to the right ankle measuring 0.8 x 0.6 x 0.2 cm (LxWxD).   To the noted ulceration(s), there is no eschar. There is a moderate amount of slough, fibrin, and necrotic tissue noted. Granulation tissue and wound  base is red. There is a minimal amount of serosanguineous drainage noted. There is no exposed bone muscle-tendon ligament or joint. There is no malodor. Periwound integrity is intact. Skin is warm, dry and supple bilateral lower extremities. Nails are tender, long, thickened and dystrophic with subungual debris, consistent with onychomycosis, 1-5 bilateral. No signs of infection noted.  Vascular: Palpable pedal pulses bilaterally. Mild edema noted. Capillary refill within normal limits. Varicosities noted bilateral lower extremities.   Neurological: Epicritic and protective threshold absent bilaterally.   Musculoskeletal Exam: Range of motion within normal limits to all pedal and ankle joints bilateral. Muscle strength 5/5 in all groups bilateral.     Assessment: #1 ulceration to the right ankle secondary to venous insufficiency-improving #2 varicosities bilateral lower extremities   Plan of Care:  #1 Patient was evaluated.  #2 medically necessary excisional debridement including subcutaneous tissue was performed using a tissue nipper and a chisel blade. Excisional debridement of all the necrotic nonviable tissue down to healthy bleeding viable tissue was performed with post-debridement measurements same as pre-. #3 the wound was cleansed and dry sterile dressing applied. #4 Continue using gentamicin cream daily with a Band-Aid. #5  Patient finished oral antibiotic, doxycycline, last visit. #6 Return to clinic in 4 weeks.    Dorathy Daft.  Amalia Hailey, DPM [facility]  Dr. Edrick Kins, [place]                                        [place], [place] [address]                Office [phone]  Fax [phone]

## 2017-09-22 ENCOUNTER — Ambulatory Visit (INDEPENDENT_AMBULATORY_CARE_PROVIDER_SITE_OTHER): Payer: Medicare Other | Admitting: Podiatry

## 2017-09-22 ENCOUNTER — Encounter: Payer: Self-pay | Admitting: Podiatry

## 2017-09-22 DIAGNOSIS — I83015 Varicose veins of right lower extremity with ulcer other part of foot: Secondary | ICD-10-CM | POA: Diagnosis not present

## 2017-09-22 DIAGNOSIS — L97312 Non-pressure chronic ulcer of right ankle with fat layer exposed: Secondary | ICD-10-CM

## 2017-09-22 DIAGNOSIS — L97519 Non-pressure chronic ulcer of other part of right foot with unspecified severity: Secondary | ICD-10-CM | POA: Diagnosis not present

## 2017-09-22 MED ORDER — GENTAMICIN SULFATE 0.1 % EX CREA: 1.0000 "application " | TOPICAL_CREAM | Freq: Three times a day (TID) | CUTANEOUS | 1 refills | Status: AC

## 2017-09-29 NOTE — Progress Notes (Signed)
Subjective:  Patient presents today for follow up evaluation of a venous ulceration to the right ankle.  He states he is doing well overall and reports the wound looks better.  He has been applying gentamicin cream as directed.  He has no new complaints at this time. Patient presents today for further treatment and evaluation.   Past Medical History:  Diagnosis Date  . Abnormal EKG    HX LEFT BUNDLE BRANCH BLOCK ON 01-11-2004 EKG ON CHART, NORMAL STRESS TEST 2005  . Bladder mass    noted per ct 10/ 2017  . Cholelithiasis    one small gallstone noted per ct 10/ 2017   . CKD (chronic kidney disease), stage III (HCC)    PER PCP NOTE DR HUSAIN (last GFR 52)  --11/06/2016 CKD STAGE 3   . Down syndrome    high function  . Down's syndrome 04/13/2017  . Family history of liver cancer    both of his sister's died from liver cancer  . Foot deformity   . GERD (gastroesophageal reflux disease)   . Gout   . Gouty arthritis    last flare-up  approx. 11/ 2017  . History of Helicobacter pylori infection    followed by dr Michail Sermon [facility])  . HOH (hard of hearing)   . Hyperlipidemia   . Left bundle branch block (LBBB)    per pcp note dr Lysle Rubens LBBB  has been a known before (11-06-2016)  . Lives in independent group home pt lived at home w/ parents until deceased then his 2 sisters were his caregivers until they both decreased -- he now lives at independent group home since 2017--   [name] is name of home (independent w/ guidance, communicates well) and is monitored medically by [employer]  . Memory disorder 04/13/2017  . OA (osteoarthritis)      Objective/Physical Exam General: The patient is alert and oriented x3 in no acute distress.  Dermatology:  Wound #1 noted to the right ankle measuring 0.1 x 0.1 x 0.1 cm (LxWxD).   To the noted ulceration(s), there is no eschar. There is a moderate amount of slough, fibrin, and necrotic tissue noted. Granulation tissue and wound base is  red. There is a minimal amount of serosanguineous drainage noted. There is no exposed bone muscle-tendon ligament or joint. There is no malodor. Periwound integrity is intact. Skin is warm, dry and supple bilateral lower extremities.  Vascular: Palpable pedal pulses bilaterally. Mild edema noted. Capillary refill within normal limits. Varicosities noted bilateral lower extremities.   Neurological: Epicritic and protective threshold absent bilaterally.   Musculoskeletal Exam: Range of motion within normal limits to all pedal and ankle joints bilateral. Muscle strength 5/5 in all groups bilateral.     Assessment: #1 ulceration to the right ankle secondary to venous insufficiency-improving #2 varicosities bilateral lower extremities   Plan of Care:  #1 Patient was evaluated.  #2  Lightly debrided with tissue nipper.  Antibiotic ointment and Band-Aid applied. #3 continue using gentamicin cream daily with a Band-Aid until completely healed. #4 continue wearing DM shoes. #5 return to clinic as needed.    Edrick Kins, DPM [facility]  Dr. Edrick Kins, DPM    [address],  [place] [address]                Office [phone]  Fax [phone]

## 2017-10-08 ENCOUNTER — Ambulatory Visit (INDEPENDENT_AMBULATORY_CARE_PROVIDER_SITE_OTHER): Payer: Medicare Other | Admitting: Podiatry

## 2017-10-08 ENCOUNTER — Encounter: Payer: Self-pay | Admitting: Podiatry

## 2017-10-08 DIAGNOSIS — L97501 Non-pressure chronic ulcer of other part of unspecified foot limited to breakdown of skin: Secondary | ICD-10-CM | POA: Diagnosis not present

## 2017-10-08 DIAGNOSIS — M79676 Pain in unspecified toe(s): Secondary | ICD-10-CM | POA: Diagnosis not present

## 2017-10-08 DIAGNOSIS — L97312 Non-pressure chronic ulcer of right ankle with fat layer exposed: Secondary | ICD-10-CM | POA: Diagnosis not present

## 2017-10-08 DIAGNOSIS — B351 Tinea unguium: Secondary | ICD-10-CM

## 2017-10-09 NOTE — Progress Notes (Signed)
This patient resents to the office for examination of his a chronic ulcer right ankle  as well as preventative foot care services for his nails .He also relates having a painful callus on the bottom of both feet  . He presents to the office with his guardian at the group home.  The guardian says that he has minimal drainage coming from the right ankle ulcer.  She says she continues to apply gentamicin and a bandage to the site.  She also says his nails have grown thick and long .  she states that he says the nails are painful in his shoes.  He presents the office today for preventative foot care services.    General Appearance  Alert, conversant and in no acute stress.  Vascular  Dorsalis pedis and posterior pulses are palpable  bilaterally.  Capillary return is within normal limits  bilaterally. Temperature is within normal limits  Bilaterally. Venous stasis present medial aspect right ankle.  Neurologic  Senn-Weinstein monofilament wire test absent.  bilaterally. Muscle power within normal limits bilaterally.  Nails Thick disfigured discolored nails with subungual debris bilaterally from hallux to fifth toes bilaterally. No evidence of bacterial infection or drainage bilaterally.  Orthopedic  No limitations of motion of motion feet bilaterally.  No crepitus or effusions noted.  No bony pathology or digital deformities noted.  Skin  normotropic skin with no porokeratosis noted bilaterally.  No signs of infections or ulcers noted.  Pinhole on the right ankle medial malleolus has closed.  Porokeratosis sub 4th left and sub 5th right foot.   Onychomycosis  B/L  S/P ulcer right foot.  Ulcer fifth toe right foot.  ROV.  Debridement of onychomycotic nails 10.  During treatment of the nail. It was noted that he had a skin ulcer on the medial aspect of the fifth digit right foot.  Neosporin dry sterile dressing was applied.  Patient told to soak his right foot and  bandage his right toe  with gentamicin  and a Band-Aid. RTC 3 months. for preventative foot care services. . Debridement of porokeratosis  B/L.   Gardiner Barefoot DPM

## 2017-10-14 ENCOUNTER — Ambulatory Visit (INDEPENDENT_AMBULATORY_CARE_PROVIDER_SITE_OTHER): Payer: Medicare Other | Admitting: Adult Health

## 2017-10-14 ENCOUNTER — Encounter: Payer: Self-pay | Admitting: Adult Health

## 2017-10-14 VITALS — BP 99/66 | HR 72 | Ht 63.0 in | Wt 136.4 lb

## 2017-10-14 DIAGNOSIS — Q909 Down syndrome, unspecified: Secondary | ICD-10-CM

## 2017-10-14 DIAGNOSIS — R413 Other amnesia: Secondary | ICD-10-CM | POA: Diagnosis not present

## 2017-10-14 NOTE — Progress Notes (Signed)
PATIENT: Sean Murray DOB: 08/31/1956  REASON FOR VISIT: follow up HISTORY FROM: patient  HISTORY OF PRESENT ILLNESS: Today 10/14/17 Sean Murray is a 62 year old male with a history of memory disturbance and Down syndrome.  He returns today for follow-up.  He is here today with his caregiver.  She reports that she has noticed more trouble with him completing tasks such as fastening a seatbelt.  He is able to complete some ADLs independently but does require supervision most of the time.  Caregiver reports that he does have a good appetite.  In regards to his sleep he has good nights and bad nights.  She states that the episodes of wandering off has decreased.  He is currently on Aricept 10 mg at bedtime.  She reports occasionally he will become agitated if he does not want to do something.  He returns today for an evaluation.  HISTORY Sean Murray is a 62 year old right-handed white male with a history of Down syndrome. The patient currently is living at an adult home situation. He has been there about one year, he comes in with a caretaker who has known him for about one year. The patient has had a decline in his ability to function independently. He does require assistance with bathing, he can dress himself. Over the last 2 months, he has begun to wander, he has had an event 3 weeks ago where he could not find the bathroom and urinated in an office. He has had difficulty understanding how to open a car door which she used to know how to do. The patient is able to feed himself. He may get up in the middle of the night and wander at times. The patient is eating relatively well, but he has lost some weight since being at the group home. The patient may eat a meal, and instead of taking his plate into the kitchen he will take it back to his bedroom. The patient does have some foot pain, he does not have any significant gait instability but he has fallen on one occasion. He is sent to this office for further  evaluation. He has had a recent CT scan of the brain.   REVIEW OF SYSTEMS: Out of a complete 14 system review of symptoms, the patient complains only of the following symptoms, and all other reviewed systems are negative.  Hearing loss, choking, agitation, confusion, memory loss  ALLERGIES: Allergies  Allergen Reactions  . Amoxicillin Rash    Unsure wasn't listed on [facility] 01/29/17.  Severe rash  . Nsaids Other (See Comments)    Avoid NSAIDS--  Strong family hx liver cancer    HOME MEDICATIONS: Outpatient Medications Prior to Visit  Medication Sig Dispense Refill  . acetaminophen (TYLENOL) 500 MG tablet Take 500-1,000 mg by mouth every 6 (six) hours as needed for moderate pain.     Marland Kitchen allopurinol (ZYLOPRIM) 300 MG tablet Take 300 mg by mouth every morning.    . colchicine 0.6 MG tablet Take 0.6 mg by mouth 2 (two) times daily as needed (gout flare). For up to 7 days    . donepezil (ARICEPT) 5 MG tablet Take 1 tablet (5 mg total) by mouth at bedtime. 30 tablet 1  . ENSURE PLUS (ENSURE PLUS) LIQD Take 237 mLs by mouth 2 (two) times daily between meals.    Marland Kitchen gentamicin cream (GARAMYCIN) 0.1 % Apply 1 application topically 3 (three) times daily. 30 g 1  . Lutein 20 MG TABS Take 1 tablet  by mouth 2 (two) times daily.     . mirabegron ER (MYRBETRIQ) 25 MG TB24 tablet Take 25 mg by mouth daily.    . Multiple Vitamins-Minerals (MULTIVITAMIN WITH MINERALS) tablet Take 1 tablet by mouth every morning.     . ranitidine (ZANTAC) 300 MG capsule Take 300 mg by mouth at bedtime.     . simvastatin (ZOCOR) 20 MG tablet Take 20 mg by mouth every evening.     . tamsulosin (FLOMAX) 0.4 MG CAPS capsule Take 0.4 mg by mouth at bedtime.    . triamcinolone cream (KENALOG) 0.1 % Apply 1 application topically 2 (two) times daily as needed (rash).     Marland Kitchen doxycycline (VIBRA-TABS) 100 MG tablet Take 1 tablet (100 mg total) by mouth 2 (two) times daily. (Patient not taking: Reported on 10/14/2017) 20 tablet 0   No  facility-administered medications prior to visit.     PAST MEDICAL HISTORY: Past Medical History:  Diagnosis Date  . Abnormal EKG    HX LEFT BUNDLE BRANCH BLOCK ON 01-11-2004 EKG ON CHART, NORMAL STRESS TEST 2005  . Bladder mass    noted per ct 10/ 2017  . Cholelithiasis    one small gallstone noted per ct 10/ 2017   . CKD (chronic kidney disease), stage III (HCC)    PER PCP NOTE DR HUSAIN (last GFR 52)  --11/06/2016 CKD STAGE 3   . Down syndrome    high function  . Down's syndrome 04/13/2017  . Family history of liver cancer    both of his sister's died from liver cancer  . Foot deformity   . GERD (gastroesophageal reflux disease)   . Gout   . Gouty arthritis    last flare-up  approx. 11/ 2017  . History of Helicobacter pylori infection    followed by dr Michail Sermon [facility])  . HOH (hard of hearing)   . Hyperlipidemia   . Left bundle branch block (LBBB)    per pcp note dr Lysle Rubens LBBB  has been a known before (11-06-2016)  . Lives in independent group home pt lived at home w/ parents until deceased then his 2 sisters were his caregivers until they both decreased -- he now lives at independent group home since 2017--   [name] is name of home (independent w/ guidance, communicates well) and is monitored medically by [employer]  . Memory disorder 04/13/2017  . OA (osteoarthritis)     PAST SURGICAL HISTORY: Past Surgical History:  Procedure Laterality Date  . COLONOSCOPY  03/13/2010  . CYSTOSCOPY W/ RETROGRADES Bilateral 11/09/2016   Procedure: CYSTOSCOPY WITH RETROGRADE PYELOGRAM;  Surgeon: Cleon Gustin, MD;  Location: [facility];  Service: Urology;  Laterality: Bilateral;  . CYSTOSCOPY WITH BIOPSY N/A 11/09/2016   Procedure: CYSTOSCOPY WITH  BLADDER BIOPSY, FULGERATION;  Surgeon: Cleon Gustin, MD;  Location: [facility];  Service: Urology;  Laterality: N/A;  . ESOPHAGOGASTRODUODENOSCOPY (EGD) WITH PROPOFOL N/A 12/23/2016    Procedure: ESOPHAGOGASTRODUODENOSCOPY (EGD) WITH PROPOFOL;  Surgeon: Wilford Corner, MD;  Location: [facility];  Service: Endoscopy;  Laterality: N/A;    FAMILY HISTORY: No family history on file.  SOCIAL HISTORY: Social History   Socioeconomic History  . Marital status: Single    Spouse name: Not on file  . Number of children: Not on file  . Years of education: Not on file  . Highest education level: Not on file  Social Needs  . Financial resource strain: Not on file  . Food  insecurity - worry: Not on file  . Food insecurity - inability: Not on file  . Transportation needs - medical: Not on file  . Transportation needs - non-medical: Not on file  Occupational History  . Not on file  Tobacco Use  . Smoking status: Never Smoker  . Smokeless tobacco: Never Used  Substance and Sexual Activity  . Alcohol use: No  . Drug use: No  . Sexual activity: Not on file  Other Topics Concern  . Not on file  Social History Narrative   Lives at [facility]   Caffeine use: none      PHYSICAL EXAM  Vitals:   10/14/17 0907  Height: 5\' 3"  (1.6 m)   Body mass index is 23.03 kg/m.     Generalized: Well developed, in no acute distress   Neurological examination  Mentation: Alert.  Speech is garbled.  Follows commands intermittently. Cranial nerve II-XII: Pupils were equal round reactive to light. Extraocular movements were full, visual field were full on confrontational test. Facial sensation and strength were normal Motor: The motor testing reveals 5 over 5 strength of all 4 extremities. Good symmetric motor tone is noted throughout.  Sensory: Sensory testing is intact to soft touch on all 4 extremities. No evidence of extinction is noted.  Coordination: Cerebellar testing reveals good finger-nose-finger and heel-to-shin bilaterally.  Gait and station: Gait is normal. Tandem gait is normal. Romberg is negative. No drift is seen.  Reflexes:  Deep tendon reflexes are symmetric and normal bilaterally.   DIAGNOSTIC DATA (LABS, IMAGING, TESTING) - I reviewed patient records, labs, notes, testing and imaging myself where available.  Lab Results  Component Value Date   WBC 15.2 (H) 01/29/2017   HGB 14.0 01/29/2017   HCT 41.5 01/29/2017   MCV 102.2 (H) 01/29/2017   PLT 181 01/29/2017      Component Value Date/Time   NA 137 01/29/2017 0959   K 4.6 01/29/2017 0959   CL 103 01/29/2017 0959   CO2 25 01/29/2017 0959   GLUCOSE 129 (H) 01/29/2017 0959   BUN 24 (H) 01/29/2017 0959   CREATININE 1.54 (H) 01/29/2017 0959   CALCIUM 9.1 01/29/2017 0959   PROT 6.5 07/07/2016 2304   ALBUMIN 3.4 (L) 07/07/2016 2304   AST 22 07/07/2016 2304   ALT 14 (L) 07/07/2016 2304   ALKPHOS 73 07/07/2016 2304   BILITOT 0.9 07/07/2016 2304   GFRNONAA 47 (L) 01/29/2017 0959   GFRAA 54 (L) 01/29/2017 0959    Lab Results  Component Value Date   VITAMINB12 379 04/13/2017   Lab Results  Component Value Date   TSH 5.580 (H) 04/13/2017      ASSESSMENT AND PLAN 62 y.o. year old male  has a past medical history of Abnormal EKG, Bladder mass, Cholelithiasis, CKD (chronic kidney disease), stage III ([place]), Down syndrome, Down's syndrome (04/13/2017), Family history of liver cancer, Foot deformity, GERD (gastroesophageal reflux disease), Gout, Gouty arthritis, History of Helicobacter pylori infection, HOH (hard of hearing), Hyperlipidemia, Left bundle branch block (LBBB), Lives in independent group home (pt lived at home w/ parents until deceased then his 2 sisters were his caregivers until they both decreased -- he now lives at independent group home since 2017--), Memory disorder (04/13/2017), and OA (osteoarthritis). here with:  1.  Memory disturbance 2.  Down syndrome  We were unable to test the memory test as the patient is unable to do this.  He will continue on Aricept 10 mg at  bedtime.  Advised that they should monitor his weight as Aricept can  cause weight loss.  If his symptoms worsen or he develops new symptoms he should let us know.  He will follow-up in 6 months or sooner if needed.    Ward Givens, MSN, NP-C 10/14/2017, 9:34 AM [facility] [address] [place], [place] [address] [phone]

## 2017-10-14 NOTE — Patient Instructions (Signed)
Your Plan:  Continue Aricept Monitor weight If your symptoms worsen or you develop new symptoms please let us know.   Thank you for coming to see [place] at [facility]. I hope we have been able to provide you high quality care today.  You may receive a patient satisfaction survey over the next few weeks. We would appreciate your feedback and comments so that we may continue to improve ourselves and the health of our patients.

## 2017-10-14 NOTE — Progress Notes (Signed)
I have read the note, and I agree with the clinical assessment and plan.  Sean Murray   

## 2017-10-18 ENCOUNTER — Ambulatory Visit: Payer: Medicare Other | Admitting: Podiatry

## 2017-10-19 DIAGNOSIS — H9190 Unspecified hearing loss, unspecified ear: Secondary | ICD-10-CM | POA: Diagnosis not present

## 2017-10-19 DIAGNOSIS — Z1389 Encounter for screening for other disorder: Secondary | ICD-10-CM | POA: Diagnosis not present

## 2017-10-19 DIAGNOSIS — M109 Gout, unspecified: Secondary | ICD-10-CM | POA: Diagnosis not present

## 2017-10-19 DIAGNOSIS — M21969 Unspecified acquired deformity of unspecified lower leg: Secondary | ICD-10-CM | POA: Diagnosis not present

## 2017-10-19 DIAGNOSIS — M1990 Unspecified osteoarthritis, unspecified site: Secondary | ICD-10-CM | POA: Diagnosis not present

## 2017-10-19 DIAGNOSIS — I872 Venous insufficiency (chronic) (peripheral): Secondary | ICD-10-CM | POA: Diagnosis not present

## 2017-10-19 DIAGNOSIS — F0390 Unspecified dementia without behavioral disturbance: Secondary | ICD-10-CM | POA: Diagnosis not present

## 2017-10-19 DIAGNOSIS — Q909 Down syndrome, unspecified: Secondary | ICD-10-CM | POA: Diagnosis not present

## 2017-10-19 DIAGNOSIS — N183 Chronic kidney disease, stage 3 (moderate): Secondary | ICD-10-CM | POA: Diagnosis not present

## 2017-10-19 DIAGNOSIS — E7800 Pure hypercholesterolemia, unspecified: Secondary | ICD-10-CM | POA: Diagnosis not present

## 2017-10-19 DIAGNOSIS — Z0000 Encounter for general adult medical examination without abnormal findings: Secondary | ICD-10-CM | POA: Diagnosis not present

## 2017-10-27 DIAGNOSIS — H9190 Unspecified hearing loss, unspecified ear: Secondary | ICD-10-CM | POA: Diagnosis not present

## 2017-11-22 DIAGNOSIS — K529 Noninfective gastroenteritis and colitis, unspecified: Secondary | ICD-10-CM | POA: Diagnosis not present

## 2017-12-07 ENCOUNTER — Other Ambulatory Visit: Payer: Self-pay | Admitting: Internal Medicine

## 2017-12-07 ENCOUNTER — Ambulatory Visit
Admission: RE | Admit: 2017-12-07 | Discharge: 2017-12-07 | Disposition: A | Discharge status: Home / Self Care | Payer: Medicare Other | Source: Ambulatory Visit | Attending: Internal Medicine | Admitting: Internal Medicine

## 2017-12-07 DIAGNOSIS — M25461 Effusion, right knee: Secondary | ICD-10-CM | POA: Diagnosis not present

## 2017-12-07 DIAGNOSIS — M25561 Pain in right knee: Secondary | ICD-10-CM

## 2017-12-20 DIAGNOSIS — M25562 Pain in left knee: Secondary | ICD-10-CM | POA: Diagnosis not present

## 2017-12-20 DIAGNOSIS — S8002XA Contusion of left knee, initial encounter: Secondary | ICD-10-CM | POA: Diagnosis not present

## 2017-12-20 DIAGNOSIS — M25561 Pain in right knee: Secondary | ICD-10-CM | POA: Diagnosis not present

## 2017-12-20 DIAGNOSIS — M1711 Unilateral primary osteoarthritis, right knee: Secondary | ICD-10-CM | POA: Diagnosis not present

## 2017-12-20 DIAGNOSIS — M1712 Unilateral primary osteoarthritis, left knee: Secondary | ICD-10-CM | POA: Diagnosis not present

## 2018-01-11 ENCOUNTER — Ambulatory Visit (INDEPENDENT_AMBULATORY_CARE_PROVIDER_SITE_OTHER): Payer: Medicare Other | Admitting: Sports Medicine

## 2018-01-11 ENCOUNTER — Encounter: Payer: Self-pay | Admitting: Podiatry

## 2018-01-11 DIAGNOSIS — B351 Tinea unguium: Secondary | ICD-10-CM | POA: Diagnosis not present

## 2018-01-11 DIAGNOSIS — M79676 Pain in unspecified toe(s): Secondary | ICD-10-CM | POA: Diagnosis not present

## 2018-01-11 DIAGNOSIS — S90414A Abrasion, right lesser toe(s), initial encounter: Secondary | ICD-10-CM

## 2018-01-11 NOTE — Progress Notes (Signed)
Subjective: Sean Murray is a 62 y.o. male patient seen today in office with complaint of mildly painful thickened and elongated toenails; unable to trim. Patient is assisted by facility caregiver who reports that he has no complaints at that they have been putting antibiotic cream at right 5th toe with some improvement. No other pedal complaints at this time.   Patient Active Problem List   Diagnosis Date Noted  . Down's syndrome 04/13/2017  . Memory disorder 04/13/2017  . HOH (hard of hearing) 04/13/2017  . Generalized abdominal pain 12/23/2016  . Abnormal findings-gastrointestinal tract 12/23/2016    Current Outpatient Medications on File Prior to Visit  Medication Sig Dispense Refill  . acetaminophen (TYLENOL) 500 MG tablet Take 500-1,000 mg by mouth every 6 (six) hours as needed for moderate pain.     Marland Kitchen allopurinol (ZYLOPRIM) 300 MG tablet Take 300 mg by mouth every morning.    . colchicine 0.6 MG tablet Take 0.6 mg by mouth 2 (two) times daily as needed (gout flare). For up to 7 days    . donepezil (ARICEPT) 5 MG tablet Take 1 tablet (5 mg total) by mouth at bedtime. 30 tablet 1  . ENSURE PLUS (ENSURE PLUS) LIQD Take 237 mLs by mouth 2 (two) times daily between meals.    Marland Kitchen gentamicin cream (GARAMYCIN) 0.1 % Apply 1 application topically 3 (three) times daily. 30 g 1  . Lutein 20 MG TABS Take 1 tablet by mouth 2 (two) times daily.     . mirabegron ER (MYRBETRIQ) 25 MG TB24 tablet Take 25 mg by mouth daily.    . Multiple Vitamins-Minerals (MULTIVITAMIN WITH MINERALS) tablet Take 1 tablet by mouth every morning.     . ranitidine (ZANTAC) 300 MG capsule Take 300 mg by mouth at bedtime.     . simvastatin (ZOCOR) 20 MG tablet Take 20 mg by mouth every evening.     . tamsulosin (FLOMAX) 0.4 MG CAPS capsule Take 0.4 mg by mouth at bedtime.    . triamcinolone cream (KENALOG) 0.1 % Apply 1 application topically 2 (two) times daily as needed (rash).      No current facility-administered  medications on file prior to visit.     Allergies  Allergen Reactions  . Amoxicillin Rash    Unsure wasn't listed on [facility] 01/29/17.  Severe rash  . Nsaids Other (See Comments)    Avoid NSAIDS--  Strong family hx liver cancer    Objective: Physical Exam  General: Well developed, nourished, no acute distress, awake, alert and oriented x 2, Down's syndrome   Vascular: Dorsalis pedis artery 2/4 bilateral, Posterior tibial artery 1/4 bilateral, skin temperature warm to warm proximal to distal bilateral lower extremities, no varicosities, pedal hair present bilateral.  Neurological: Gross sensation present via light touch bilateral.   Dermatological: Skin is warm, dry, and supple bilateral, Nails 1-10 are tender, long, thick, and discolored with mild subungal debris, no webspace macerations present bilateral, no open lesions present bilateral, Minimal callus sub met 4 bilateral. Pinpoint abrasion of skin at medial right 5th toe. No signs of infection bilateral.  Musculoskeletal: Asymptomatic bunion and hammertoe boney deformities noted bilateral. Muscular strength within normal limits without painon range of motion. No pain with calf compression bilateral.  Assessment and Plan:  Problem List Items Addressed This Visit    None    Visit Diagnoses    Pain due to onychomycosis of toenail    -  Primary   Abrasion of lesser toe of right  foot, initial encounter         -Examined patient.  -Discussed treatment options for painful mycotic nails and abrasion of right 5th toe. -Mechanically debrided and reduced mycotic nails with sterile nail nipper and dremel nail file without incident. -Callus very minimal no debridement necessary at today's visit  -Recommend neosporin and band-aid to right 5th toe until healed  -Patient to return in 3 months for follow up evaluation or sooner if symptoms worsen.  Landis Martins, DPM

## 2018-01-16 ENCOUNTER — Other Ambulatory Visit: Payer: Self-pay

## 2018-01-16 ENCOUNTER — Emergency Department (HOSPITAL_COMMUNITY)
Admission: EM | Admit: 2018-01-16 | Discharge: 2018-01-16 | Disposition: A | Discharge status: Home / Self Care | Payer: Medicare Other | Attending: Emergency Medicine | Admitting: Emergency Medicine

## 2018-01-16 DIAGNOSIS — Z79899 Other long term (current) drug therapy: Secondary | ICD-10-CM | POA: Diagnosis not present

## 2018-01-16 DIAGNOSIS — N183 Chronic kidney disease, stage 3 (moderate): Secondary | ICD-10-CM | POA: Insufficient documentation

## 2018-01-16 DIAGNOSIS — G309 Alzheimer's disease, unspecified: Secondary | ICD-10-CM | POA: Insufficient documentation

## 2018-01-16 DIAGNOSIS — R55 Syncope and collapse: Secondary | ICD-10-CM | POA: Insufficient documentation

## 2018-01-16 DIAGNOSIS — Q909 Down syndrome, unspecified: Secondary | ICD-10-CM | POA: Insufficient documentation

## 2018-01-16 DIAGNOSIS — R21 Rash and other nonspecific skin eruption: Secondary | ICD-10-CM | POA: Diagnosis not present

## 2018-01-16 DIAGNOSIS — R031 Nonspecific low blood-pressure reading: Secondary | ICD-10-CM | POA: Diagnosis not present

## 2018-01-16 DIAGNOSIS — F0280 Dementia in other diseases classified elsewhere without behavioral disturbance: Secondary | ICD-10-CM | POA: Insufficient documentation

## 2018-01-16 DIAGNOSIS — L539 Erythematous condition, unspecified: Secondary | ICD-10-CM | POA: Diagnosis not present

## 2018-01-16 LAB — CBC WITH DIFFERENTIAL/PLATELET
Basophils Absolute: 0 10*3/uL (ref 0.0–0.1)
Basophils Relative: 0 %
Eosinophils Absolute: 0 10*3/uL (ref 0.0–0.7)
Eosinophils Relative: 0 %
HCT: 38.4 % — ABNORMAL LOW (ref 39.0–52.0)
Hemoglobin: 12.4 g/dL — ABNORMAL LOW (ref 13.0–17.0)
Lymphocytes Relative: 14 %
Lymphs Abs: 1.3 10*3/uL (ref 0.7–4.0)
MCH: 33 pg (ref 26.0–34.0)
MCHC: 32.3 g/dL (ref 30.0–36.0)
MCV: 102.1 fL — ABNORMAL HIGH (ref 78.0–100.0)
Monocytes Absolute: 0.2 10*3/uL (ref 0.1–1.0)
Monocytes Relative: 2 %
Neutro Abs: 8.3 10*3/uL — ABNORMAL HIGH (ref 1.7–7.7)
Neutrophils Relative %: 84 %
Platelets: 234 10*3/uL (ref 150–400)
RBC: 3.76 MIL/uL — ABNORMAL LOW (ref 4.22–5.81)
RDW: 14.6 % (ref 11.5–15.5)
WBC: 9.8 10*3/uL (ref 4.0–10.5)

## 2018-01-16 LAB — BASIC METABOLIC PANEL
Anion gap: 7 (ref 5–15)
BUN: 24 mg/dL — ABNORMAL HIGH (ref 6–20)
CO2: 25 mmol/L (ref 22–32)
Calcium: 8.3 mg/dL — ABNORMAL LOW (ref 8.9–10.3)
Chloride: 106 mmol/L (ref 101–111)
Creatinine, Ser: 1.33 mg/dL — ABNORMAL HIGH (ref 0.61–1.24)
GFR calc Af Amer: >60 mL/min (ref >60)
GFR calc non Af Amer: 55 mL/min — ABNORMAL LOW (ref >60)
Glucose, Bld: 105 mg/dL — ABNORMAL HIGH (ref 65–99)
Potassium: 4.4 mmol/L (ref 3.5–5.1)
Sodium: 138 mmol/L (ref 135–145)

## 2018-01-16 LAB — TROPONIN I: Troponin I: <0.03 ng/mL (ref <0.03)

## 2018-01-16 MED ORDER — SODIUM CHLORIDE 0.9 % IV BOLUS
1000.0000 mL | Freq: Once | INTRAVENOUS | Status: AC
  Administered 2018-01-16: 1000 mL via INTRAVENOUS

## 2018-01-16 MED ORDER — CLOTRIMAZOLE 1 % EX CREA: TOPICAL_CREAM | CUTANEOUS | 0 refills | Status: AC

## 2018-01-16 NOTE — Discharge Instructions (Addendum)
Increase amount of fluids at home.  Please follow-up with primary care doctor in 2 days for recheck.  Please return to the emergency department if you develop any new or worsening symptoms including repeat episodes, fever, or any other new or concerning symptom.

## 2018-01-16 NOTE — ED Triage Notes (Signed)
Patient was in shower with staff member and patient tripped in shower. Patient placed in bathing chair and eyes were noted "rolling back in his head".

## 2018-01-16 NOTE — ED Provider Notes (Signed)
[place] EMERGENCY DEPARTMENT Provider Note   CSN: [id number] Arrival date & time: 01/16/18  0941     History   Chief Complaint Chief Complaint  Patient presents with  . Near Syncope    HPI Dajon Lazar is a 62 y.o. male with history of Down syndrome, Alzheimer's dementia, CKD who presents following episode of reported near syncope.  Patient was in the shower with a staff member from his group home when he fell backwards.  Staff member called him and had him sit down in the waiting chair and patient's eyes rolled back in his head.  He slumped over a little bit.  He did not lose consciousness.  Per staff member, patient is at his baseline now.  Per EMS, blood pressure low.  Patient has no history of  Hypotension.  Per chart review, no history of hypertension either.  Patient reportedly has no history of stroke or seizure.  HPI  Past Medical History:  Diagnosis Date  . Abnormal EKG    HX LEFT BUNDLE BRANCH BLOCK ON 01-11-2004 EKG ON CHART, NORMAL STRESS TEST 2005  . Bladder mass    noted per ct 10/ 2017  . Cholelithiasis    one small gallstone noted per ct 10/ 2017   . CKD (chronic kidney disease), stage III (HCC)    PER PCP NOTE DR HUSAIN (last GFR 52)  --11/06/2016 CKD STAGE 3   . Down syndrome    high function  . Down's syndrome 04/13/2017  . Family history of liver cancer    both of his sister's died from liver cancer  . Foot deformity   . GERD (gastroesophageal reflux disease)   . Gout   . Gouty arthritis    last flare-up  approx. 11/ 2017  . History of Helicobacter pylori infection    followed by dr Michail [facility])  . HOH (hard of hearing)   . Hyperlipidemia   . Left bundle branch block (LBBB)    per pcp note dr Lysle Rubens LBBB  has been a known before (11-06-2016)  . Lives in independent group home pt lived at home w/ parents until deceased then his 2 sisters were his caregivers until they both decreased -- he now lives at independent group home since  2017--   [name] is name of home (independent w/ guidance, communicates well) and is monitored medically by [facility] [facility]  . Memory disorder 04/13/2017  . OA (osteoarthritis)     Patient Active Problem List   Diagnosis Date Noted  . Down's syndrome 04/13/2017  . Memory disorder 04/13/2017  . HOH (hard of hearing) 04/13/2017  . Generalized abdominal pain 12/23/2016  . Abnormal findings-gastrointestinal tract 12/23/2016    Past Surgical History:  Procedure Laterality Date  . COLONOSCOPY  03/13/2010  . CYSTOSCOPY W/ RETROGRADES Bilateral 11/09/2016   Procedure: CYSTOSCOPY WITH RETROGRADE PYELOGRAM;  Surgeon: Cleon Gustin, MD;  Location: [facility];  Service: Urology;  Laterality: Bilateral;  . CYSTOSCOPY WITH BIOPSY N/A 11/09/2016   Procedure: CYSTOSCOPY WITH  BLADDER BIOPSY, FULGERATION;  Surgeon: Cleon Gustin, MD;  Location: [facility];  Service: Urology;  Laterality: N/A;  . ESOPHAGOGASTRODUODENOSCOPY (EGD) WITH PROPOFOL N/A 12/23/2016   Procedure: ESOPHAGOGASTRODUODENOSCOPY (EGD) WITH PROPOFOL;  Surgeon: Wilford Corner, MD;  Location: [facility];  Service: Endoscopy;  Laterality: N/A;        Home Medications    Prior to Admission medications   Medication Sig Start Date End Date Taking? Authorizing Provider  acetaminophen (TYLENOL) 500 MG tablet Take 500-1,000 mg by mouth every 6 (six) hours as needed for moderate pain.    Yes [provider]  allopurinol (ZYLOPRIM) 300 MG tablet Take 300 mg by mouth every morning.   Yes [provider]  colchicine 0.6 MG tablet Take 0.6 mg by mouth 2 (two) times daily as needed (gout flare). For up to 7 days   Yes [provider]  donepezil (ARICEPT) 10 MG tablet Take 10 mg by mouth at bedtime.   Yes [provider]  ENSURE PLUS (ENSURE PLUS) LIQD Take 237 mLs by mouth 2 (two) times daily between meals.   Yes [provider]  Lutein 20 MG TABS  Take 1 tablet by mouth 2 (two) times daily.    Yes [provider]  mirabegron ER (MYRBETRIQ) 25 MG TB24 tablet Take 25 mg by mouth daily.   Yes [provider]  Multiple Vitamins-Minerals (MULTIVITAMIN WITH MINERALS) tablet Take 1 tablet by mouth every morning.    Yes [provider]  ranitidine (ZANTAC) 300 MG capsule Take 300 mg by mouth at bedtime.    Yes [provider]  simvastatin (ZOCOR) 20 MG tablet Take 20 mg by mouth every evening.    Yes [provider]  tamsulosin (FLOMAX) 0.4 MG CAPS capsule Take 0.4 mg by mouth at bedtime.   Yes [provider]  triamcinolone cream (KENALOG) 0.1 % Apply 1 application topically 2 (two) times daily as needed (rash).    Yes [provider]  clotrimazole (LOTRIMIN) 1 % cream Apply to affected area 2 times daily 01/16/18   Fredie Majano, Bea Graff, PA-C  donepezil (ARICEPT) 5 MG tablet Take 1 tablet (5 mg total) by mouth at bedtime. Patient not taking: Reported on 01/16/2018 04/13/17   Kathrynn Ducking, MD  gentamicin cream (GARAMYCIN) 0.1 % Apply 1 application topically 3 (three) times daily. Patient not taking: Reported on 01/16/2018 09/22/17   Edrick Kins, DPM    Family History No family history on file.  Social History Social History   Tobacco Use  . Smoking status: Never Smoker  . Smokeless tobacco: Never Used  Substance Use Topics  . Alcohol use: No  . Drug use: No     Allergies   Amoxicillin and Nsaids   Review of Systems Review of Systems  Unable to perform ROS: Patient nonverbal     Physical Exam Updated Vital Signs BP 96/63   Pulse 84   Temp (S) 97.8 F (36.6 C) (Oral)   Resp 15   Ht 5\' 8"  (1.727 m)   Wt 74.8 kg (165 lb)   SpO2 100%   BMI 25.09 kg/m   Physical Exam  Constitutional: He appears well-developed and well-nourished. No distress.  HENT:  Head: Normocephalic and atraumatic.  Mouth/Throat: Oropharynx is clear and moist. No oropharyngeal exudate.    Eyes: Pupils are equal, round, and reactive to light. Conjunctivae are normal. Right eye exhibits no discharge. Left eye exhibits no discharge. No scleral icterus.  Neck: Normal range of motion. Neck supple. No thyromegaly present.  Cardiovascular: Normal rate, regular rhythm, normal heart sounds and intact distal pulses. Exam reveals no gallop and no friction rub.  No murmur heard. Pulmonary/Chest: Effort normal and breath sounds normal. No stridor. No respiratory distress. He has no wheezes. He has no rales.  Abdominal: Soft. Bowel sounds are normal. He exhibits no distension. There is no tenderness. There is no rebound and no guarding.  Musculoskeletal: He exhibits  no edema.  Lymphadenopathy:    He has no cervical adenopathy.  Neurological: He is alert. Coordination normal.  Patient nonverbal and cannot follow commands at baseline, cannot complete neuro exam  Skin: Skin is warm and dry. No rash noted. He is not diaphoretic. No pallor.  Erythematous rash to genital/suprapubic region under Depend, satellite lesions  Psychiatric: He has a normal mood and affect.  Nursing note and vitals reviewed.    ED Treatments / Results  Labs (all labs ordered are listed, but only abnormal results are displayed) Labs Reviewed  BASIC METABOLIC PANEL - Abnormal; Notable for the following components:      Result Value   Glucose, Bld 105 (*)    BUN 24 (*)    Creatinine, Ser 1.33 (*)    Calcium 8.3 (*)    GFR calc non Af Amer 55 (*)    All other components within normal limits  CBC WITH DIFFERENTIAL/PLATELET - Abnormal; Notable for the following components:   RBC 3.76 (*)    Hemoglobin 12.4 (*)    HCT 38.4 (*)    MCV 102.1 (*)    Neutro Abs 8.3 (*)    All other components within normal limits  TROPONIN I  URINALYSIS, ROUTINE W REFLEX MICROSCOPIC    EKG EKG Interpretation  Date/Time:  "Sunday January 16 2018 09:51:23 EDT Ventricular Rate:  86 PR Interval:    QRS Duration: 124 QT  Interval:  419 QTC Calculation: 502 R Axis:   52 Text Interpretation:  Sinus rhythm Left bundle branch block Confirmed by Pickering, Nathan ([id number]) on 01/16/2018 11:09:29 AM   Radiology No results found.  Procedures Procedures (including critical care time)  Medications Ordered in ED Medications  sodium chloride 0.9 % bolus 1,000 mL (0 mLs Intravenous Stopped 01/16/18 1210)  sodium chloride 0.9 % bolus 1,000 mL (0 mLs Intravenous Stopped 01/16/18 1510)     Initial Impression / Assessment and Plan / ED Course  I have reviewed the triage vital signs and the nursing notes.  Pertinent labs & imaging results that were available during my care of the patient were reviewed by me and considered in my medical decision making (see chart for details).     Patient presenting after hypotensive episode.  Patient has baseline low blood pressures, Dr. Goldston nurse at the group home.  She reports patient has  systolic pressures typically between 90 and 110.  Per chart review, patient has had blood pressures at other office visits around that as well.  Patient's blood pressure here 96/63 after 1 L of fluids.  Patient pulled out IV after first.  We are unable to obtain UA.  Will treat rash to Depend area with clotrimazole.  Considering patient is back to baseline and blood pressures are stable, will discharge home with strict return precautions and close follow-up to PCP.  Patient's staff member present with him understands and agrees with plan.  Patient also evaluated by Dr. Pickering who guided the patient's management and agrees with plan.  Final Clinical Impressions(s) / ED Diagnoses   Final diagnoses:  Near syncope    ED Discharge Orders        Ordered    clotrimazole (LOTRIMIN) 1 % cream     04" /21/19 [place], Bea Graff, PA-C 01/16/18 1614    Davonna Belling, MD 01/18/18 1534

## 2018-01-16 NOTE — ED Notes (Signed)
Unable to take vital signs because patient could not be redirected. Patient also pulled out his IV approx 30 minutes prior to discharge.

## 2018-01-16 NOTE — ED Notes (Signed)
Per staff assistant patient is at normal baseline at this time.

## 2018-01-16 NOTE — ED Notes (Signed)
Patient not allowing staff member to redirect him re: keeping arm straight for fluid bolus. Staff member states she will try again.

## 2018-01-16 NOTE — ED Notes (Signed)
Orthostatic vital signs attempted, only able to check when laying and sitting, pt refused to stand.

## 2018-01-18 DIAGNOSIS — M25562 Pain in left knee: Secondary | ICD-10-CM | POA: Diagnosis not present

## 2018-01-18 DIAGNOSIS — R55 Syncope and collapse: Secondary | ICD-10-CM | POA: Diagnosis not present

## 2018-01-18 DIAGNOSIS — L509 Urticaria, unspecified: Secondary | ICD-10-CM | POA: Diagnosis not present

## 2018-01-18 DIAGNOSIS — I959 Hypotension, unspecified: Secondary | ICD-10-CM | POA: Diagnosis not present

## 2018-01-18 DIAGNOSIS — E860 Dehydration: Secondary | ICD-10-CM | POA: Diagnosis not present

## 2018-01-18 DIAGNOSIS — M25561 Pain in right knee: Secondary | ICD-10-CM | POA: Diagnosis not present

## 2018-02-14 DIAGNOSIS — H903 Sensorineural hearing loss, bilateral: Secondary | ICD-10-CM | POA: Diagnosis not present

## 2018-03-07 DIAGNOSIS — H9193 Unspecified hearing loss, bilateral: Secondary | ICD-10-CM | POA: Diagnosis not present

## 2018-04-07 DIAGNOSIS — H9193 Unspecified hearing loss, bilateral: Secondary | ICD-10-CM | POA: Diagnosis not present

## 2018-04-07 DIAGNOSIS — Z01118 Encounter for examination of ears and hearing with other abnormal findings: Secondary | ICD-10-CM | POA: Diagnosis not present

## 2018-04-07 DIAGNOSIS — Z886 Allergy status to analgesic agent status: Secondary | ICD-10-CM | POA: Diagnosis not present

## 2018-04-07 DIAGNOSIS — H9190 Unspecified hearing loss, unspecified ear: Secondary | ICD-10-CM | POA: Diagnosis not present

## 2018-04-07 DIAGNOSIS — Z880 Allergy status to penicillin: Secondary | ICD-10-CM | POA: Diagnosis not present

## 2018-04-18 ENCOUNTER — Ambulatory Visit: Payer: Medicare Other | Admitting: Adult Health

## 2018-04-18 NOTE — Progress Notes (Deleted)
PATIENT: Sean Murray DOB: 11/11/1955  REASON FOR VISIT: follow up HISTORY FROM: patient  HISTORY OF PRESENT ILLNESS: Today 04/18/18  HISTORY 10/14/17 Sean Murray is a 62 year old male with a history of memory disturbance and Down syndrome.  He returns today for follow-up.  He is here today with his caregiver.  She reports that she has noticed more trouble with him completing tasks such as fastening a seatbelt.  He is able to complete some ADLs independently but does require supervision most of the time.  Caregiver reports that he does have a good appetite.  In regards to his sleep he has good nights and bad nights.  She states that the episodes of wandering off has decreased.  He is currently on Aricept 10 mg at bedtime.  She reports occasionally he will become agitated if he does not want to do something.  He returns today for an evaluation.    REVIEW OF SYSTEMS: Out of a complete 14 system review of symptoms, the patient complains only of the following symptoms, and all other reviewed systems are negative.  ALLERGIES: Allergies  Allergen Reactions  . Amoxicillin Rash    Unsure wasn't listed on [facility] 01/29/17.  Severe rash  . Nsaids Other (See Comments)    Avoid NSAIDS--  Strong family hx liver cancer    HOME MEDICATIONS: Outpatient Medications Prior to Visit  Medication Sig Dispense Refill  . acetaminophen (TYLENOL) 500 MG tablet Take 500-1,000 mg by mouth every 6 (six) hours as needed for moderate pain.     Marland Kitchen allopurinol (ZYLOPRIM) 300 MG tablet Take 300 mg by mouth every morning.    . clotrimazole (LOTRIMIN) 1 % cream Apply to affected area 2 times daily 15 g 0  . colchicine 0.6 MG tablet Take 0.6 mg by mouth 2 (two) times daily as needed (gout flare). For up to 7 days    . donepezil (ARICEPT) 10 MG tablet Take 10 mg by mouth at bedtime.    . donepezil (ARICEPT) 5 MG tablet Take 1 tablet (5 mg total) by mouth at bedtime. (Patient not taking: Reported on 01/16/2018) 30 tablet 1  .  ENSURE PLUS (ENSURE PLUS) LIQD Take 237 mLs by mouth 2 (two) times daily between meals.    Marland Kitchen gentamicin cream (GARAMYCIN) 0.1 % Apply 1 application topically 3 (three) times daily. (Patient not taking: Reported on 01/16/2018) 30 g 1  . Lutein 20 MG TABS Take 1 tablet by mouth 2 (two) times daily.     . mirabegron ER (MYRBETRIQ) 25 MG TB24 tablet Take 25 mg by mouth daily.    . Multiple Vitamins-Minerals (MULTIVITAMIN WITH MINERALS) tablet Take 1 tablet by mouth every morning.     . ranitidine (ZANTAC) 300 MG capsule Take 300 mg by mouth at bedtime.     . simvastatin (ZOCOR) 20 MG tablet Take 20 mg by mouth every evening.     . tamsulosin (FLOMAX) 0.4 MG CAPS capsule Take 0.4 mg by mouth at bedtime.    . triamcinolone cream (KENALOG) 0.1 % Apply 1 application topically 2 (two) times daily as needed (rash).      No facility-administered medications prior to visit.     PAST MEDICAL HISTORY: Past Medical History:  Diagnosis Date  . Abnormal EKG    HX LEFT BUNDLE BRANCH BLOCK ON 01-11-2004 EKG ON CHART, NORMAL STRESS TEST 2005  . Bladder mass    noted per ct 10/ 2017  . Cholelithiasis    one small gallstone noted  per ct 10/ 2017   . CKD (chronic kidney disease), stage III (HCC)    PER PCP NOTE DR HUSAIN (last GFR 52)  --11/06/2016 CKD STAGE 3   . Down syndrome    high function  . Down's syndrome 04/13/2017  . Family history of liver cancer    both of his sister's died from liver cancer  . Foot deformity   . GERD (gastroesophageal reflux disease)   . Gout   . Gouty arthritis    last flare-up  approx. 11/ 2017  . History of Helicobacter pylori infection    followed by dr Michail [facility])  . HOH (hard of hearing)   . Hyperlipidemia   . Left bundle branch block (LBBB)    per pcp note dr Lysle Rubens LBBB  has been a known before (11-06-2016)  . Lives in independent group home pt lived at home w/ parents until deceased then his 2 sisters were his caregivers until they both decreased -- he now  lives at independent group home since 2017--   [name] is name of home (independent w/ guidance, communicates well) and is monitored medically by [employer]  . Memory disorder 04/13/2017  . OA (osteoarthritis)     PAST SURGICAL HISTORY: Past Surgical History:  Procedure Laterality Date  . COLONOSCOPY  03/13/2010  . CYSTOSCOPY W/ RETROGRADES Bilateral 11/09/2016   Procedure: CYSTOSCOPY WITH RETROGRADE PYELOGRAM;  Surgeon: Cleon Gustin, MD;  [facility];  Service: Urology;  Laterality: Bilateral;  . CYSTOSCOPY WITH BIOPSY N/A 11/09/2016   Procedure: CYSTOSCOPY WITH  BLADDER BIOPSY, FULGERATION;  Surgeon: Cleon Gustin, MD;  Location: [facility];  Service: Urology;  Laterality: N/A;  . ESOPHAGOGASTRODUODENOSCOPY (EGD) WITH PROPOFOL N/A 12/23/2016   Procedure: ESOPHAGOGASTRODUODENOSCOPY (EGD) WITH PROPOFOL;  Surgeon: Wilford Corner, MD;  Location: [facility];  Service: Endoscopy;  Laterality: N/A;    FAMILY HISTORY: No family history on file.  SOCIAL HISTORY: Social History   Socioeconomic History  . Marital status: Single    Spouse name: Not on file  . Number of children: Not on file  . Years of education: Not on file  . Highest education level: Not on file  Occupational History  . Not on file  Social Needs  . Financial resource strain: Not on file  . Food insecurity:    Worry: Not on file    Inability: Not on file  . Transportation needs:    Medical: Not on file    Non-medical: Not on file  Tobacco Use  . Smoking status: Never Smoker  . Smokeless tobacco: Never Used  Substance and Sexual Activity  . Alcohol use: No  . Drug use: No  . Sexual activity: Not on file  Lifestyle  . Physical activity:    Days per week: Not on file    Minutes per session: Not on file  . Stress: Not on file  Relationships  . Social connections:    Talks on phone: Not on file    Gets together: Not on file    Attends religious  service: Not on file    Active member of club or organization: Not on file    Attends meetings of clubs or organizations: Not on file    Relationship status: Not on file  . Intimate partner violence:    Fear of current or ex partner: Not on file    Emotionally abused: Not on file    Physically abused: Not on file  Forced sexual activity: Not on file  Other Topics Concern  . Not on file  Social History Narrative   Lives at [facility]   Caffeine use: none      PHYSICAL EXAM  There were no vitals filed for this visit. There is no height or weight on file to calculate BMI.  Generalized: Well developed, in no acute distress   Neurological examination  Mentation: Alert oriented to time, place, history taking. Follows all commands speech and language fluent Cranial nerve II-XII: Pupils were equal round reactive to light. Extraocular movements were full, visual field were full on confrontational test. Facial sensation and strength were normal. Uvula tongue midline. Head turning and shoulder shrug  were normal and symmetric. Motor: The motor testing reveals 5 over 5 strength of all 4 extremities. Good symmetric motor tone is noted throughout.  Sensory: Sensory testing is intact to soft touch on all 4 extremities. No evidence of extinction is noted.  Coordination: Cerebellar testing reveals good finger-nose-finger and heel-to-shin bilaterally.  Gait and station: Gait is normal. Tandem gait is normal. Romberg is negative. No drift is seen.  Reflexes: Deep tendon reflexes are symmetric and normal bilaterally.   DIAGNOSTIC DATA (LABS, IMAGING, TESTING) - I reviewed patient records, labs, notes, testing and imaging myself where available.  Lab Results  Component Value Date   WBC 9.8 01/16/2018   HGB 12.4 (L) 01/16/2018   HCT 38.4 (L) 01/16/2018   MCV 102.1 (H) 01/16/2018   PLT 234 01/16/2018      Component Value Date/Time   NA 138 01/16/2018  0955   K 4.4 01/16/2018 0955   CL 106 01/16/2018 0955   CO2 25 01/16/2018 0955   GLUCOSE 105 (H) 01/16/2018 0955   BUN 24 (H) 01/16/2018 0955   CREATININE 1.33 (H) 01/16/2018 0955   CALCIUM 8.3 (L) 01/16/2018 0955   PROT 6.5 07/07/2016 2304   ALBUMIN 3.4 (L) 07/07/2016 2304   AST 22 07/07/2016 2304   ALT 14 (L) 07/07/2016 2304   ALKPHOS 73 07/07/2016 2304   BILITOT 0.9 07/07/2016 2304   GFRNONAA 55 (L) 01/16/2018 0955   GFRAA >60 01/16/2018 0955   No results found for: CHOL, HDL, LDLCALC, LDLDIRECT, TRIG, CHOLHDL No results found for: HGBA1C Lab Results  Component Value Date   VITAMINB12 379 04/13/2017   Lab Results  Component Value Date   TSH 5.580 (H) 04/13/2017      ASSESSMENT AND PLAN 63 y.o. year old male  has a past medical history of Abnormal EKG, Bladder mass, Cholelithiasis, CKD (chronic kidney disease), stage III ([place]), Down syndrome, Down's syndrome (04/13/2017), Family history of liver cancer, Foot deformity, GERD (gastroesophageal reflux disease), Gout, Gouty arthritis, History of Helicobacter pylori infection, HOH (hard of hearing), Hyperlipidemia, Left bundle branch block (LBBB), Lives in independent group home (pt lived at home w/ parents until deceased then his 2 sisters were his caregivers until they both decreased -- he now lives at independent group home since 2017--), Memory disorder (04/13/2017), and OA (osteoarthritis). here with ***   I spent 15 minutes with the patient. 50% of this time was spent   Ward Givens, MSN, NP-C 04/18/2018, 1:46 PM [facility] [address], [place], [place] [address] [phone]

## 2018-04-20 ENCOUNTER — Encounter: Payer: Self-pay | Admitting: Adult Health

## 2018-04-20 ENCOUNTER — Ambulatory Visit: Payer: Medicare Other | Admitting: Podiatry

## 2018-04-20 DIAGNOSIS — K219 Gastro-esophageal reflux disease without esophagitis: Secondary | ICD-10-CM | POA: Diagnosis not present

## 2018-04-20 DIAGNOSIS — Q909 Down syndrome, unspecified: Secondary | ICD-10-CM | POA: Diagnosis not present

## 2018-04-20 DIAGNOSIS — F0390 Unspecified dementia without behavioral disturbance: Secondary | ICD-10-CM | POA: Diagnosis not present

## 2018-04-20 DIAGNOSIS — N183 Chronic kidney disease, stage 3 (moderate): Secondary | ICD-10-CM | POA: Diagnosis not present

## 2018-04-20 DIAGNOSIS — E7800 Pure hypercholesterolemia, unspecified: Secondary | ICD-10-CM | POA: Diagnosis not present

## 2018-04-20 DIAGNOSIS — R32 Unspecified urinary incontinence: Secondary | ICD-10-CM | POA: Diagnosis not present

## 2018-04-20 DIAGNOSIS — I8310 Varicose veins of unspecified lower extremity with inflammation: Secondary | ICD-10-CM | POA: Diagnosis not present

## 2018-04-27 ENCOUNTER — Encounter: Payer: Self-pay | Admitting: Podiatry

## 2018-04-27 ENCOUNTER — Ambulatory Visit (INDEPENDENT_AMBULATORY_CARE_PROVIDER_SITE_OTHER): Payer: Medicare Other | Admitting: Podiatry

## 2018-04-27 DIAGNOSIS — B351 Tinea unguium: Secondary | ICD-10-CM

## 2018-04-27 DIAGNOSIS — M79676 Pain in unspecified toe(s): Secondary | ICD-10-CM | POA: Diagnosis not present

## 2018-04-27 NOTE — Progress Notes (Addendum)
This patient    General Appearance  Alert, conversant and in no acute stress.  Vascular  Dorsalis pedis and posterior pulses are palpable  bilaterally.  Capillary return is within normal limits  bilaterally. Temperature is within normal limits  Bilaterally.   Neurologic  Senn-Weinstein monofilament wire test absent.  bilaterally. Muscle power within normal limits bilaterally.  Nails Thick disfigured discolored nails with subungual debris bilaterally from hallux to fifth toes bilaterally. No evidence of bacterial infection or drainage bilaterally.  Orthopedic  No limitations of motion of motion feet bilaterally.  No crepitus or effusions noted.  Severe HAV  B/L.  Hammer toes 2-5 B/L.  Skin  normotropic skin with no porokeratosis noted bilaterally.  No signs of infections or ulcers noted.  Pinhole on the right ankle medial malleolus has closed.  Porokeratosis sub 4th left and sub 5th right foot.   Onychomycosis  B/L    ROV.  Debridement of onychomycotic nails 10.   Marland Kitchen ABN signed for 2019. RTC 3 months.   Gardiner Barefoot DPM

## 2018-05-23 ENCOUNTER — Emergency Department (HOSPITAL_COMMUNITY): Payer: Medicare Other

## 2018-05-23 ENCOUNTER — Emergency Department (HOSPITAL_COMMUNITY)
Admission: EM | Admit: 2018-05-23 | Discharge: 2018-05-23 | Disposition: A | Discharge status: Home / Self Care | Payer: Medicare Other | Attending: Emergency Medicine | Admitting: Emergency Medicine

## 2018-05-23 DIAGNOSIS — Z79899 Other long term (current) drug therapy: Secondary | ICD-10-CM | POA: Insufficient documentation

## 2018-05-23 DIAGNOSIS — E860 Dehydration: Secondary | ICD-10-CM | POA: Diagnosis not present

## 2018-05-23 DIAGNOSIS — R55 Syncope and collapse: Secondary | ICD-10-CM | POA: Diagnosis not present

## 2018-05-23 DIAGNOSIS — S0990XA Unspecified injury of head, initial encounter: Secondary | ICD-10-CM | POA: Diagnosis not present

## 2018-05-23 DIAGNOSIS — Q909 Down syndrome, unspecified: Secondary | ICD-10-CM | POA: Diagnosis not present

## 2018-05-23 DIAGNOSIS — S199XXA Unspecified injury of neck, initial encounter: Secondary | ICD-10-CM | POA: Diagnosis not present

## 2018-05-23 DIAGNOSIS — N183 Chronic kidney disease, stage 3 (moderate): Secondary | ICD-10-CM | POA: Insufficient documentation

## 2018-05-23 LAB — BASIC METABOLIC PANEL
Anion gap: 5 (ref 5–15)
BUN: 20 mg/dL (ref 8–23)
CO2: 30 mmol/L (ref 22–32)
Calcium: 8.6 mg/dL — ABNORMAL LOW (ref 8.9–10.3)
Chloride: 106 mmol/L (ref 98–111)
Creatinine, Ser: 1.28 mg/dL — ABNORMAL HIGH (ref 0.61–1.24)
GFR calc Af Amer: >60 mL/min (ref >60)
GFR calc non Af Amer: 58 mL/min — ABNORMAL LOW (ref >60)
Glucose, Bld: 92 mg/dL (ref 70–99)
Potassium: 3.9 mmol/L (ref 3.5–5.1)
Sodium: 141 mmol/L (ref 135–145)

## 2018-05-23 LAB — CBC WITH DIFFERENTIAL/PLATELET
Abs Immature Granulocytes: 0 10*3/uL (ref 0.0–0.1)
Basophils Absolute: 0 10*3/uL (ref 0.0–0.1)
Basophils Relative: 1 %
Eosinophils Absolute: 0.1 10*3/uL (ref 0.0–0.7)
Eosinophils Relative: 1 %
HCT: 38.6 % — ABNORMAL LOW (ref 39.0–52.0)
Hemoglobin: 12 g/dL — ABNORMAL LOW (ref 13.0–17.0)
Immature Granulocytes: 1 %
Lymphocytes Relative: 23 %
Lymphs Abs: 1.1 10*3/uL (ref 0.7–4.0)
MCH: 32.1 pg (ref 26.0–34.0)
MCHC: 31.1 g/dL (ref 30.0–36.0)
MCV: 103.2 fL — ABNORMAL HIGH (ref 78.0–100.0)
Monocytes Absolute: 0.5 10*3/uL (ref 0.1–1.0)
Monocytes Relative: 11 %
Neutro Abs: 3.1 10*3/uL (ref 1.7–7.7)
Neutrophils Relative %: 63 %
Platelets: 199 10*3/uL (ref 150–400)
RBC: 3.74 MIL/uL — ABNORMAL LOW (ref 4.22–5.81)
RDW: 15.3 % (ref 11.5–15.5)
WBC: 4.9 10*3/uL (ref 4.0–10.5)

## 2018-05-23 LAB — URINALYSIS, ROUTINE W REFLEX MICROSCOPIC
Bilirubin Urine: NEGATIVE
Glucose, UA: NEGATIVE mg/dL
Hgb urine dipstick: NEGATIVE
Ketones, ur: NEGATIVE mg/dL
Leukocytes, UA: NEGATIVE
Nitrite: NEGATIVE
Protein, ur: NEGATIVE mg/dL
Specific Gravity, Urine: 1.012 (ref 1.005–1.030)
pH: 8 (ref 5.0–8.0)

## 2018-05-23 MED ORDER — SODIUM CHLORIDE 0.9 % IV BOLUS
1000.0000 mL | Freq: Once | INTRAVENOUS | Status: AC
  Administered 2018-05-23: 1000 mL via INTRAVENOUS

## 2018-05-23 NOTE — ED Triage Notes (Signed)
Per [facility]: Patient to ED from group home, [facility], for possible near-syncope. Patient was in the restroom and caregiver left to get a towel (gone for approximately 30 seconds), and upon returning, patient was found sitting on the floor. History of Down Syndrome. Patient alert and oriented to baseline, unknown LOC. Patient was found to be incontinent of urine, but caregiver states that is not abnormal for patient. Abrasion and hematoma noted to L side of forehead, patient rubbing the area upon arrival. He is calm and cooperative at this time. Pupils equal, reactive. No other signs of injury.

## 2018-05-23 NOTE — ED Notes (Signed)
BP 116/71

## 2018-05-23 NOTE — ED Notes (Signed)
Caregiver at bedside. Patient calm and cooperative. Appears to be in no distress at this time.

## 2018-05-23 NOTE — Discharge Instructions (Addendum)
Try to push fluids as much as possible.  Please return the emergency department if you develop any new or worsening symptoms.

## 2018-05-23 NOTE — ED Notes (Signed)
Fluids running in and sitter at bedside

## 2018-05-23 NOTE — ED Provider Notes (Signed)
[facility] EMERGENCY DEPARTMENT Provider Note   CSN: [id number] Arrival date & time: 05/23/18  0700     History   Chief Complaint Chief Complaint  Patient presents with  . Near Syncope    HPI Level 5 caveat secondary to nonverbal patient  Sean Murray is a 62 y.o. male with history of Down syndrome who is nonverbal, GERD, gout, left bundle branch block, CKD stage III who presents following possible fall.  Reportedly, caregivers a group home were getting patient up for the day when he sat up in bed and passed out. He fell on the floor and hit his head. HE then shook for about 30 seconds like he was having a seizure. His eyes reportedly rolled back in his head. He quickly returned to baseline when EMS arrived. He has hematoma on his head.  He has no history of seizure.   HPI  Past Medical History:  Diagnosis Date  . Abnormal EKG    HX LEFT BUNDLE BRANCH BLOCK ON 01-11-2004 EKG ON CHART, NORMAL STRESS TEST 2005  . Bladder mass    noted per ct 10/ 2017  . Cholelithiasis    one small gallstone noted per ct 10/ 2017   . CKD (chronic kidney disease), stage III (HCC)    PER PCP NOTE DR HUSAIN (last GFR 52)  --11/06/2016 CKD STAGE 3   . Down syndrome    high function  . Down's syndrome 04/13/2017  . Family history of liver cancer    both of his sister's died from liver cancer  . Foot deformity   . GERD (gastroesophageal reflux disease)   . Gout   . Gouty arthritis    last flare-up  approx. 11/ 2017  . History of Helicobacter pylori infection    followed by dr Michail Sermon [facility])  . HOH (hard of hearing)   . Hyperlipidemia   . Left bundle branch block (LBBB)    per pcp note dr Lysle Rubens LBBB  has been a known before (11-06-2016)  . Lives in independent group home pt lived at home w/ parents until deceased then his 2 sisters were his caregivers until they both decreased -- he now lives at independent group home since 2017--   [name] is name of home (independent w/  guidance, communicates well) and is monitored medically by [employer]  . Memory disorder 04/13/2017  . OA (osteoarthritis)     Patient Active Problem List   Diagnosis Date Noted  . Down's syndrome 04/13/2017  . Memory disorder 04/13/2017  . HOH (hard of hearing) 04/13/2017  . Generalized abdominal pain 12/23/2016  . Abnormal findings-gastrointestinal tract 12/23/2016    Past Surgical History:  Procedure Laterality Date  . COLONOSCOPY  03/13/2010  . CYSTOSCOPY W/ RETROGRADES Bilateral 11/09/2016   Procedure: CYSTOSCOPY WITH RETROGRADE PYELOGRAM;  Surgeon: Cleon Gustin, MD;  Location: [facility];  Service: Urology;  Laterality: Bilateral;  . CYSTOSCOPY WITH BIOPSY N/A 11/09/2016   Procedure: CYSTOSCOPY WITH  BLADDER BIOPSY, FULGERATION;  Surgeon: Cleon Gustin, MD;  Location: [facility];  Service: Urology;  Laterality: N/A;  . ESOPHAGOGASTRODUODENOSCOPY (EGD) WITH PROPOFOL N/A 12/23/2016   Procedure: ESOPHAGOGASTRODUODENOSCOPY (EGD) WITH PROPOFOL;  Surgeon: Wilford Corner, MD;  Location: [facility];  Service: Endoscopy;  Laterality: N/A;        Home Medications    Prior to Admission medications   Medication Sig Start Date End Date Taking? Authorizing Provider  acetaminophen (TYLENOL) 500 MG tablet Take 500-1,000 mg  by mouth every 6 (six) hours as needed for moderate pain.    Yes [provider]  allopurinol (ZYLOPRIM) 300 MG tablet Take 300 mg by mouth every morning.   Yes [provider]  clotrimazole (LOTRIMIN) 1 % cream Apply to affected area 2 times daily Patient taking differently: Apply 1 application topically as needed (skin irritation). Apply to affected area 2 times daily 01/16/18  Yes Dalen Henn[place], Bea Graff, PA-C  colchicine 0.6 MG tablet Take 0.6 mg by mouth 2 (two) times daily as needed (gout flare). For up to 7 days   Yes [provider]  donepezil (ARICEPT) 10 MG tablet Take 10 mg by mouth at  bedtime.   Yes [provider]  ENSURE PLUS (ENSURE PLUS) LIQD Take 237 mLs by mouth 2 (two) times daily between meals.   Yes [provider]  Lutein 20 MG TABS Take 1 tablet by mouth 2 (two) times daily.    Yes [provider]  memantine (NAMENDA) 5 MG tablet Take 5 mg by mouth at bedtime.   Yes [provider]  mirabegron ER (MYRBETRIQ) 25 MG TB24 tablet Take 25 mg by mouth daily.   Yes [provider]  Multiple Vitamins-Minerals (MULTIVITAMIN WITH MINERALS) tablet Take 1 tablet by mouth every morning.    Yes [provider]  ranitidine (ZANTAC) 300 MG capsule Take 300 mg by mouth at bedtime.    Yes [provider]  simvastatin (ZOCOR) 20 MG tablet Take 20 mg by mouth every evening.    Yes [provider]  tamsulosin (FLOMAX) 0.4 MG CAPS capsule Take 0.4 mg by mouth at bedtime.   Yes [provider]  triamcinolone cream (KENALOG) 0.1 % Apply 1 application topically 2 (two) times daily as needed (rash).    Yes [provider]  donepezil (ARICEPT) 5 MG tablet Take 1 tablet (5 mg total) by mouth at bedtime. Patient not taking: Reported on 01/16/2018 04/13/17   Kathrynn Ducking, MD  gentamicin cream (GARAMYCIN) 0.1 % Apply 1 application topically 3 (three) times daily. Patient not taking: Reported on 01/16/2018 09/22/17   Edrick Kins, DPM    Family History No family history on file.  Social History Social History   Tobacco Use  . Smoking status: Never Smoker  . Smokeless tobacco: Never Used  Substance Use Topics  . Alcohol use: No  . Drug use: No     Allergies   Amoxicillin and Nsaids   Review of Systems Review of Systems  Unable to perform ROS: Patient nonverbal     Physical Exam Updated Vital Signs BP 93/74 (BP Location: Right Arm)   Pulse 87   Resp 16   SpO2 94%   Physical Exam  Constitutional: He appears well-developed and well-nourished. No distress.  HENT:  Head:  Normocephalic and atraumatic.    Mouth/Throat: Oropharynx is clear and moist. No oropharyngeal exudate.  Eyes: Pupils are equal, round, and reactive to light. Conjunctivae are normal. Right eye exhibits no discharge. Left eye exhibits no discharge. No scleral icterus.  Neck: Normal range of motion. Neck supple. No thyromegaly present.  Cardiovascular: Normal rate, regular rhythm, normal heart sounds and intact distal pulses. Exam reveals no gallop and no friction rub.  No murmur heard. Pulmonary/Chest: Effort normal and breath sounds normal. No stridor. No respiratory distress. He has no wheezes. He has no rales.  Abdominal: Soft. Bowel sounds are normal. He exhibits no distension. There is no tenderness. There is no rebound and  no guarding.  Musculoskeletal: He exhibits no edema.  No midline cervical, thoracic, or lumbar tenderness  Lymphadenopathy:    He has no cervical adenopathy.  Neurological: He is alert. Coordination normal.  Cannot complete neuro exam as patient is nonverbal and does not follow commands  Skin: Skin is warm and dry. No rash noted. He is not diaphoretic. No pallor.  Darkening of skin to bilateral lower extremities from mid shin to ankles  Psychiatric: He has a normal mood and affect.  Nursing note and vitals reviewed.    ED Treatments / Results  Labs (all labs ordered are listed, but only abnormal results are displayed) Labs Reviewed  BASIC METABOLIC PANEL - Abnormal; Notable for the following components:      Result Value   Creatinine, Ser 1.28 (*)    Calcium 8.6 (*)    GFR calc non Af Amer 58 (*)    All other components within normal limits  CBC WITH DIFFERENTIAL/PLATELET - Abnormal; Notable for the following components:   RBC 3.74 (*)    Hemoglobin 12.0 (*)    HCT 38.6 (*)    MCV 103.2 (*)    All other components within normal limits  URINALYSIS, ROUTINE W REFLEX MICROSCOPIC    EKG EKG Interpretation  Date/Time:  Monday May 23 2018 07:33:20  EDT Ventricular Rate:  73 PR Interval:    QRS Duration: 130 QT Interval:  438 QTC Calculation: 483 R Axis:   69 Text Interpretation:  Sinus rhythm Prolonged PR interval IVCD, consider atypical LBBB Baseline wander in lead(s) V2 no significant change since April 2019 Confirmed by Sherwood Gambler [phone]) on 05/23/2018 7:36:56 AM Also confirmed by Sherwood Gambler [phone]), editor Philomena Doheny [phone])  on 05/23/2018 11:22:38 AM   Radiology Ct Head Wo Contrast  Result Date: 05/23/2018 CLINICAL DATA:  [place] in shower EXAM: CT HEAD WITHOUT CONTRAST CT CERVICAL SPINE WITHOUT CONTRAST TECHNIQUE: Multidetector CT imaging of the head and cervical spine was performed following the standard protocol without intravenous contrast. Multiplanar CT image reconstructions of the cervical spine were also generated. COMPARISON:  CT head 01/29/2017 FINDINGS: CT HEAD FINDINGS Brain: Image quality degraded by motion.  Multiple repeat. Moderate atrophy and moderate ventricular enlargement. Progressive atrophy since the prior study. Chronic white matter hypodensity. Negative for acute infarct, hemorrhage, or mass. Vascular: Negative for hyperdense vessel Skull: Negative for skull fracture.  Left frontal scalp hematoma Sinuses/Orbits: Negative Other: None CT CERVICAL SPINE FINDINGS Alignment: Anterolisthesis of C1 relative to C2 with widening of the atlanto dens interval measuring 4.5 mm. This is unchanged from 01/29/2017 and is associated with arthropathy. Mild retrolisthesis C3-4. Mild anterolisthesis C5-6. 3 mm anterolisthesis C7-T1. Skull base and vertebrae: Negative for fracture Soft tissues and spinal canal: Negative for mass or soft tissue swelling Disc levels: Advanced degenerative change throughout the cervical spine with disc space narrowing and spurring diffusely at all levels. Arthropathy at C1-2 with anterolisthesis of C1 relative to C2, chronic likely due to transverse ligament laxity from arthropathy. Upper chest:  Negative Other: None IMPRESSION: 1. No acute intracranial abnormality. Moderate atrophy which has progressed since 01/29/2017 2. Negative for cervical spine fracture. Advanced degenerative changes throughout the cervical spine as described above. Electronically Signed   By: Franchot Gallo M.D.   On: 05/23/2018 08:56   Ct Cervical Spine Wo Contrast  Result Date: 05/23/2018 CLINICAL DATA:  [place] in shower EXAM: CT HEAD WITHOUT CONTRAST CT CERVICAL SPINE WITHOUT CONTRAST TECHNIQUE: Multidetector CT imaging of the head and cervical spine was  performed following the standard protocol without intravenous contrast. Multiplanar CT image reconstructions of the cervical spine were also generated. COMPARISON:  CT head 01/29/2017 FINDINGS: CT HEAD FINDINGS Brain: Image quality degraded by motion.  Multiple repeat. Moderate atrophy and moderate ventricular enlargement. Progressive atrophy since the prior study. Chronic white matter hypodensity. Negative for acute infarct, hemorrhage, or mass. Vascular: Negative for hyperdense vessel Skull: Negative for skull fracture.  Left frontal scalp hematoma Sinuses/Orbits: Negative Other: None CT CERVICAL SPINE FINDINGS Alignment: Anterolisthesis of C1 relative to C2 with widening of the atlanto dens interval measuring 4.5 mm. This is unchanged from 01/29/2017 and is associated with arthropathy. Mild retrolisthesis C3-4. Mild anterolisthesis C5-6. 3 mm anterolisthesis C7-T1. Skull base and vertebrae: Negative for fracture Soft tissues and spinal canal: Negative for mass or soft tissue swelling Disc levels: Advanced degenerative change throughout the cervical spine with disc space narrowing and spurring diffusely at all levels. Arthropathy at C1-2 with anterolisthesis of C1 relative to C2, chronic likely due to transverse ligament laxity from arthropathy. Upper chest: Negative Other: None IMPRESSION: 1. No acute intracranial abnormality. Moderate atrophy which has progressed since  01/29/2017 2. Negative for cervical spine fracture. Advanced degenerative changes throughout the cervical spine as described above. Electronically Signed   By: Franchot Gallo M.D.   On: 05/23/2018 08:56    Procedures Procedures (including critical care time)  Medications Ordered in ED Medications  sodium chloride 0.9 % bolus 1,000 mL (0 mLs Intravenous Stopped 05/23/18 1047)  sodium chloride 0.9 % bolus 1,000 mL (0 mLs Intravenous Stopped 05/23/18 1245)     Initial Impression / Assessment and Plan / ED Course  I have reviewed the triage vital signs and the nursing notes.  Pertinent labs & imaging results that were available during my care of the patient were reviewed by me and considered in my medical decision making (see chart for details).     Patient presenting after syncopal episode which included shaking episode.  Patient was immediately back to baseline after episode.  Doubt seizure activity, but suspect syncope considering patient's blood pressure patient with history of same and caregivers report patient is monitoring..  Patient given 2 L of fluid in the ED.  Labs unremarkable.  UA shows no infection.  CT head and C-spine show no acute findings.  Encouraged caregivers to push fluids as best as possible.  Follow-up to PCP as needed.  Return precautions discussed.  Caregivers understand and agree with plan.  Patient vitals stable discharge in satisfactory condition. I discussed patient case with Dr. Sherry Ruffing who guided the patient's management and agrees with plan.   Final Clinical Impressions(s) / ED Diagnoses   Final diagnoses:  Syncope and collapse  Dehydration    ED Discharge Orders    None       Frederica Kuster, PA-C 05/24/18 [id number]    Tegeler, Gwenyth Allegra, MD 05/24/18 [phone]

## 2018-05-23 NOTE — ED Notes (Signed)
EDP states okay for caregivers to give patient his morning medications. Patient starting to get agitated and pulling off ECG leads and gauze wrapped around IV. Reinforcing patient to leave IV alone and allowed patient to sit in chair next to bed. Given apple sauce for crushed meds.

## 2018-05-25 ENCOUNTER — Telehealth: Payer: Self-pay | Admitting: Adult Health

## 2018-05-25 DIAGNOSIS — R55 Syncope and collapse: Secondary | ICD-10-CM | POA: Diagnosis not present

## 2018-05-25 DIAGNOSIS — R413 Other amnesia: Secondary | ICD-10-CM | POA: Diagnosis not present

## 2018-05-25 NOTE — Telephone Encounter (Signed)
Sean Murray/Dr Sean Murray @ [facility] [phone] said the pt was seen at ED on 8/26 for seizure like activity. I scheduled an appt with [facility] tomorrow thinking that he had been seen for seizures in the past. Is this pt going to be able to see [place] tomorrow?

## 2018-05-25 NOTE — Telephone Encounter (Addendum)
Can offer 8/30 at 10am with Dr. Jannifer Franklin if they call back. He had a cx

## 2018-05-25 NOTE — Telephone Encounter (Signed)
I called and spoke with Kennyth Lose. Appt has been scheduled for 8/30 @10am 

## 2018-05-25 NOTE — Telephone Encounter (Signed)
I called Dr. Lysle Rubens back and spoke with Joellen Jersey. Pt already left. She requested I call caregiver. I tried calling Kennyth Lose and LVM advising pt unable to see MM,NP since he's coming for new issue. He will have to be seen by Dr. Jannifer Franklin. Offered 730am instead tomorrow with Dr. Jannifer Franklin. Cx appt with MM,NP. Gave [facility] phone number

## 2018-05-25 NOTE — Telephone Encounter (Signed)
I tried calling [phone] to speak with caregiver's but could not reach anyone. Went to [facility].

## 2018-05-25 NOTE — Telephone Encounter (Signed)
Noted, thank you

## 2018-05-26 ENCOUNTER — Ambulatory Visit: Payer: Medicare Other | Admitting: Adult Health

## 2018-05-27 ENCOUNTER — Ambulatory Visit (INDEPENDENT_AMBULATORY_CARE_PROVIDER_SITE_OTHER): Payer: Medicare Other | Admitting: Neurology

## 2018-05-27 ENCOUNTER — Encounter: Payer: Self-pay | Admitting: Neurology

## 2018-05-27 VITALS — BP 100/60 | HR 72 | Ht 68.0 in | Wt 137.0 lb

## 2018-05-27 DIAGNOSIS — R55 Syncope and collapse: Secondary | ICD-10-CM

## 2018-05-27 NOTE — Patient Instructions (Signed)
We will get an EEG study.  Reduce the Aricept to 5 mg at night.

## 2018-05-27 NOTE — Progress Notes (Signed)
Reason for visit: Syncope, Down syndrome  Referring physician: [place]  Sean Murray is a 62 y.o. male  History of present illness:  Sean Murray is a 62 year old right-handed white male with a history of Down syndrome who has had a progressive dementing illness.  The patient lives in an adult home situation, he requires supervision, he is able to feed himself, dress himself, and sometimes bathe himself.  The patient has been on very low-dose Namenda taking 5 mg daily, he has been on Aricept 10 mg daily.  He was seen in the emergency room on 16 January 2018 with a near syncopal event.  On 26 August he went to the emergency room after he had a blackout event.  The patient had jerking of the extremities for about 4 or 5 minutes and then seemed to be in a deep sleep afterwards.  He underwent a CT scan of the brain and cervical spine in the emergency room that did not show any acute changes.  The patient has fully recovered from this event.  He has not had any further blackouts before or since the last ER visit.  The patient is sleeping fairly well, he does have some mild gait instability, he has not had any falls.  He is eating and drinking well.  He is sent to this office for an evaluation.  The patient himself is minimally verbal, does not communicate his needs well.  Past Medical History:  Diagnosis Date  . Abnormal EKG    HX LEFT BUNDLE BRANCH BLOCK ON 01-11-2004 EKG ON CHART, NORMAL STRESS TEST 2005  . Bladder mass    noted per ct 10/ 2017  . Cholelithiasis    one small gallstone noted per ct 10/ 2017   . CKD (chronic kidney disease), stage III (HCC)    PER PCP NOTE DR HUSAIN (last GFR 52)  --11/06/2016 CKD STAGE 3   . Down syndrome    high function  . Down's syndrome 04/13/2017  . Family history of liver cancer    both of his sister's died from liver cancer  . Foot deformity   . GERD (gastroesophageal reflux disease)   . Gout   . Gouty arthritis    last flare-up  approx. 11/ 2017  .  History of Helicobacter pylori infection    followed by dr Michail Sermon [facility])  . HOH (hard of hearing)   . Hyperlipidemia   . Left bundle branch block (LBBB)    per pcp note dr Lysle Rubens LBBB  has been a known before (11-06-2016)  . Lives in independent group home pt lived at home w/ parents until deceased then his 2 sisters were his caregivers until they both decreased -- he now lives at independent group home since 2017--   Sean Murray is name of home (independent w/ guidance, communicates well) and is monitored medically by [employer]  . Memory disorder 04/13/2017  . OA (osteoarthritis)     Past Surgical History:  Procedure Laterality Date  . COLONOSCOPY  03/13/2010  . CYSTOSCOPY W/ RETROGRADES Bilateral 11/09/2016   Procedure: CYSTOSCOPY WITH RETROGRADE PYELOGRAM;  Surgeon: Cleon Gustin, MD;  Location: [facility];  Service: Urology;  Laterality: Bilateral;  . CYSTOSCOPY WITH BIOPSY N/A 11/09/2016   Procedure: CYSTOSCOPY WITH  BLADDER BIOPSY, FULGERATION;  Surgeon: Cleon Gustin, MD;  Location: [facility];  Service: Urology;  Laterality: N/A;  . ESOPHAGOGASTRODUODENOSCOPY (EGD) WITH PROPOFOL N/A 12/23/2016   Procedure: ESOPHAGOGASTRODUODENOSCOPY (EGD) WITH PROPOFOL;  Surgeon: Wilford Corner, MD;  Location: [facility];  Service: Endoscopy;  Laterality: N/A;    No family history on file.  Social history:  reports that he has never smoked. He has never used smokeless tobacco. He reports that he does not drink alcohol or use drugs.  Medications:  Prior to Admission medications   Medication Sig Start Date End Date Taking? Authorizing Provider  acetaminophen (TYLENOL) 500 MG tablet Take 500-1,000 mg by mouth every 6 (six) hours as needed for moderate pain.    Yes [provider]  allopurinol (ZYLOPRIM) 300 MG tablet Take 300 mg by mouth every morning.   Yes [provider]  clotrimazole (LOTRIMIN) 1 % cream Apply to  affected area 2 times daily Patient taking differently: Apply 1 application topically as needed (skin irritation). Apply to affected area 2 times daily 01/16/18  Yes Law, Bea Graff, PA-C  colchicine 0.6 MG tablet Take 0.6 mg by mouth 2 (two) times daily as needed (gout flare). For up to 7 days   Yes [provider]  donepezil (ARICEPT) 5 MG tablet Take 1 tablet (5 mg total) by mouth at bedtime. 04/13/17  Yes Kathrynn Ducking, MD  ENSURE PLUS (ENSURE PLUS) LIQD Take 237 mLs by mouth 2 (two) times daily between meals.   Yes [provider]  gentamicin cream (GARAMYCIN) 0.1 % Apply 1 application topically 3 (three) times daily. 09/22/17  Yes Edrick Kins, DPM  Lutein 20 MG TABS Take 1 tablet by mouth 2 (two) times daily.    Yes [provider]  memantine (NAMENDA) 5 MG tablet Take 5 mg by mouth at bedtime.   Yes [provider]  mirabegron ER (MYRBETRIQ) 25 MG TB24 tablet Take 25 mg by mouth daily.   Yes [provider]  Multiple Vitamins-Minerals (MULTIVITAMIN WITH MINERALS) tablet Take 1 tablet by mouth every morning.    Yes [provider]  ranitidine (ZANTAC) 300 MG capsule Take 300 mg by mouth at bedtime.    Yes [provider]  simvastatin (ZOCOR) 20 MG tablet Take 20 mg by mouth every evening.    Yes [provider]  tamsulosin (FLOMAX) 0.4 MG CAPS capsule Take 0.4 mg by mouth at bedtime.   Yes [provider]  triamcinolone cream (KENALOG) 0.1 % Apply 1 application topically 2 (two) times daily as needed (rash).    Yes [provider]      Allergies  Allergen Reactions  . Amoxicillin Rash    Unsure wasn't listed on [facility] 01/29/17.  Severe rash Unsure wasn't listed on [facility] 01/29/17.  Severe rash  . Nsaids Other (See Comments)    Avoid NSAIDS--  Strong family hx liver cancer Avoid NSAIDS--  Strong family hx liver cancer    ROS:  Out of a complete 14 system review of symptoms, the patient  complains only of the following symptoms, and all other reviewed systems are negative.  Hearing loss Incontinence of the bowels Incontinence of the bladder Memory loss, speech difficulty, passing out Agitation, behavior problem, confusion Skin rash  Blood pressure 100/60, pulse 72, height 5\' 8"  (1.727 m), weight 137 lb (62.1 kg).  Physical Exam  General: The patient is alert at the time of examination, he does not fully cooperate at times..  Eyes: Pupils are equal, round, and reactive to light. Discs could not be adequately evaluated, the patient does not cooperate for clinical examination.  Neck: The neck is supple, no carotid bruits are noted.  Respiratory: The  respiratory examination is clear.  Cardiovascular: The cardiovascular examination reveals a regular rate and rhythm, no obvious murmurs or rubs are noted.  Skin: Extremities are without significant edema.  Neurologic Exam  Mental status: The patient is alert, he is minimally verbal, will not follow commands consistently..  Cranial nerves: Facial symmetry is present. There is good sensation of the face to pinprick and soft touch bilaterally. The strength of the facial muscles and the muscles to head turning and shoulder shrug are normal bilaterally. Speech is slightly dysarthric, minimal speech output is noted.  On primary gaze, there is medial deviation of the right eye.  He will blink to threat bilaterally.  Motor: The motor testing is difficult, but the patient appears to have significant weakness of the right deltoid muscle, he will not cooperate to fully test the biceps, triceps, and intrinsic muscles of the hands.  Leg strength appears to be relatively symmetric and normal.  Sensory: Sensory testing is intact to pain stimulation in all 4 extremities.  Coordination: Cerebellar testing is difficult, the patient will not follow commands, no obvious dysmetria was noted with control of the arms and legs.  Gait and  station: Gait is slightly wide-based, the patient can walk independently.  Reflexes: Deep tendon reflexes are symmetric and normal bilaterally.    CT head and cervical 05/23/18:  IMPRESSION: 1. No acute intracranial abnormality. Moderate atrophy which has progressed since 01/29/2017 2. Negative for cervical spine fracture. Advanced degenerative changes throughout the cervical spine as described above.  * CT scan images were reviewed online. I agree with the written report.    Assessment/Plan:  1.  Down syndrome, mental retardation  2.  Progressive dementia, Alzheimer's disease  3.  New onset syncope versus seizure  4.  Proximal right arm weakness by clinical examination  The patient will be set up for an EEG study today.  He will be reduced on the Aricept dose taking 5 mg at night rather than 10 mg.  If the EEG is normal, we will watch the patient conservatively, if another event does occur, a seizure medication may be added.  The patient will follow-up for his next scheduled visit in January 2020.  Jill Alexanders MD 05/27/2018 10:29 AM  [facility] [address] [place], [place] [address]  Phone [phone] Fax [phone]

## 2018-05-31 DIAGNOSIS — H9193 Unspecified hearing loss, bilateral: Secondary | ICD-10-CM | POA: Diagnosis not present

## 2018-06-06 ENCOUNTER — Ambulatory Visit (INDEPENDENT_AMBULATORY_CARE_PROVIDER_SITE_OTHER): Payer: Medicare Other | Admitting: Neurology

## 2018-06-06 ENCOUNTER — Telehealth: Payer: Self-pay | Admitting: Neurology

## 2018-06-06 DIAGNOSIS — R55 Syncope and collapse: Secondary | ICD-10-CM

## 2018-06-06 NOTE — Procedures (Signed)
      History: Sean Murray is a 62 year old gentleman with a history of Down syndrome who is being evaluated for a seizure type event that occurred on 23 May 2018.  The patient had jerking of his extremities for about 4 5 minutes and was confused afterwards.  The patient appears to have a progressive dementing illness.  This is a routine EEG.  No skull defects are noted.  Medications include allopurinol, colchicine, Aricept, Namenda, Myrbetriq, multivitamins, Zocor, Zantac, Flomax, and Kenalog cream.  EEG classification: Dysrhythmia grade 1 generalized, delta grade 1 generalized  Description of the recording: The background rhythms of this recording consists of a poorly modulated medium amplitude background activity of 7 Hz that is reactive.  The patient appears to have excessive head movement and muscle artifact throughout the recording.  Photic stimulation and hyperventilation were not performed.  At times, a 3 Hz delta activity seen frontally, this comes and goes throughout the recording.  At no time does there appear to be evidence of spike or spike-wave discharges or evidence of focal slowing.  EKG monitor shows no evidence of cardiac rhythm abnormalities with a heart rate of 72.  Impression: This is an abnormal EEG recording secondary to diffuse background slowing.  The study suggests diffuse neuronal dysfunction as may be seen with a toxic or metabolic encephalopathy or any dementing type illness.  No epileptiform discharges are seen.

## 2018-06-06 NOTE — Telephone Encounter (Signed)
I called and left a message.  EEG showed generalized slowing, no epileptiform discharges.  The patient has another clinical event of loss of consciousness, they are to contact our office.

## 2018-06-14 DIAGNOSIS — H9190 Unspecified hearing loss, unspecified ear: Secondary | ICD-10-CM | POA: Diagnosis not present

## 2018-06-20 ENCOUNTER — Telehealth: Payer: Self-pay | Admitting: Neurology

## 2018-06-20 DIAGNOSIS — Z5181 Encounter for therapeutic drug level monitoring: Secondary | ICD-10-CM

## 2018-06-20 MED ORDER — PHENYTOIN SODIUM EXTENDED 100 MG PO CAPS: 300.0000 mg | ORAL_CAPSULE | Freq: Every day | ORAL | 3 refills | Status: AC

## 2018-06-20 NOTE — Telephone Encounter (Signed)
The patient had another event of jerking and altered mental status, he will be treated for presumed seizures.  He will stop the Aricept and start Dilantin taking 300 mg at night.  We will check blood work in 3 weeks.

## 2018-06-20 NOTE — Telephone Encounter (Signed)
Alexis pts casegiver (on [employer]) requesting a call back stating the pt is having increased seizure like activity. Jackie(RN on staff) would like the pt seen by Dr. Jannifer Franklin as soon as possible at [phone]. Please advise

## 2018-06-20 NOTE — Telephone Encounter (Addendum)
Dr. Jannifer Franklin- please advise. I see in last note you mentioned adding a seizure medication if he continued to have seizure activity. You had a cx 06/22/18 if you would like him to come in.

## 2018-07-04 NOTE — Telephone Encounter (Signed)
Labs are non-fasting as far as I can tell.

## 2018-07-04 NOTE — Telephone Encounter (Signed)
I spoke with Dr. Rexene Alberts. She recommends that we wait until Dr. Jannifer Franklin returns tomorrow to determine what labs need to be ordered and if pt needs to fast.  I called Alexis per DPR. She was planning on bringing pt around 9:30am tomorrow for labs. If pt is not supposed to fast, then she will have him here tomorrow at 9:30. If he does need to fast, she will bring him on Wednesday.   Ubaldo Glassing is asking for clarification on this when Dr. Jannifer Franklin returns tomorrow morning. Ubaldo Glassing can be reached at [phone].

## 2018-07-04 NOTE — Addendum Note (Signed)
Addended by: Kathrynn Ducking on: 07/04/2018 05:17 PM   Modules accepted: Orders

## 2018-07-04 NOTE — Telephone Encounter (Signed)
[email or web address] has called wanting to know if the labs pt is coming in for on tomorrow for will require him to fast.  Ubaldo Glassing has provided her cellular# as the best # to call her back on [phone]

## 2018-07-04 NOTE — Telephone Encounter (Signed)
I called Sean Murray.  The patient can come in tomorrow, blood work does not have to be fasting.  I will place orders in.

## 2018-07-05 ENCOUNTER — Other Ambulatory Visit: Payer: Self-pay

## 2018-07-11 DIAGNOSIS — S0181XA Laceration without foreign body of other part of head, initial encounter: Secondary | ICD-10-CM | POA: Diagnosis not present

## 2018-07-12 ENCOUNTER — Other Ambulatory Visit (INDEPENDENT_AMBULATORY_CARE_PROVIDER_SITE_OTHER): Payer: Medicare Other

## 2018-07-12 DIAGNOSIS — Z0289 Encounter for other administrative examinations: Secondary | ICD-10-CM

## 2018-07-12 DIAGNOSIS — Z5181 Encounter for therapeutic drug level monitoring: Secondary | ICD-10-CM

## 2018-07-13 ENCOUNTER — Other Ambulatory Visit: Payer: Self-pay | Admitting: Neurology

## 2018-07-13 ENCOUNTER — Telehealth: Payer: Self-pay | Admitting: Neurology

## 2018-07-13 LAB — COMPREHENSIVE METABOLIC PANEL
ALBUMIN: 3.8 g/dL (ref 3.6–4.8)
ALK PHOS: 138 IU/L — AB (ref 39–117)
ALT: 15 IU/L (ref 0–44)
AST: 21 IU/L (ref 0–40)
Albumin/Globulin Ratio: 1.2 (ref 1.2–2.2)
BILIRUBIN TOTAL: 0.2 mg/dL (ref 0.0–1.2)
BUN / CREAT RATIO: 18 (ref 10–24)
BUN: 22 mg/dL (ref 8–27)
CHLORIDE: 101 mmol/L (ref 96–106)
CO2: 28 mmol/L (ref 20–29)
CREATININE: 1.24 mg/dL (ref 0.76–1.27)
Calcium: 9 mg/dL (ref 8.6–10.2)
GFR calc Af Amer: 71 mL/min/{1.73_m2} (ref >59)
GFR calc non Af Amer: 61 mL/min/{1.73_m2} (ref >59)
GLOBULIN, TOTAL: 3.3 g/dL (ref 1.5–4.5)
Glucose: 72 mg/dL (ref 65–99)
POTASSIUM: 4.4 mmol/L (ref 3.5–5.2)
SODIUM: 141 mmol/L (ref 134–144)
Total Protein: 7.1 g/dL (ref 6.0–8.5)

## 2018-07-13 LAB — CBC WITH DIFFERENTIAL/PLATELET
Basophils Absolute: 0.1 10*3/uL (ref 0.0–0.2)
Basos: 1 %
EOS (ABSOLUTE): 0 10*3/uL (ref 0.0–0.4)
EOS: 1 %
HEMATOCRIT: 39.3 % (ref 37.5–51.0)
HEMOGLOBIN: 12.6 g/dL — AB (ref 13.0–17.7)
Immature Grans (Abs): 0 10*3/uL (ref 0.0–0.1)
Immature Granulocytes: 1 %
LYMPHS ABS: 2.2 10*3/uL (ref 0.7–3.1)
Lymphs: 40 %
MCH: 31.8 pg (ref 26.6–33.0)
MCHC: 32.1 g/dL (ref 31.5–35.7)
MCV: 99 fL — AB (ref 79–97)
MONOCYTES: 14 %
MONOS ABS: 0.8 10*3/uL (ref 0.1–0.9)
Neutrophils Absolute: 2.4 10*3/uL (ref 1.4–7.0)
Neutrophils: 43 %
Platelets: 230 10*3/uL (ref 150–450)
RBC: 3.96 x10E6/uL — ABNORMAL LOW (ref 4.14–5.80)
RDW: 14.5 % (ref 12.3–15.4)
WBC: 5.5 10*3/uL (ref 3.4–10.8)

## 2018-07-13 LAB — PHENYTOIN LEVEL, TOTAL: Phenytoin (Dilantin), Serum: 9.1 ug/mL — ABNORMAL LOW (ref 10.0–20.0)

## 2018-07-13 MED ORDER — PHENYTOIN 50 MG PO CHEW: 50.0000 mg | CHEWABLE_TABLET | Freq: Every day | ORAL | 1 refills | Status: AC

## 2018-07-13 MED ORDER — PHENYTOIN 50 MG PO CHEW: 50.0000 mg | CHEWABLE_TABLET | Freq: Every day | ORAL | 1 refills | Status: DC

## 2018-07-13 NOTE — Telephone Encounter (Signed)
I called Sean Murray back. Relayed per Dr. Jannifer Franklin note that he will be taking Dilantin 50mg  tab in addition to other medication. She verbalized understanding and appreciation for call.

## 2018-07-13 NOTE — Telephone Encounter (Signed)
[email or web address] [place] [place] [facility] [place] Has called to ask if the (DILANTIN) 50 MG tablet  Is to be taken in addition to what pt takes at bedtime.  Please call, Linwood Dibbles is a Merchant [employer] officer [phone]

## 2018-07-13 NOTE — Telephone Encounter (Signed)
I called the caretaker.  The Dilantin level was slightly low, and the 9.1 range.  I will add 50 mg to the current dose to a total of 350 mg daily.  I will send in a prescription.

## 2018-07-18 ENCOUNTER — Emergency Department (HOSPITAL_COMMUNITY): Payer: Medicare Other

## 2018-07-18 ENCOUNTER — Other Ambulatory Visit: Payer: Self-pay

## 2018-07-18 ENCOUNTER — Emergency Department (HOSPITAL_COMMUNITY)
Admission: EM | Admit: 2018-07-18 | Discharge: 2018-07-18 | Disposition: A | Discharge status: Home / Self Care | Payer: Medicare Other | Attending: Emergency Medicine | Admitting: Emergency Medicine

## 2018-07-18 ENCOUNTER — Encounter (HOSPITAL_COMMUNITY): Payer: Self-pay

## 2018-07-18 DIAGNOSIS — Y9389 Activity, other specified: Secondary | ICD-10-CM | POA: Diagnosis not present

## 2018-07-18 DIAGNOSIS — S0081XA Abrasion of other part of head, initial encounter: Secondary | ICD-10-CM | POA: Insufficient documentation

## 2018-07-18 DIAGNOSIS — N183 Chronic kidney disease, stage 3 (moderate): Secondary | ICD-10-CM | POA: Diagnosis not present

## 2018-07-18 DIAGNOSIS — Y998 Other external cause status: Secondary | ICD-10-CM | POA: Diagnosis not present

## 2018-07-18 DIAGNOSIS — S0990XA Unspecified injury of head, initial encounter: Secondary | ICD-10-CM

## 2018-07-18 DIAGNOSIS — Q909 Down syndrome, unspecified: Secondary | ICD-10-CM | POA: Diagnosis not present

## 2018-07-18 DIAGNOSIS — Z79899 Other long term (current) drug therapy: Secondary | ICD-10-CM | POA: Insufficient documentation

## 2018-07-18 DIAGNOSIS — Y92193 Bedroom in other specified residential institution as the place of occurrence of the external cause: Secondary | ICD-10-CM | POA: Diagnosis not present

## 2018-07-18 DIAGNOSIS — S0003XA Contusion of scalp, initial encounter: Secondary | ICD-10-CM | POA: Insufficient documentation

## 2018-07-18 DIAGNOSIS — X58XXXA Exposure to other specified factors, initial encounter: Secondary | ICD-10-CM | POA: Insufficient documentation

## 2018-07-18 MED ORDER — BACITRACIN ZINC 500 UNIT/GM EX OINT: 1.0000 "application " | TOPICAL_OINTMENT | Freq: Two times a day (BID) | CUTANEOUS | 0 refills | Status: AC

## 2018-07-18 MED ORDER — BACITRACIN ZINC 500 UNIT/GM EX OINT
TOPICAL_OINTMENT | CUTANEOUS | Status: AC
  Filled 2018-07-18: qty 1.8

## 2018-07-18 NOTE — ED Notes (Signed)
Cleansed wound applied bacitracin

## 2018-07-18 NOTE — ED Triage Notes (Signed)
Patient is a resident of [facility]/group home. Staff member reports that on their last rounds found the patient with an abrasion above the left eye, redness to the mouth. Staff not sure if the patient had LOC or not. Patient is not on blood thinners.

## 2018-07-18 NOTE — Discharge Instructions (Signed)
You were examined today for a head injury and possible concussion. Your head CT showed no evidence of  Injury today.  Keep abrasion clean and dry you may apply bacitracin ointment twice daily to help prevent any localized skin infection.  Sometimes serious problems can develop after a head injury. Please return to the emergency department if you experience any of the following symptoms: Repeated vomiting Headache that gets worse and does not go away Loss of consciousness or inability to stay awake at times when you   normally would be able to Getting more confused, restless or agitated Convulsions or seizures Difficulty walking or feeling off balance Weakness or numbness Vision changes  A concussion is a very mild traumatic brain injury caused by a bump, jolt or blow to the head, most people recover quickly and fully. You can experience a wide variety of symptoms including:   - Confusion      - Difficulty concentrating       - Trouble remembering new info  - Headache      - Dizziness        - Fuzzy or blurry vision  - Fatigue      - Balance problems      - Light sensitivity  - Mood swings     - Changes in sleep or difficulty sleeping   To help these symptoms improve make sure you are getting plenty of rest, avoid screen time, loud music and strenuous mental activities. Avoid any strenuous physical activities, once your symptoms have resolved a slow and gradual return to activity is recommended. It is very important that you avoid situations in which you might sustain a second head injury as this can be very dangerous and life threatening. You cannot be medically cleared to return to normal activities until you have followed up with your primary doctor or a concussion specialist for reevaluation.

## 2018-07-18 NOTE — ED Provider Notes (Signed)
[facility] DEPT Provider Note   CSN: [id number] Arrival date & time: 07/18/18  1442     History   Chief Complaint Chief Complaint  Patient presents with  . Fall  . Head Injury    HPI Sean Murray is a 62 y.o. male.  Sean Murray is a 62 y.o. Male With a history of Down syndrome, CKD, gout, left bundle branch block, hearing impairment and hyperlipidemia, who presents to the emergency department from his group home accompanied by staff for evaluation of head injury.  Patient was found this morning around 7 AM with an abrasion on his forehead above his left eye and some redness over his lips and they were concerned that the patient had fallen, unsure if he had any loss of consciousness or not.  Patient is not on any blood thinners.  Staff report that patient has been acting like his usual self today he has not been complaining of any pain anywhere and has been ambulatory and active as usual with no nausea or vomiting.  He denies any vision changes or dizziness and denies any headache.  Denies any numbness or weakness.     Past Medical History:  Diagnosis Date  . Abnormal EKG    HX LEFT BUNDLE BRANCH BLOCK ON 01-11-2004 EKG ON CHART, NORMAL STRESS TEST 2005  . Bladder mass    noted per ct 10/ 2017  . Cholelithiasis    one small gallstone noted per ct 10/ 2017   . CKD (chronic kidney disease), stage III (HCC)    PER PCP NOTE DR HUSAIN (last GFR 52)  --11/06/2016 CKD STAGE 3   . Down syndrome    high function  . Down's syndrome 04/13/2017  . Family history of liver cancer    both of his sister's died from liver cancer  . Foot deformity   . GERD (gastroesophageal reflux disease)   . Gout   . Gouty arthritis    last flare-up  approx. 11/ 2017  . History of Helicobacter pylori infection    followed by dr Michail Sermon [facility])  . HOH (hard of hearing)   . Hyperlipidemia   . Left bundle branch block (LBBB)    per pcp note dr Lysle Rubens LBBB  has been a known  before (11-06-2016)  . Lives in independent group home pt lived at home w/ parents until deceased then his 2 sisters were his caregivers until they both decreased -- he now lives at independent group home since 2017--   [name] is name of home (independent w/ guidance, communicates well) and is monitored medically by [facility]  . Memory disorder 04/13/2017  . OA (osteoarthritis)     Patient Active Problem List   Diagnosis Date Noted  . Down's syndrome 04/13/2017  . Memory disorder 04/13/2017  . HOH (hard of hearing) 04/13/2017  . Generalized abdominal pain 12/23/2016  . Abnormal findings-gastrointestinal tract 12/23/2016    Past Surgical History:  Procedure Laterality Date  . COLONOSCOPY  03/13/2010  . CYSTOSCOPY W/ RETROGRADES Bilateral 11/09/2016   Procedure: CYSTOSCOPY WITH RETROGRADE PYELOGRAM;  Surgeon: Cleon Gustin, MD;  Location: [facility];  Service: Urology;  Laterality: Bilateral;  . CYSTOSCOPY WITH BIOPSY N/A 11/09/2016   Procedure: CYSTOSCOPY WITH  BLADDER BIOPSY, FULGERATION;  Surgeon: Cleon Gustin, MD;  Location[facility];  Service: Urology;  Laterality: N/A;  . ESOPHAGOGASTRODUODENOSCOPY (EGD) WITH PROPOFOL N/A 12/23/2016   Procedure: ESOPHAGOGASTRODUODENOSCOPY (EGD) WITH PROPOFOL;  Surgeon: Wilford Corner, MD;  Location: [facility];  Service: Endoscopy;  Laterality: N/A;        Home Medications    Prior to Admission medications   Medication Sig Start Date End Date Taking? Authorizing Provider  acetaminophen (TYLENOL) 500 MG tablet Take 500-1,000 mg by mouth every 6 (six) hours as needed for moderate pain.     [provider]  allopurinol (ZYLOPRIM) 300 MG tablet Take 300 mg by mouth every morning.    [provider]  bacitracin ointment Apply 1 application topically 2 (two) times daily. 07/18/18   Jacqlyn Larsen, PA-C  clotrimazole (LOTRIMIN) 1 % cream Apply to affected area 2 times  daily Patient taking differently: Apply 1 application topically as needed (skin irritation). Apply to affected area 2 times daily 01/16/18   Law, Bea Graff, PA-C  colchicine 0.6 MG tablet Take 0.6 mg by mouth 2 (two) times daily as needed (gout flare). For up to 7 days    [provider]  ENSURE PLUS (ENSURE PLUS) LIQD Take 237 mLs by mouth 2 (two) times daily between meals.    [provider]  gentamicin cream (GARAMYCIN) 0.1 % Apply 1 application topically 3 (three) times daily. 09/22/17   Edrick Kins, DPM  Lutein 20 MG TABS Take 1 tablet by mouth 2 (two) times daily.     [provider]  memantine (NAMENDA) 5 MG tablet Take 5 mg by mouth at bedtime.    [provider]  mirabegron ER (MYRBETRIQ) 25 MG TB24 tablet Take 25 mg by mouth daily.    [provider]  Multiple Vitamins-Minerals (MULTIVITAMIN WITH MINERALS) tablet Take 1 tablet by mouth every morning.     [provider]  phenytoin (DILANTIN) 100 MG ER capsule Take 3 capsules (300 mg total) by mouth at bedtime. 06/20/18   Kathrynn Ducking, MD  phenytoin (DILANTIN) 50 MG tablet Chew 1 tablet (50 mg total) by mouth daily. 07/13/18   Kathrynn Ducking, MD  ranitidine (ZANTAC) 300 MG capsule Take 300 mg by mouth at bedtime.     [provider]  simvastatin (ZOCOR) 20 MG tablet Take 20 mg by mouth every evening.     [provider]  tamsulosin (FLOMAX) 0.4 MG CAPS capsule Take 0.4 mg by mouth at bedtime.    [provider]  triamcinolone cream (KENALOG) 0.1 % Apply 1 application topically 2 (two) times daily as needed (rash).     [provider]    Family History History reviewed. No pertinent family history.  Social History Social History   Tobacco Use  . Smoking status: Never Smoker  . Smokeless tobacco: Never Used  Substance Use Topics  . Alcohol use: No  . Drug use: No     Allergies   Amoxicillin and Nsaids   Review of  Systems Review of Systems  Constitutional: Negative for chills and fever.  Eyes: Negative for pain and visual disturbance.  Respiratory: Negative for shortness of breath.   Cardiovascular: Negative for chest pain.  Gastrointestinal: Negative for abdominal pain, nausea and vomiting.  Musculoskeletal: Negative for back pain and neck pain.  Skin: Positive for wound.  Neurological: Negative for dizziness, weakness, light-headedness, numbness and headaches.  Psychiatric/Behavioral: Negative for confusion.  All other systems reviewed and are negative.    Physical Exam Updated Vital Signs BP 135/72 (BP Location: Left Arm)   Pulse 75   Temp 97.9 F (36.6 C) (Oral)   Resp 18   Ht 5\' 2"  (1.575 m)  Wt 62.1 kg   SpO2 100%   BMI 25.06 kg/m   Physical Exam  Constitutional: He appears well-developed and well-nourished. No distress.  HENT:  Head: Normocephalic and atraumatic.  2 x 3 cm abrasion above the left eyebrow, no palpable underlying hematoma or bony deformity, no step-off, no pain or tenderness over the scalp or palpable deformity, no CSF rhinorrhea or otorrhea  Eyes: Pupils are equal, round, and reactive to light. EOM are normal. Right eye exhibits no discharge. Left eye exhibits no discharge.  Neck: Normal range of motion. Neck supple.  C-spine nontender to palpation with normal range of motion  Cardiovascular: Normal rate, regular rhythm, normal heart sounds and intact distal pulses.  Pulmonary/Chest: Effort normal and breath sounds normal. No respiratory distress.  Abdominal: Soft. Bowel sounds are normal. He exhibits no distension. There is no tenderness.  Musculoskeletal: He exhibits no deformity.  Neurological: He is alert. Coordination normal.  Speech is clear, able to follow commands CN III-XII intact Normal strength in upper and lower extremities bilaterally including dorsiflexion and plantar flexion, strong and equal grip strength Sensation normal to light and sharp  touch Moves extremities without ataxia, coordination intact  Skin: Skin is warm and dry. Capillary refill takes less than 2 seconds. He is not diaphoretic.  Psychiatric: He has a normal mood and affect. His behavior is normal.  Nursing note and vitals reviewed.    ED Treatments / Results  Labs (all labs ordered are listed, but only abnormal results are displayed) Labs Reviewed - No data to display  EKG None  Radiology Ct Head Wo Contrast  Result Date: 07/18/2018 CLINICAL DATA:  Facial abrasions at care facility. EXAM: CT HEAD WITHOUT CONTRAST TECHNIQUE: Contiguous axial images were obtained from the base of the skull through the vertex without intravenous contrast. COMPARISON:  CT HEAD May 23, 2018 FINDINGS: BRAIN: No intraparenchymal hemorrhage, mass effect nor midline shift. Severe parenchymal brain volume loss. No hydrocephalus. Disproportionately severe mesial temporal lobe atrophy. Patchy supratentorial white matter hypodensities within normal range for patient's age, though non-specific are most compatible with chronic small vessel ischemic disease. No acute large vascular territory infarcts. No abnormal extra-axial fluid collections. Basal cisterns are patent. VASCULAR: Moderate calcific atherosclerosis of the carotid siphons. SKULL: No skull fracture. Small LEFT frontal/periorbital scalp hematoma. SINUSES/ORBITS: Trace paranasal sinus mucosal thickening. Mastoid air cells are well aerated.The included ocular globes and orbital contents are non-suspicious. OTHER: None. IMPRESSION: 1. No acute intracranial process. Small LEFT frontal scalp hematoma. 2. Severe parenchymal brain volume loss in distribution seen with neuro degenerative syndromes. Electronically Signed   By: Elon Alas M.D.   On: 07/18/2018 19:33    Procedures Procedures (including critical care time)  Medications Ordered in ED Medications - No data to display   Initial Impression / Assessment and Plan / ED  Course  I have reviewed the triage vital signs and the nursing notes.  Pertinent labs & imaging results that were available during my care of the patient were reviewed by me and considered in my medical decision making (see chart for details).  Pt presents to the ED for evaluation after an unwitnessed fall he has a small abrasion above the left eyebrow.  He has normal neurologic exam and per facility staff that have accompanied him here he is acting at baseline no nausea vomiting.  No evidence of basilar skull fracture, no bony facial tenderness to suggest facial fracture.  Will get CT head to assess for any intracranial history given patient's  history of Down syndrome and that he cannot articulate all of his symptoms necessarily.  CT shows no evidence of an acute intracranial process there is a small left frontal scalp hematoma.  There is severe parenchymal brain volume loss seen in the distribution of neurodegenerative syndromes, this is chronic in nature.  At this time I feel patient is stable for discharge back to group home facility we will have them apply antibiotic ointment to scalp abrasion, I have provided head injury precautions, patient can follow-up with his primary doctor.  Patient and staff expressed understanding and are in agreement with this plan.  Final Clinical Impressions(s) / ED Diagnoses   Final diagnoses:  Injury of head, initial encounter  Abrasion of face, initial encounter  Hematoma of scalp, initial encounter    ED Discharge Orders         Ordered    bacitracin ointment  2 times daily     07/18/18 1840           Jacqlyn Larsen, [place] 07/18/18 1951    Charlesetta Shanks, MD 07/19/18 1722

## 2018-07-25 DIAGNOSIS — Q909 Down syndrome, unspecified: Secondary | ICD-10-CM | POA: Diagnosis not present

## 2018-07-25 DIAGNOSIS — I872 Venous insufficiency (chronic) (peripheral): Secondary | ICD-10-CM | POA: Diagnosis not present

## 2018-07-25 DIAGNOSIS — E7800 Pure hypercholesterolemia, unspecified: Secondary | ICD-10-CM | POA: Diagnosis not present

## 2018-07-25 DIAGNOSIS — Z23 Encounter for immunization: Secondary | ICD-10-CM | POA: Diagnosis not present

## 2018-07-25 DIAGNOSIS — M109 Gout, unspecified: Secondary | ICD-10-CM | POA: Diagnosis not present

## 2018-07-25 DIAGNOSIS — M21969 Unspecified acquired deformity of unspecified lower leg: Secondary | ICD-10-CM | POA: Diagnosis not present

## 2018-07-25 DIAGNOSIS — F0390 Unspecified dementia without behavioral disturbance: Secondary | ICD-10-CM | POA: Diagnosis not present

## 2018-07-25 DIAGNOSIS — N183 Chronic kidney disease, stage 3 (moderate): Secondary | ICD-10-CM | POA: Diagnosis not present

## 2018-07-25 DIAGNOSIS — K219 Gastro-esophageal reflux disease without esophagitis: Secondary | ICD-10-CM | POA: Diagnosis not present

## 2018-07-25 DIAGNOSIS — R569 Unspecified convulsions: Secondary | ICD-10-CM | POA: Diagnosis not present

## 2018-07-29 ENCOUNTER — Ambulatory Visit (INDEPENDENT_AMBULATORY_CARE_PROVIDER_SITE_OTHER): Payer: Medicare Other | Admitting: Podiatry

## 2018-07-29 ENCOUNTER — Encounter: Payer: Self-pay | Admitting: Podiatry

## 2018-07-29 DIAGNOSIS — B351 Tinea unguium: Secondary | ICD-10-CM | POA: Diagnosis not present

## 2018-07-29 DIAGNOSIS — Q828 Other specified congenital malformations of skin: Secondary | ICD-10-CM

## 2018-07-29 DIAGNOSIS — M79676 Pain in unspecified toe(s): Secondary | ICD-10-CM

## 2018-07-29 NOTE — Progress Notes (Signed)
This patient    General Appearance  Alert, conversant and in no acute stress.  Vascular  Dorsalis pedis and posterior pulses are palpable  bilaterally.  Capillary return is within normal limits  bilaterally. Temperature is within normal limits  Bilaterally.   Neurologic  Senn-Weinstein monofilament wire test absent.  bilaterally. Muscle power within normal limits bilaterally.  Nails Thick disfigured discolored nails with subungual debris bilaterally from hallux to fifth toes bilaterally. No evidence of bacterial infection or drainage bilaterally.  Orthopedic  No limitations of motion of motion feet bilaterally.  No crepitus or effusions noted.  Severe HAV  B/L.  Hammer toes 2-5 B/L.  Skin  normotropic skin with no porokeratosis noted bilaterally.  No signs of infections or ulcers noted.  Pinhole on the right ankle medial malleolus has closed.  Porokeratosis sub 4th left and sub 5th right foot.   Onychomycosis  B/L  Porokeratosis  B/L   ROV.  Debridement of onychomycotic nails 10.   Debridement of porokeratosis  B/L  . ABN signed for 2019. RTC 3 months.   Gardiner Barefoot DPM

## 2018-08-01 ENCOUNTER — Telehealth: Payer: Self-pay | Admitting: Neurology

## 2018-08-01 NOTE — Telephone Encounter (Signed)
Spoke with [employer]. She sts. she needs a written order that Aricept was d/c on 9/23. Sts. she is unable to take a verbal order. She will fax order sheet to me and I will have Dr. Jannifer Franklin sign and fax back/fim

## 2018-08-01 NOTE — Telephone Encounter (Signed)
Sean Murray with [employer] returning a call. She just needs a written order stating Aricept has been discontinued and the date when it was discontinued so she can send to pharmacy. Please fax to [phone].

## 2018-08-01 NOTE — Telephone Encounter (Signed)
Jackie @[facility] is calling to inform that our fax is ringing busy.  She is making the suggestion that Dr Jannifer Franklin on a clean sheet of paper just write the d/c of the Aricept  Sign and date it and just fax it back to them, that will suffice.  Kennyth Lose confirmed office # to be [phone] if RN Kyra Searles has questions.  Kennyth Lose also gave her cell# [phone]

## 2018-08-01 NOTE — Telephone Encounter (Signed)
LMOM for Sean Murray (identified vm) that pt's Aricept was stopped on 06/20/18.  She does not need to return this call unless she has other questions/fim

## 2018-08-01 NOTE — Telephone Encounter (Signed)
Alexis with [employer] calling to discuss if patient is still taking Aricept.

## 2018-08-02 NOTE — Telephone Encounter (Signed)
Order stating Aricept was d/c on 06/20/18 faxed to [facility]/fim

## 2018-09-20 ENCOUNTER — Emergency Department (HOSPITAL_BASED_OUTPATIENT_CLINIC_OR_DEPARTMENT_OTHER): Payer: Medicare Other

## 2018-09-20 ENCOUNTER — Other Ambulatory Visit: Payer: Self-pay

## 2018-09-20 ENCOUNTER — Encounter (HOSPITAL_BASED_OUTPATIENT_CLINIC_OR_DEPARTMENT_OTHER): Payer: Self-pay

## 2018-09-20 ENCOUNTER — Emergency Department (HOSPITAL_BASED_OUTPATIENT_CLINIC_OR_DEPARTMENT_OTHER)
Admission: EM | Admit: 2018-09-20 | Discharge: 2018-09-20 | Disposition: A | Discharge status: Home / Self Care | Payer: Medicare Other | Attending: Emergency Medicine | Admitting: Emergency Medicine

## 2018-09-20 DIAGNOSIS — S92512A Displaced fracture of proximal phalanx of left lesser toe(s), initial encounter for closed fracture: Secondary | ICD-10-CM | POA: Insufficient documentation

## 2018-09-20 DIAGNOSIS — S92515A Nondisplaced fracture of proximal phalanx of left lesser toe(s), initial encounter for closed fracture: Secondary | ICD-10-CM | POA: Diagnosis not present

## 2018-09-20 DIAGNOSIS — Z79899 Other long term (current) drug therapy: Secondary | ICD-10-CM | POA: Diagnosis not present

## 2018-09-20 DIAGNOSIS — Y939 Activity, unspecified: Secondary | ICD-10-CM | POA: Insufficient documentation

## 2018-09-20 DIAGNOSIS — Y92199 Unspecified place in other specified residential institution as the place of occurrence of the external cause: Secondary | ICD-10-CM | POA: Diagnosis not present

## 2018-09-20 DIAGNOSIS — N183 Chronic kidney disease, stage 3 (moderate): Secondary | ICD-10-CM | POA: Insufficient documentation

## 2018-09-20 DIAGNOSIS — S92502A Displaced unspecified fracture of left lesser toe(s), initial encounter for closed fracture: Secondary | ICD-10-CM

## 2018-09-20 DIAGNOSIS — Y999 Unspecified external cause status: Secondary | ICD-10-CM | POA: Insufficient documentation

## 2018-09-20 DIAGNOSIS — X58XXXA Exposure to other specified factors, initial encounter: Secondary | ICD-10-CM | POA: Insufficient documentation

## 2018-09-20 DIAGNOSIS — Q909 Down syndrome, unspecified: Secondary | ICD-10-CM | POA: Insufficient documentation

## 2018-09-20 DIAGNOSIS — S99922A Unspecified injury of left foot, initial encounter: Secondary | ICD-10-CM | POA: Diagnosis present

## 2018-09-20 NOTE — ED Provider Notes (Signed)
[facility]ENCY DEPARTMENT Provider Note   CSN: [id number] Arrival date & time: 09/20/18  8338  Level 5 caveat patient mentally retarded   History   Chief Complaint Chief Complaint  Patient presents with  . Foot Pain    Left  Patient history is obtained from worker at group home where he resides.  He has had left foot swelling first noticed this morning.  No injury.  No treatment prior to coming here.  Patient walks a slight amount but is mostly wheelchair-bound.  HPI Iren Whipp is a 62 y.o. male.  HPI  Past Medical History:  Diagnosis Date  . Abnormal EKG    HX LEFT BUNDLE BRANCH BLOCK ON 01-11-2004 EKG ON CHART, NORMAL STRESS TEST 2005  . Bladder mass    noted per ct 10/ 2017  . Cholelithiasis    one small gallstone noted per ct 10/ 2017   . CKD (chronic kidney disease), stage III (HCC)    PER PCP NOTE DR HUSAIN (last GFR 52)  --11/06/2016 CKD STAGE 3   . Down syndrome    high function  . Down's syndrome 04/13/2017  . Family history of liver cancer    both of his sister's died from liver cancer  . Foot deformity   . GERD (gastroesophageal reflux disease)   . Gout   . Gouty arthritis    last flare-up  approx. 11/ 2017  . History of Helicobacter pylori infection    followed by dr Michail Sermon [facility])  . HOH (hard of hearing)   . Hyperlipidemia   . Left bundle branch block (LBBB)    per pcp note dr Lysle Rubens LBBB  has been a known before (11-06-2016)  . Lives in independent group home pt lived at home w/ parents until deceased then his 2 sisters were his caregivers until they both decreased -- he now lives at independent group home since 2017--   [name] is name of home (independent w/ guidance, communicates well) and is monitored medically by [facility]  . Memory disorder 04/13/2017  . OA (osteoarthritis)     Patient Active Problem List   Diagnosis Date Noted  . Down's syndrome 04/13/2017  . Memory disorder 04/13/2017  . HOH (hard of  hearing) 04/13/2017  . Generalized abdominal pain 12/23/2016  . Abnormal findings-gastrointestinal tract 12/23/2016    Past Surgical History:  Procedure Laterality Date  . COLONOSCOPY  03/13/2010  . CYSTOSCOPY W/ RETROGRADES Bilateral 11/09/2016   Procedure: CYSTOSCOPY WITH RETROGRADE PYELOGRAM;  Surgeon: Cleon Gustin, MD;  Location: [facility];  Service: Urology;  Laterality: Bilateral;  . CYSTOSCOPY WITH BIOPSY N/A 11/09/2016   Procedure: CYSTOSCOPY WITH  BLADDER BIOPSY, FULGERATION;  Surgeon: Cleon Gustin, MD;  Location: [facility];  Service: Urology;  Laterality: N/A;  . ESOPHAGOGASTRODUODENOSCOPY (EGD) WITH PROPOFOL N/A 12/23/2016   Procedure: ESOPHAGOGASTRODUODENOSCOPY (EGD) WITH PROPOFOL;  Surgeon: Wilford Corner, MD;  Location: [facility];  Service: Endoscopy;  Laterality: N/A;        Home Medications    Prior to Admission medications   Medication Sig Start Date End Date Taking? Authorizing Provider  acetaminophen (TYLENOL) 500 MG tablet Take 500-1,000 mg by mouth every 6 (six) hours as needed for moderate pain.     [provider]  allopurinol (ZYLOPRIM) 300 MG tablet Take 300 mg by mouth every morning.    [provider]  bacitracin ointment Apply 1 application topically 2 (two) times daily. 07/18/18   Jacqlyn Larsen,  PA-C  clotrimazole (LOTRIMIN) 1 % cream Apply to affected area 2 times daily Patient taking differently: Apply 1 application topically as needed (skin irritation). Apply to affected area 2 times daily 01/16/18   Law, Bea Graff, PA-C  colchicine 0.6 MG tablet Take 0.6 mg by mouth 2 (two) times daily as needed (gout flare). For up to 7 days    [provider]  ENSURE PLUS (ENSURE PLUS) LIQD Take 237 mLs by mouth 2 (two) times daily between meals.    [provider]  gentamicin cream (GARAMYCIN) 0.1 % Apply 1 application topically 3 (three) times daily. 09/22/17   Edrick Kins, DPM    Lutein 20 MG TABS Take 1 tablet by mouth 2 (two) times daily.     [provider]  memantine (NAMENDA) 5 MG tablet Take 5 mg by mouth at bedtime.    [provider]  mirabegron ER (MYRBETRIQ) 25 MG TB24 tablet Take 25 mg by mouth daily.    [provider]  Multiple Vitamins-Minerals (MULTIVITAMIN WITH MINERALS) tablet Take 1 tablet by mouth every morning.     [provider]  phenytoin (DILANTIN) 100 MG ER capsule Take 3 capsules (300 mg total) by mouth at bedtime. 06/20/18   Kathrynn Ducking, MD  phenytoin (DILANTIN) 50 MG tablet Chew 1 tablet (50 mg total) by mouth daily. 07/13/18   Kathrynn Ducking, MD  ranitidine (ZANTAC) 300 MG capsule Take 300 mg by mouth at bedtime.     [provider]  simvastatin (ZOCOR) 20 MG tablet Take 20 mg by mouth every evening.     [provider]  tamsulosin (FLOMAX) 0.4 MG CAPS capsule Take 0.4 mg by mouth at bedtime.    [provider]  triamcinolone cream (KENALOG) 0.1 % Apply 1 application topically 2 (two) times daily as needed (rash).     [provider]    Family History History reviewed. No pertinent family history.  Social History Social History   Tobacco Use  . Smoking status: Never Smoker  . Smokeless tobacco: Never Used  Substance Use Topics  . Alcohol use: No  . Drug use: No     Allergies   Amoxicillin and Nsaids   Review of Systems Review of Systems  Musculoskeletal: Positive for arthralgias and gait problem.       Left foot swelling     Physical Exam Updated Vital Signs BP 104/70 (BP Location: Right Arm)   Pulse 84   Temp 98.5 F (36.9 C) (Oral)   Resp 16   Wt 57.2 kg   SpO2 99%   BMI 23.05 kg/m   Physical Exam Vitals signs and nursing note reviewed.  Constitutional:      Appearance: He is not ill-appearing.  HENT:     Head: Normocephalic and atraumatic.     Comments: Microcephalic Neck:     Musculoskeletal: Normal range of motion.   Cardiovascular:     Rate and Rhythm: Normal rate.  Pulmonary:     Effort: Pulmonary effort is normal.     Breath sounds: Normal breath sounds.  Genitourinary:    Penis: Normal.   Musculoskeletal:     Comments: Bilateral feet are grossly deformed, left foot is mildly swollen at dorsum.  Not red or hot.  DP pulse 2+.  Good capillary refill  Skin:    Capillary Refill: Capillary refill takes 2 to 3 seconds.  Neurological:     Mental Status: He is alert.  ED Treatments / Results  Labs (all labs ordered are listed, but only abnormal results are displayed) Labs Reviewed - No data to display Results for orders placed or performed in visit on 07/12/18  Phenytoin level, total  Result Value Ref Range   Phenytoin (Dilantin), Serum 9.1 (L) 10.0 - 20.0 ug/mL  CBC with Differential/Platelet  Result Value Ref Range   WBC 5.5 3.4 - 10.8 x10E3/uL   RBC 3.96 (L) 4.14 - 5.80 x10E6/uL   Hemoglobin 12.6 (L) 13.0 - 17.7 g/dL   Hematocrit 39.3 37.5 - 51.0 %   MCV 99 (H) 79 - 97 fL   MCH 31.8 26.6 - 33.0 pg   MCHC 32.1 31.5 - 35.7 g/dL   RDW 14.5 12.3 - 15.4 %   Platelets 230 150 - 450 x10E3/uL   Neutrophils 43 Not Estab. %   Lymphs 40 Not Estab. %   Monocytes 14 Not Estab. %   Eos 1 Not Estab. %   Basos 1 Not Estab. %   Neutrophils Absolute 2.4 1.4 - 7.0 x10E3/uL   Lymphocytes Absolute 2.2 0.7 - 3.1 x10E3/uL   Monocytes Absolute 0.8 0.1 - 0.9 x10E3/uL   EOS (ABSOLUTE) 0.0 0.0 - 0.4 x10E3/uL   Basophils Absolute 0.1 0.0 - 0.2 x10E3/uL   Immature Granulocytes 1 Not Estab. %   Immature Grans (Abs) 0.0 0.0 - 0.1 x10E3/uL  Comprehensive metabolic panel  Result Value Ref Range   Glucose 72 65 - 99 mg/dL   BUN 22 8 - 27 mg/dL   Creatinine, Ser 1.24 0.76 - 1.27 mg/dL   GFR calc non Af Amer 61 >59 mL/min/1.73   GFR calc Af Amer 71 >59 mL/min/1.73   BUN/Creatinine Ratio 18 10 - 24   Sodium 141 134 - 144 mmol/L   Potassium 4.4 3.5 - 5.2 mmol/L   Chloride 101 96 - 106 mmol/L   CO2 28 20  - 29 mmol/L   Calcium 9.0 8.6 - 10.2 mg/dL   Total Protein 7.1 6.0 - 8.5 g/dL   Albumin 3.8 3.6 - 4.8 g/dL   Globulin, Total 3.3 1.5 - 4.5 g/dL   Albumin/Globulin Ratio 1.2 1.2 - 2.2   Bilirubin Total 0.2 0.0 - 1.2 mg/dL   Alkaline Phosphatase 138 (H) 39 - 117 IU/L   AST 21 0 - 40 IU/L   ALT 15 0 - 44 IU/L   Dg Foot Complete Right  Result Date: 09/20/2018 CLINICAL DATA:  Redness and swelling EXAM: RIGHT FOOT COMPLETE - 3+ VIEW COMPARISON:  None. FINDINGS: Frontal, oblique, and lateral views obtained. There is an acute fracture along the medial aspect of the distal portion of the fifth proximal phalanx. No other acute fracture is evident. There is subluxation at the first, second, third, and fourth MTP joints with lateral subluxation of the proximal phalanges with respect to the metatarsals. There is moderate osteoarthritic change in the first MTP joint. There are several linear metallic foreign bodies slightly volar to the first proximal phalanx. There is pes cavus. There is generalized soft tissue swelling. There is no bony destruction or erosion. Note that there is flexion of the second, third, fourth, and fifth PIP and DIP joints. IMPRESSION: 1. Fracture of the medial aspect of the distal portion of the fifth proximal phalanx. 2. Subluxation of the first, second, third, and fourth MTP joints with lateral displacement of the phalanges with respect to the metatarsals at these levels. 3. Several linear metallic foreign bodies in the soft tissues volar to the first  proximal phalanx. 4. Diffuse soft tissue swelling without bony destruction or erosion. No soft tissue air evident. 5. Flexion deformities of the second, third, fourth, and fifth PIP and DIP joints. 6.  Extensive arthropathy first MTP joint. 7.  Pes cavus. Electronically Signed   By: Lowella Grip III M.D.   On: 09/20/2018 09:58   EKG None  Radiology No results found.  Procedures Procedures (including critical care  time)  Medications Ordered in ED Medications - No data to display   Initial Impression / Assessment and Plan / ED Course  I have reviewed the triage vital signs and the nursing notes.  Pertinent labs & imaging results that were available during my care of the patient were reviewed by me and considered in my medical decision making (see chart for details).     Reviewed by me.  Subluxations appear chronic.  Toe fracture likely acute.  Plan Tylenol.  Referral tried foot center where patient is gone in the past  Final Clinical Impressions(s) / ED Diagnoses  Diagnosis closed fracture of left fifth toe Final diagnoses:  None    ED Discharge Orders    None       Orlie Dakin, MD 09/20/18 1126

## 2018-09-20 NOTE — Discharge Instructions (Signed)
[facility] to schedule the next available appointment.  It is okay to give Sean Murray on 650 mg every 4 hours as needed for pain

## 2018-09-20 NOTE — ED Triage Notes (Signed)
Pt BIB Careg[facility] from [employer] [facility] w/ c/o left foot pain and swelling.

## 2018-09-22 ENCOUNTER — Ambulatory Visit (HOSPITAL_BASED_OUTPATIENT_CLINIC_OR_DEPARTMENT_OTHER)
Admission: RE | Admit: 2018-09-22 | Discharge: 2018-09-22 | Disposition: A | Discharge status: Home / Self Care | Payer: Medicare Other | Source: Ambulatory Visit | Attending: Emergency Medicine | Admitting: Emergency Medicine

## 2018-09-22 ENCOUNTER — Other Ambulatory Visit: Payer: Self-pay | Admitting: Emergency Medicine

## 2018-09-22 DIAGNOSIS — R609 Edema, unspecified: Secondary | ICD-10-CM

## 2018-09-22 DIAGNOSIS — S92511A Displaced fracture of proximal phalanx of right lesser toe(s), initial encounter for closed fracture: Secondary | ICD-10-CM | POA: Diagnosis not present

## 2018-09-23 ENCOUNTER — Ambulatory Visit (INDEPENDENT_AMBULATORY_CARE_PROVIDER_SITE_OTHER): Payer: Medicare Other | Admitting: Podiatry

## 2018-09-23 ENCOUNTER — Encounter: Payer: Self-pay | Admitting: Podiatry

## 2018-09-23 DIAGNOSIS — M129 Arthropathy, unspecified: Secondary | ICD-10-CM | POA: Diagnosis not present

## 2018-09-23 DIAGNOSIS — L84 Corns and callosities: Secondary | ICD-10-CM | POA: Diagnosis not present

## 2018-09-23 NOTE — Progress Notes (Signed)
This patient presents to the office for evaluation and treatment of his swollen feet.  Patient is brought to the office by a guardian from the home that he stays.  She brings with her a CD x-ray of his left foot.  She says that her left foot was swollen and inflamed on Tuesday and the reader of the x-ray read there were arthritic changes as well as a fractured fifth toe left.  The caretaker when she received this report made an appointment for an evaluation and treatment of his feet today.  The guardian for this patient says there is no pain and discomfort in his left foot and no pain and discomfort to the fifth toe left foot.  Today there is redness and increased temperature noted over the midfoot right.  Basically gets around using a wheelchair.  He presents the office today for an evaluation and treatment of his feet   General Appearance  Alert, conversant and in no acute stress.  Vascular  Dorsalis pedis and posterior pulses are palpable  bilaterally.  Capillary return is within normal limits  bilaterally. Temperature is within normal limits  Bilaterally.   Neurologic  Senn-Weinstein monofilament wire test absent.  bilaterally. Muscle power within normal limits bilaterally.  Nails Thick disfigured discolored nails with subungual debris bilaterally from hallux to fifth toes bilaterally. No evidence of bacterial infection or drainage bilaterally.  Orthopedic  No limitations of motion of motion feet bilaterally.  No crepitus or effusions noted.  Severe HAV  B/L.  Hammer toes 2-5 B/L.  Midfoot arthritis  B/L.  Skin  normotropic skin with no porokeratosis noted bilaterally.  No signs of infections or ulcers noted.  Diffuse plantar tyloma  B/L with left worse than right foot.   Chronic Arthritis Midfoot  B/L.   ROV x-rays were reviewed and they state no a severe dislocation of the first MPJ leading to a bunion formation left foot.  There is also dislocation of the second third and fourth digits  leading to plantarflexion of the second third and fourth metatarsal heads leading to clinical callus.  Palpable pain noted to the midfoot of the right and the presence of an exostosis midfoot bilaterally.  Discussed this condition with this patient and guardian.  Guardian states that anti-inflammatory medication is not a good alternative for this patient.  Therefore we discussed applying topical anesthetic to the top of both of his feet when he experiences pain and discomfort.  We will order this medication through [facility] and the point of contact will be Guadalupe Dawn.  Patient is told to make an appointment for preventative foot care services as he leaves.   Gardiner Barefoot DPM

## 2018-09-27 DIAGNOSIS — F0390 Unspecified dementia without behavioral disturbance: Secondary | ICD-10-CM | POA: Diagnosis not present

## 2018-09-27 DIAGNOSIS — E46 Unspecified protein-calorie malnutrition: Secondary | ICD-10-CM | POA: Diagnosis not present

## 2018-09-27 DIAGNOSIS — R600 Localized edema: Secondary | ICD-10-CM | POA: Diagnosis not present

## 2018-10-03 ENCOUNTER — Telehealth: Payer: Self-pay | Admitting: Podiatry

## 2018-10-03 NOTE — Telephone Encounter (Signed)
Please fax notes to Guadalupe Dawn [phone]

## 2018-10-05 ENCOUNTER — Inpatient Hospital Stay (HOSPITAL_COMMUNITY)
Admission: EM | Admit: 2018-10-05 | Discharge: 2018-10-29 | DRG: 682 | Disposition: E | Payer: Medicare Other | Attending: Family Medicine | Admitting: Family Medicine

## 2018-10-05 ENCOUNTER — Emergency Department (HOSPITAL_COMMUNITY): Payer: Medicare Other

## 2018-10-05 ENCOUNTER — Encounter (HOSPITAL_COMMUNITY): Payer: Self-pay | Admitting: *Deleted

## 2018-10-05 ENCOUNTER — Other Ambulatory Visit: Payer: Self-pay

## 2018-10-05 DIAGNOSIS — H547 Unspecified visual loss: Secondary | ICD-10-CM | POA: Diagnosis present

## 2018-10-05 DIAGNOSIS — E46 Unspecified protein-calorie malnutrition: Secondary | ICD-10-CM | POA: Diagnosis not present

## 2018-10-05 DIAGNOSIS — G92 Toxic encephalopathy: Secondary | ICD-10-CM | POA: Diagnosis not present

## 2018-10-05 DIAGNOSIS — E871 Hypo-osmolality and hyponatremia: Secondary | ICD-10-CM | POA: Diagnosis not present

## 2018-10-05 DIAGNOSIS — E785 Hyperlipidemia, unspecified: Secondary | ICD-10-CM | POA: Diagnosis present

## 2018-10-05 DIAGNOSIS — Z66 Do not resuscitate: Secondary | ICD-10-CM | POA: Diagnosis present

## 2018-10-05 DIAGNOSIS — M109 Gout, unspecified: Secondary | ICD-10-CM | POA: Diagnosis present

## 2018-10-05 DIAGNOSIS — Z6820 Body mass index (BMI) 20.0-20.9, adult: Secondary | ICD-10-CM

## 2018-10-05 DIAGNOSIS — Q909 Down syndrome, unspecified: Secondary | ICD-10-CM | POA: Diagnosis not present

## 2018-10-05 DIAGNOSIS — K121 Other forms of stomatitis: Secondary | ICD-10-CM | POA: Diagnosis present

## 2018-10-05 DIAGNOSIS — R509 Fever, unspecified: Secondary | ICD-10-CM

## 2018-10-05 DIAGNOSIS — M6282 Rhabdomyolysis: Secondary | ICD-10-CM | POA: Diagnosis present

## 2018-10-05 DIAGNOSIS — I130 Hypertensive heart and chronic kidney disease with heart failure and stage 1 through stage 4 chronic kidney disease, or unspecified chronic kidney disease: Secondary | ICD-10-CM | POA: Diagnosis present

## 2018-10-05 DIAGNOSIS — G9389 Other specified disorders of brain: Secondary | ICD-10-CM | POA: Diagnosis not present

## 2018-10-05 DIAGNOSIS — D631 Anemia in chronic kidney disease: Secondary | ICD-10-CM | POA: Diagnosis present

## 2018-10-05 DIAGNOSIS — E860 Dehydration: Secondary | ICD-10-CM | POA: Diagnosis not present

## 2018-10-05 DIAGNOSIS — H9190 Unspecified hearing loss, unspecified ear: Secondary | ICD-10-CM | POA: Diagnosis present

## 2018-10-05 DIAGNOSIS — M1990 Unspecified osteoarthritis, unspecified site: Secondary | ICD-10-CM | POA: Diagnosis present

## 2018-10-05 DIAGNOSIS — R9431 Abnormal electrocardiogram [ECG] [EKG]: Secondary | ICD-10-CM | POA: Diagnosis not present

## 2018-10-05 DIAGNOSIS — R000 Tachycardia, unspecified: Secondary | ICD-10-CM | POA: Diagnosis not present

## 2018-10-05 DIAGNOSIS — G40909 Epilepsy, unspecified, not intractable, without status epilepticus: Secondary | ICD-10-CM | POA: Diagnosis present

## 2018-10-05 DIAGNOSIS — I82611 Acute embolism and thrombosis of superficial veins of right upper extremity: Secondary | ICD-10-CM | POA: Diagnosis present

## 2018-10-05 DIAGNOSIS — F0280 Dementia in other diseases classified elsewhere without behavioral disturbance: Secondary | ICD-10-CM | POA: Diagnosis not present

## 2018-10-05 DIAGNOSIS — K8020 Calculus of gallbladder without cholecystitis without obstruction: Secondary | ICD-10-CM | POA: Diagnosis not present

## 2018-10-05 DIAGNOSIS — R0902 Hypoxemia: Secondary | ICD-10-CM | POA: Diagnosis not present

## 2018-10-05 DIAGNOSIS — I4510 Unspecified right bundle-branch block: Secondary | ICD-10-CM | POA: Diagnosis present

## 2018-10-05 DIAGNOSIS — J189 Pneumonia, unspecified organism: Secondary | ICD-10-CM | POA: Diagnosis not present

## 2018-10-05 DIAGNOSIS — E870 Hyperosmolality and hypernatremia: Secondary | ICD-10-CM | POA: Diagnosis not present

## 2018-10-05 DIAGNOSIS — D6832 Hemorrhagic disorder due to extrinsic circulating anticoagulants: Secondary | ICD-10-CM | POA: Diagnosis not present

## 2018-10-05 DIAGNOSIS — R630 Anorexia: Secondary | ICD-10-CM | POA: Diagnosis not present

## 2018-10-05 DIAGNOSIS — L03113 Cellulitis of right upper limb: Secondary | ICD-10-CM | POA: Diagnosis not present

## 2018-10-05 DIAGNOSIS — R109 Unspecified abdominal pain: Secondary | ICD-10-CM

## 2018-10-05 DIAGNOSIS — E43 Unspecified severe protein-calorie malnutrition: Secondary | ICD-10-CM | POA: Diagnosis present

## 2018-10-05 DIAGNOSIS — N179 Acute kidney failure, unspecified: Secondary | ICD-10-CM | POA: Diagnosis present

## 2018-10-05 DIAGNOSIS — Z7401 Bed confinement status: Secondary | ICD-10-CM

## 2018-10-05 DIAGNOSIS — E876 Hypokalemia: Secondary | ICD-10-CM | POA: Diagnosis not present

## 2018-10-05 DIAGNOSIS — R404 Transient alteration of awareness: Secondary | ICD-10-CM | POA: Diagnosis not present

## 2018-10-05 DIAGNOSIS — K219 Gastro-esophageal reflux disease without esophagitis: Secondary | ICD-10-CM | POA: Diagnosis present

## 2018-10-05 DIAGNOSIS — I82432 Acute embolism and thrombosis of left popliteal vein: Secondary | ICD-10-CM | POA: Diagnosis not present

## 2018-10-05 DIAGNOSIS — I82412 Acute embolism and thrombosis of left femoral vein: Secondary | ICD-10-CM | POA: Diagnosis not present

## 2018-10-05 DIAGNOSIS — K5900 Constipation, unspecified: Secondary | ICD-10-CM | POA: Diagnosis present

## 2018-10-05 DIAGNOSIS — F419 Anxiety disorder, unspecified: Secondary | ICD-10-CM | POA: Diagnosis not present

## 2018-10-05 DIAGNOSIS — R339 Retention of urine, unspecified: Secondary | ICD-10-CM | POA: Diagnosis not present

## 2018-10-05 DIAGNOSIS — I5022 Chronic systolic (congestive) heart failure: Secondary | ICD-10-CM | POA: Diagnosis present

## 2018-10-05 DIAGNOSIS — Z515 Encounter for palliative care: Secondary | ICD-10-CM | POA: Diagnosis not present

## 2018-10-05 DIAGNOSIS — G9341 Metabolic encephalopathy: Secondary | ICD-10-CM | POA: Diagnosis not present

## 2018-10-05 DIAGNOSIS — Z800 Family history of malignant neoplasm of digestive organs: Secondary | ICD-10-CM | POA: Diagnosis not present

## 2018-10-05 DIAGNOSIS — I959 Hypotension, unspecified: Secondary | ICD-10-CM | POA: Diagnosis not present

## 2018-10-05 DIAGNOSIS — N183 Chronic kidney disease, stage 3 (moderate): Secondary | ICD-10-CM | POA: Diagnosis present

## 2018-10-05 DIAGNOSIS — Z886 Allergy status to analgesic agent status: Secondary | ICD-10-CM

## 2018-10-05 DIAGNOSIS — R040 Epistaxis: Secondary | ICD-10-CM | POA: Diagnosis not present

## 2018-10-05 DIAGNOSIS — M7989 Other specified soft tissue disorders: Secondary | ICD-10-CM | POA: Diagnosis not present

## 2018-10-05 DIAGNOSIS — L89890 Pressure ulcer of other site, unstageable: Secondary | ICD-10-CM | POA: Diagnosis present

## 2018-10-05 DIAGNOSIS — F79 Unspecified intellectual disabilities: Secondary | ICD-10-CM | POA: Diagnosis present

## 2018-10-05 DIAGNOSIS — Z79899 Other long term (current) drug therapy: Secondary | ICD-10-CM

## 2018-10-05 DIAGNOSIS — J690 Pneumonitis due to inhalation of food and vomit: Secondary | ICD-10-CM | POA: Diagnosis not present

## 2018-10-05 DIAGNOSIS — I2699 Other pulmonary embolism without acute cor pulmonale: Secondary | ICD-10-CM | POA: Diagnosis not present

## 2018-10-05 DIAGNOSIS — Z881 Allergy status to other antibiotic agents status: Secondary | ICD-10-CM

## 2018-10-05 DIAGNOSIS — Z993 Dependence on wheelchair: Secondary | ICD-10-CM

## 2018-10-05 DIAGNOSIS — L8990 Pressure ulcer of unspecified site, unspecified stage: Secondary | ICD-10-CM

## 2018-10-05 DIAGNOSIS — R600 Localized edema: Secondary | ICD-10-CM | POA: Diagnosis not present

## 2018-10-05 DIAGNOSIS — R627 Adult failure to thrive: Secondary | ICD-10-CM | POA: Diagnosis present

## 2018-10-05 DIAGNOSIS — N170 Acute kidney failure with tubular necrosis: Secondary | ICD-10-CM | POA: Diagnosis not present

## 2018-10-05 DIAGNOSIS — R918 Other nonspecific abnormal finding of lung field: Secondary | ICD-10-CM | POA: Diagnosis not present

## 2018-10-05 DIAGNOSIS — I447 Left bundle-branch block, unspecified: Secondary | ICD-10-CM | POA: Diagnosis present

## 2018-10-05 DIAGNOSIS — I9589 Other hypotension: Secondary | ICD-10-CM | POA: Diagnosis present

## 2018-10-05 LAB — CBC WITH DIFFERENTIAL/PLATELET
Abs Immature Granulocytes: 0.15 10*3/uL — ABNORMAL HIGH (ref 0.00–0.07)
Basophils Absolute: 0.1 10*3/uL (ref 0.0–0.1)
Basophils Relative: 1 %
EOS ABS: 0 10*3/uL (ref 0.0–0.5)
EOS PCT: 0 %
HEMATOCRIT: 36.1 % — AB (ref 39.0–52.0)
Hemoglobin: 10.9 g/dL — ABNORMAL LOW (ref 13.0–17.0)
Immature Granulocytes: 1 %
LYMPHS ABS: 1.9 10*3/uL (ref 0.7–4.0)
Lymphocytes Relative: 14 %
MCH: 32.9 pg (ref 26.0–34.0)
MCHC: 30.2 g/dL (ref 30.0–36.0)
MCV: 109.1 fL — AB (ref 80.0–100.0)
MONO ABS: 1 10*3/uL (ref 0.1–1.0)
MONOS PCT: 8 %
NRBC: 0 % (ref 0.0–0.2)
Neutro Abs: 10 10*3/uL — ABNORMAL HIGH (ref 1.7–7.7)
Neutrophils Relative %: 76 %
Platelets: 279 10*3/uL (ref 150–400)
RBC: 3.31 MIL/uL — ABNORMAL LOW (ref 4.22–5.81)
RDW: 14.7 % (ref 11.5–15.5)
WBC: 13.2 10*3/uL — ABNORMAL HIGH (ref 4.0–10.5)

## 2018-10-05 LAB — MRSA PCR SCREENING: MRSA by PCR: NEGATIVE

## 2018-10-05 LAB — COMPREHENSIVE METABOLIC PANEL
ALBUMIN: 2.6 g/dL — AB (ref 3.5–5.0)
ALK PHOS: 101 U/L (ref 38–126)
ALT: 54 U/L — AB (ref 0–44)
ANION GAP: 11 (ref 5–15)
AST: 78 U/L — AB (ref 15–41)
BILIRUBIN TOTAL: 0.4 mg/dL (ref 0.3–1.2)
BUN: 66 mg/dL — AB (ref 8–23)
CALCIUM: 8.5 mg/dL — AB (ref 8.9–10.3)
CO2: 26 mmol/L (ref 22–32)
Chloride: 114 mmol/L — ABNORMAL HIGH (ref 98–111)
Creatinine, Ser: 2.04 mg/dL — ABNORMAL HIGH (ref 0.61–1.24)
GFR calc Af Amer: 39 mL/min — ABNORMAL LOW (ref >60)
GFR calc non Af Amer: 33 mL/min — ABNORMAL LOW (ref >60)
Glucose, Bld: 130 mg/dL — ABNORMAL HIGH (ref 70–99)
Potassium: 4.2 mmol/L (ref 3.5–5.1)
Sodium: 151 mmol/L — ABNORMAL HIGH (ref 135–145)
TOTAL PROTEIN: 7.1 g/dL (ref 6.5–8.1)

## 2018-10-05 LAB — FERRITIN: FERRITIN: 1686 ng/mL — AB (ref 24–336)

## 2018-10-05 LAB — PHOSPHORUS: Phosphorus: 5 mg/dL — ABNORMAL HIGH (ref 2.5–4.6)

## 2018-10-05 LAB — BASIC METABOLIC PANEL
Anion gap: 8 (ref 5–15)
BUN: 52 mg/dL — ABNORMAL HIGH (ref 8–23)
CO2: 20 mmol/L — ABNORMAL LOW (ref 22–32)
Calcium: 7.6 mg/dL — ABNORMAL LOW (ref 8.9–10.3)
Chloride: 121 mmol/L — ABNORMAL HIGH (ref 98–111)
Creatinine, Ser: 1.57 mg/dL — ABNORMAL HIGH (ref 0.61–1.24)
GFR calc non Af Amer: 46 mL/min — ABNORMAL LOW (ref >60)
GFR, EST AFRICAN AMERICAN: 53 mL/min — AB (ref >60)
Glucose, Bld: 124 mg/dL — ABNORMAL HIGH (ref 70–99)
Potassium: 4.1 mmol/L (ref 3.5–5.1)
Sodium: 149 mmol/L — ABNORMAL HIGH (ref 135–145)

## 2018-10-05 LAB — TSH: TSH: 4.681 u[IU]/mL — ABNORMAL HIGH (ref 0.350–4.500)

## 2018-10-05 LAB — POC OCCULT BLOOD, ED: FECAL OCCULT BLD: NEGATIVE

## 2018-10-05 LAB — I-STAT CG4 LACTIC ACID, ED
Lactic Acid, Venous: 2.23 mmol/L (ref 0.5–1.9)
Lactic Acid, Venous: 0.93 mmol/L (ref 0.5–1.9)

## 2018-10-05 LAB — RETICULOCYTES
Immature Retic Fract: 18.2 % — ABNORMAL HIGH (ref 2.3–15.9)
RBC.: 3.39 MIL/uL — ABNORMAL LOW (ref 4.22–5.81)
RETIC CT PCT: 1.7 % (ref 0.4–3.1)
Retic Count, Absolute: 56.3 10*3/uL (ref 19.0–186.0)

## 2018-10-05 LAB — IRON AND TIBC
Iron: 21 ug/dL — ABNORMAL LOW (ref 45–182)
Saturation Ratios: 17 % — ABNORMAL LOW (ref 17.9–39.5)
TIBC: 124 ug/dL — ABNORMAL LOW (ref 250–450)
UIBC: 103 ug/dL

## 2018-10-05 LAB — URINALYSIS, ROUTINE W REFLEX MICROSCOPIC
Bilirubin Urine: NEGATIVE
GLUCOSE, UA: NEGATIVE mg/dL
Hgb urine dipstick: NEGATIVE
KETONES UR: NEGATIVE mg/dL
LEUKOCYTES UA: NEGATIVE
NITRITE: NEGATIVE
PROTEIN: NEGATIVE mg/dL
Specific Gravity, Urine: 1.023 (ref 1.005–1.030)
pH: 5 (ref 5.0–8.0)

## 2018-10-05 LAB — I-STAT TROPONIN, ED: Troponin i, poc: 0.01 ng/mL (ref 0.00–0.08)

## 2018-10-05 LAB — BRAIN NATRIURETIC PEPTIDE: B Natriuretic Peptide: 51 pg/mL (ref 0.0–100.0)

## 2018-10-05 LAB — TROPONIN I: Troponin I: <0.03 ng/mL (ref <0.03)

## 2018-10-05 LAB — AMMONIA: Ammonia: 16 umol/L (ref 9–35)

## 2018-10-05 LAB — VITAMIN B12: Vitamin B-12: 799 pg/mL (ref 180–914)

## 2018-10-05 LAB — MAGNESIUM: Magnesium: 2.5 mg/dL — ABNORMAL HIGH (ref 1.7–2.4)

## 2018-10-05 LAB — FOLATE: Folate: 26 ng/mL (ref >5.9)

## 2018-10-05 LAB — CBG MONITORING, ED: Glucose-Capillary: 125 mg/dL — ABNORMAL HIGH (ref 70–99)

## 2018-10-05 LAB — OSMOLALITY: Osmolality: 329 mOsm/kg (ref 275–295)

## 2018-10-05 LAB — CK: Total CK: 898 U/L — ABNORMAL HIGH (ref 49–397)

## 2018-10-05 LAB — PROTIME-INR
INR: 1.43
Prothrombin Time: 17.2 seconds — ABNORMAL HIGH (ref 11.4–15.2)

## 2018-10-05 LAB — PHENYTOIN LEVEL, TOTAL: Phenytoin Lvl: 3.2 ug/mL — ABNORMAL LOW (ref 10.0–20.0)

## 2018-10-05 MED ORDER — BISACODYL 10 MG RE SUPP: 10.0000 mg | Freq: Every day | RECTAL | Status: DC | PRN

## 2018-10-05 MED ORDER — SIMVASTATIN 10 MG PO TABS
20.0000 mg | ORAL_TABLET | Freq: Every evening | ORAL | Status: DC
  Administered 2018-10-07: 20 mg via ORAL
  Filled 2018-10-05 (×2): qty 2

## 2018-10-05 MED ORDER — THIAMINE HCL 100 MG/ML IJ SOLN
100.0000 mg | Freq: Every day | INTRAMUSCULAR | Status: DC
  Administered 2018-10-05 – 2018-10-14 (×10): 100 mg via INTRAVENOUS
  Filled 2018-10-05 (×10): qty 2

## 2018-10-05 MED ORDER — ORAL CARE MOUTH RINSE
15.0000 mL | Freq: Two times a day (BID) | OROMUCOSAL | Status: DC
  Administered 2018-10-06 – 2018-10-17 (×18): 15 mL via OROMUCOSAL

## 2018-10-05 MED ORDER — ACETAMINOPHEN 650 MG RE SUPP
650.0000 mg | Freq: Four times a day (QID) | RECTAL | Status: DC | PRN
  Administered 2018-10-08: 650 mg via RECTAL
  Filled 2018-10-05 (×2): qty 1

## 2018-10-05 MED ORDER — HYDROCODONE-ACETAMINOPHEN 5-325 MG PO TABS
1.0000 | ORAL_TABLET | ORAL | Status: DC | PRN
  Administered 2018-10-09 – 2018-10-12 (×3): 1 via ORAL
  Administered 2018-10-12: 2 via ORAL
  Administered 2018-10-12: 1 via ORAL
  Filled 2018-10-05: qty 2
  Filled 2018-10-05 (×4): qty 1

## 2018-10-05 MED ORDER — ACETAMINOPHEN 325 MG PO TABS
650.0000 mg | ORAL_TABLET | Freq: Four times a day (QID) | ORAL | Status: DC | PRN
  Administered 2018-10-07 – 2018-10-13 (×9): 650 mg via ORAL
  Filled 2018-10-05 (×9): qty 2

## 2018-10-05 MED ORDER — LACTATED RINGERS IV BOLUS
1000.0000 mL | Freq: Once | INTRAVENOUS | Status: AC
  Administered 2018-10-05: 1000 mL via INTRAVENOUS

## 2018-10-05 MED ORDER — DEXTROSE-NACL 5-0.45 % IV SOLN
INTRAVENOUS | Status: DC
  Administered 2018-10-05: 21:00:00 via INTRAVENOUS

## 2018-10-05 MED ORDER — MEMANTINE HCL 5 MG PO TABS: 5.0000 mg | ORAL_TABLET | Freq: Every day | ORAL | Status: DC

## 2018-10-05 MED ORDER — THIAMINE HCL 100 MG/ML IJ SOLN
100.0000 mg | Freq: Once | INTRAMUSCULAR | Status: AC
  Administered 2018-10-05: 100 mg via INTRAVENOUS
  Filled 2018-10-05: qty 2

## 2018-10-05 MED ORDER — TAMSULOSIN HCL 0.4 MG PO CAPS: 0.4000 mg | ORAL_CAPSULE | Freq: Every day | ORAL | Status: DC

## 2018-10-05 MED ORDER — FOLIC ACID 5 MG/ML IJ SOLN
1.0000 mg | Freq: Every day | INTRAMUSCULAR | Status: DC
  Administered 2018-10-05 – 2018-10-14 (×10): 1 mg via INTRAVENOUS
  Filled 2018-10-05 (×10): qty 0.2

## 2018-10-05 MED ORDER — SODIUM CHLORIDE 0.9 % IV BOLUS (SEPSIS)
1000.0000 mL | Freq: Once | INTRAVENOUS | Status: AC
  Administered 2018-10-05: 1000 mL via INTRAVENOUS

## 2018-10-05 MED ORDER — ALLOPURINOL 300 MG PO TABS
300.0000 mg | ORAL_TABLET | Freq: Every morning | ORAL | Status: DC
  Administered 2018-10-07 – 2018-10-13 (×7): 300 mg via ORAL
  Filled 2018-10-05 (×4): qty 3
  Filled 2018-10-05: qty 1
  Filled 2018-10-05: qty 3
  Filled 2018-10-05: qty 1

## 2018-10-05 MED ORDER — SODIUM CHLORIDE 0.9 % IV BOLUS
500.0000 mL | Freq: Once | INTRAVENOUS | Status: AC
  Administered 2018-10-05: 500 mL via INTRAVENOUS

## 2018-10-05 MED ORDER — SODIUM CHLORIDE 0.9 % IV BOLUS (SEPSIS)
500.0000 mL | Freq: Once | INTRAVENOUS | Status: AC
  Administered 2018-10-05: 500 mL via INTRAVENOUS

## 2018-10-05 MED ORDER — PHENYTOIN SODIUM 50 MG/ML IJ SOLN
100.0000 mg | Freq: Three times a day (TID) | INTRAMUSCULAR | Status: DC
  Administered 2018-10-05 – 2018-10-18 (×37): 100 mg via INTRAVENOUS
  Filled 2018-10-05 (×40): qty 2

## 2018-10-05 MED ORDER — SENNA 8.6 MG PO TABS
1.0000 | ORAL_TABLET | Freq: Two times a day (BID) | ORAL | Status: DC
  Administered 2018-10-07 – 2018-10-13 (×12): 8.6 mg via ORAL
  Filled 2018-10-05 (×12): qty 1

## 2018-10-05 MED ORDER — SODIUM CHLORIDE 0.9 % IV BOLUS (SEPSIS)
250.0000 mL | Freq: Once | INTRAVENOUS | Status: AC
  Administered 2018-10-05: 250 mL via INTRAVENOUS

## 2018-10-05 NOTE — ED Notes (Signed)
Pt want be still enough to obtain vitals

## 2018-10-05 NOTE — ED Notes (Signed)
ED TO INPATIENT HANDOFF REPORT  Name/Age/Gender Sean Murray 63 y.o. male  Code Status   Home/SNF/Other Given to floor  Chief Complaint FTT  Level of Care/Admitting Diagnosis ED Disposition    ED Disposition Condition [place]: [place] [[id number]]  Level of Care: Stepdown [14]  Admit to SDU based on following criteria: Hemodynamic compromise or significant risk of instability:  Patient requiring short term acute titration and management of vasoactive drips, and invasive monitoring (i.e., CVP and Arterial line).  Diagnosis: Acute renal failure (ARF) ([place]) [616073]  Admitting Physician: Toy Baker [[id number]]  Attending Physician: Toy Baker [[id number]]  Estimated length of stay: 3 - 4 days  Certification:: I certify this patient will need inpatient services for at least 2 midnights  PT Class (Do Not Modify): Inpatient [101]  PT Acc Code (Do Not Modify): Private [1]       Medical History Past Medical History:  Diagnosis Date  . Abnormal EKG    HX LEFT BUNDLE BRANCH BLOCK ON 01-11-2004 EKG ON CHART, NORMAL STRESS TEST 2005  . Bladder mass    noted per ct 10/ 2017  . Cholelithiasis    one small gallstone noted per ct 10/ 2017   . CKD (chronic kidney disease), stage III (HCC)    PER PCP NOTE DR HUSAIN (last GFR 52)  --11/06/2016 CKD STAGE 3   . Down syndrome    high function  . Down's syndrome 04/13/2017  . Family history of liver cancer    both of his sister's died from liver cancer  . Foot deformity   . GERD (gastroesophageal reflux disease)   . Gout   . Gouty arthritis    last flare-up  approx. 11/ 2017  . History of Helicobacter pylori infection    followed by dr Michail Sermon [facility])  . HOH (hard of hearing)   . Hyperlipidemia   . Left bundle branch block (LBBB)    per pcp note dr Lysle Rubens LBBB  has been a known before (11-06-2016)  . Lives in independent group home pt lived at home w/ parents until deceased then his 2  sisters were his caregivers until they both decreased -- he now lives at independent group home since 2017--   [name] is name of home (independent w/ guidance, communicates well) and is monitored medically by [facility]  . Memory disorder 04/13/2017  . OA (osteoarthritis)     Allergies Allergies  Allergen Reactions  . Amoxicillin Rash    Unsure wasn't listed on [facility] 01/29/17.  Severe rash Unsure wasn't listed on [facility] 01/29/17.  Severe rash  . Nsaids Other (See Comments)    Avoid NSAIDS--  Strong family hx liver cancer Avoid NSAIDS--  Strong family hx liver cancer    IV Location/Drains/Wounds Patient Lines/Drains/Airways Status   Active Line/Drains/Airways    Name:   Placement date:   Placement time:   Site:   Days:   Peripheral IV 10/28/2018 Left Antecubital   10/06/2018    1716    Antecubital   less than 1   Incision (Closed) 11/09/16 Penis Other (Comment)   11/09/16    1411     695          Labs/Imaging Results for orders placed or performed during the hospital encounter of 10/25/2018 (from the past 48 hour(s))  Urinalysis, Routine w reflex microscopic     Status: None   Collection Time: 10/26/2018  3:31 PM  Result Value Ref Range  Color, Urine YELLOW YELLOW   APPearance CLEAR CLEAR   Specific Gravity, Urine 1.023 1.005 - 1.030   pH 5.0 5.0 - 8.0   Glucose, UA NEGATIVE NEGATIVE mg/dL   Hgb urine dipstick NEGATIVE NEGATIVE   Bilirubin Urine NEGATIVE NEGATIVE   Ketones, ur NEGATIVE NEGATIVE mg/dL   Protein, ur NEGATIVE NEGATIVE mg/dL   Nitrite NEGATIVE NEGATIVE   Leukocytes, UA NEGATIVE NEGATIVE    Comment: Performed at [facility], [place] [address], [place], [place] [address]  POC occult blood, ED Provider will collect     Status: None   Collection Time: 10/22/2018  3:32 PM  Result Value Ref Range   Fecal Occult Bld NEGATIVE NEGATIVE  I-Stat Troponin, ED (not at [facility])     Status: None   Collection Time: 10/17/2018  4:11 PM  Result Value Ref Range    Troponin i, poc 0.01 0.00 - 0.08 ng/mL   Comment 3            Comment: Due to the release kinetics of cTnI, a negative result within the first hours of the onset of symptoms does not rule out myocardial infarction with certainty. If myocardial infarction is still suspected, repeat the test at appropriate intervals.   I-Stat CG4 Lactic Acid, ED     Status: Abnormal   Collection Time: 10/20/2018  4:13 PM  Result Value Ref Range   Lactic Acid, Venous 2.23 (HH) 0.5 - 1.9 mmol/L   Comment NOTIFIED PHYSICIAN   CBG monitoring, ED     Status: Abnormal   Collection Time: 10/04/2018  4:45 PM  Result Value Ref Range   Glucose-Capillary 125 (H) 70 - 99 mg/dL  Comprehensive metabolic panel     Status: Abnormal   Collection Time: 10/12/2018  4:51 PM  Result Value Ref Range   Sodium 151 (H) 135 - 145 mmol/L   Potassium 4.2 3.5 - 5.1 mmol/L   Chloride 114 (H) 98 - 111 mmol/L   CO2 26 22 - 32 mmol/L   Glucose, Bld 130 (H) 70 - 99 mg/dL   BUN 66 (H) 8 - 23 mg/dL   Creatinine, Ser 2.04 (H) 0.61 - 1.24 mg/dL   Calcium 8.5 (L) 8.9 - 10.3 mg/dL   Total Protein 7.1 6.5 - 8.1 g/dL   Albumin 2.6 (L) 3.5 - 5.0 g/dL   AST 78 (H) 15 - 41 U/L   ALT 54 (H) 0 - 44 U/L   Alkaline Phosphatase 101 38 - 126 U/L   Total Bilirubin 0.4 0.3 - 1.2 mg/dL   GFR calc non Af Amer 33 (L) >60 mL/min   GFR calc Af Amer 39 (L) >60 mL/min   Anion gap 11 5 - 15    Comment: Performed at [facility], [place] [address], [place], [place] [address]  Brain natriuretic peptide     Status: None   Collection Time: 10/24/2018  4:51 PM  Result Value Ref Range   B Natriuretic Peptide 51.0 0.0 - 100.0 pg/mL    Comment: Performed at [facility], [place] [address], [place], [place] [address]  CBC with Differential     Status: Abnormal   Collection Time: 10/12/2018  4:51 PM  Result Value Ref Range   WBC 13.2 (H) 4.0 - 10.5 K/uL   RBC 3.31 (L) 4.22 - 5.81 MIL/uL   Hemoglobin 10.9 (L) 13.0 - 17.0 g/dL   HCT  36.1 (L) 39.0 - 52.0 %   MCV 109.1 (H) 80.0 -  100.0 fL   MCH 32.9 26.0 - 34.0 pg   MCHC 30.2 30.0 - 36.0 g/dL   RDW 14.7 11.5 - 15.5 %   Platelets 279 150 - 400 K/uL   nRBC 0.0 0.0 - 0.2 %   Neutrophils Relative % 76 %   Neutro Abs 10.0 (H) 1.7 - 7.7 K/uL   Lymphocytes Relative 14 %   Lymphs Abs 1.9 0.7 - 4.0 K/uL   Monocytes Relative 8 %   Monocytes Absolute 1.0 0.1 - 1.0 K/uL   Eosinophils Relative 0 %   Eosinophils Absolute 0.0 0.0 - 0.5 K/uL   Basophils Relative 1 %   Basophils Absolute 0.1 0.0 - 0.1 K/uL   Immature Granulocytes 1 %   Abs Immature Granulocytes 0.15 (H) 0.00 - 0.07 K/uL    Comment: Performed at [facility], [place] [address], [place], [place] [address]  Ammonia     Status: None   Collection Time: 10/19/2018  4:51 PM  Result Value Ref Range   Ammonia 16 9 - 35 umol/L    Comment: Performed at [facility], [place] [address], [place], [place] [address]  Phenytoin level, total     Status: Abnormal   Collection Time: 09/28/2018  5:12 PM  Result Value Ref Range   Phenytoin Lvl 3.2 (L) 10.0 - 20.0 ug/mL    Comment: Performed at [facility], [place] [address] [place], [place] [address]  Protime-INR     Status: Abnormal   Collection Time: 10/19/2018  5:31 PM  Result Value Ref Range   Prothrombin Time 17.2 (H) 11.4 - 15.2 seconds   INR 1.43     Comment: Performed at [facility], [place] [address] [place], [place] [address]  Vitamin B12     Status: None   Collection Time: 10/04/2018  5:48 PM  Result Value Ref Range   Vitamin B-12 799 180 - 914 pg/mL    Comment: (NOTE) This assay is not validated for testing neonatal or myeloproliferative syndrome specimens for Vitamin B12 levels. Performed at [facility], [place] [address], [place], [place] [address]   Iron and TIBC     Status: Abnormal   Collection Time: 10/17/2018  5:48 PM  Result Value Ref Range   Iron 21 (L) 45 - 182 ug/dL   TIBC  124 (L) 250 - 450 ug/dL   Saturation Ratios 17 (L) 17.9 - 39.5 %   UIBC 103 ug/dL    Comment: Performed at [facility], [place] [address] [place], [place] [address]  Ferritin     Status: Abnormal   Collection Time: 10/27/2018  5:48 PM  Result Value Ref Range   Ferritin 1,686 (H) 24 - 336 ng/mL    Comment: Performed at [facility], [place] [address], [place], [place] [address]  Reticulocytes     Status: Abnormal   Collection Time: 10/01/2018  5:48 PM  Result Value Ref Range   Retic Ct Pct 1.7 0.4 - 3.1 %   RBC. 3.39 (L) 4.22 - 5.81 MIL/uL   Retic Count, Absolute 56.3 19.0 - 186.0 K/uL   Immature Retic Fract 18.2 (H) 2.3 - 15.9 %    Comment: Performed at [facility], [place] [address] [place], [place] [address]  CK     Status: Abnormal   Collection Time: 10/14/2018  7:03 PM  Result Value Ref Range   Total CK 898 (H) 49 - 397 U/L  Comment: Performed at [facility], [place] [address], [place], [place] [address]  Magnesium     Status: Abnormal   Collection Time: 10/15/2018  7:03 PM  Result Value Ref Range   Magnesium 2.5 (H) 1.7 - 2.4 mg/dL    Comment: Performed at [facility], [place] [address] [place], [place] [address]  Phosphorus     Status: Abnormal   Collection Time: 10/20/2018  7:03 PM  Result Value Ref Range   Phosphorus 5.0 (H) 2.5 - 4.6 mg/dL    Comment: Performed at [facility], [place] [address], [place],  [address]  I-Stat CG4 Lactic Acid, ED     Status: None   Collection Time: 10/07/2018  7:41 PM  Result Value Ref Range   Lactic Acid, Venous 0.93 0.5 - 1.9 mmol/L   Dg Abdomen 1 View  Result Date: 09/28/2018 CLINICAL DATA:  Constipation. EXAM: ABDOMEN - 1 VIEW COMPARISON:  CT 07/08/2016 FINDINGS: Air-filled normal caliber small bowel in the central and left abdomen. No evidence of obstruction or free air. Moderate stool in the ascending colon. Moderate rectosigmoid stool  with mild rectal distension of 7.3 cm. Pelvic calcifications consistent with phleboliths. Right upper quadrant calcification likely corresponds to gallstone that was seen on prior CT. Lung bases are clear. Scoliotic curvature in degenerative change in the spine. IMPRESSION: 1. No evidence of obstruction or free air. 2. Stool in the rectosigmoid colon with mild rectal distension but no increased stool burden to suggest constipation. 3. Gallstone on prior CT tentatively identified. Electronically Signed   By: Keith Rake M.D.   On: 10/09/2018 19:54   Ct Head Wo Contrast  Result Date: 10/23/2018 CLINICAL DATA:  Decreased appetite EXAM: CT HEAD WITHOUT CONTRAST TECHNIQUE: Contiguous axial images were obtained from the base of the skull through the vertex without intravenous contrast. COMPARISON:  CT 07/18/2018 FINDINGS: Brain: Motion degraded study. No acute territorial infarction, hemorrhage or intracranial mass. Stable atrophy. Stable enlarged ventricles. Periventricular white matter hypodensity. Vascular: No hyperdense vessels.  Carotid vascular calcification. Skull: Normal. Negative for fracture or focal lesion. Sinuses/Orbits: No acute finding. Other: None IMPRESSION: 1. Motion degradation limits the exam 2. No definite CT evidence for acute intracranial abnormality 3. Similar appearance of enlarged ventricular system, atrophy, and mild hypodensity within the bilateral white matter. Electronically Signed   By: Donavan Foil M.D.   On: 10/17/2018 16:53   Dg Chest Port 1 View  Result Date: 10/04/2018 CLINICAL DATA:  Reduced appetite EXAM: PORTABLE CHEST 1 VIEW COMPARISON:  01/29/2017 FINDINGS: The patient is rotated to the right on today's radiograph, reducing diagnostic sensitivity and specificity. Atherosclerotic calcification of the aortic arch. Mild thoracic spondylosis. Accounting for the rotated chest, the lungs appear clear. Old right rib deformities compatible with prior fractures. IMPRESSION: 1.   No active cardiopulmonary disease is radiographically apparent. 2.  Aortic Atherosclerosis (ICD10-I70.0). 3. Old right rib fractures. Electronically Signed   By: Van Clines M.D.   On: 10/27/2018 18:12    Pending Labs Unresulted Labs (From admission, onward)    Start     Ordered   10/06/18 0500  Prealbumin  Tomorrow morning,   R     10/04/2018 1923   10/06/18 0500  CK  Tomorrow morning,   R     10/04/2018 2004   10/10/2018 1925  Osmolality  Add-on,   R     10/24/2018 1924   10/26/2018 1925  TSH  Add-on,   R  10/28/2018 1924   10/04/2018 1925  Hepatitis panel, acute  Tomorrow morning,   R     10/15/2018 1924   10/23/2018 1924  Sodium, urine, random  Once,   R     10/01/2018 1923   10/03/2018 1924  Creatinine, urine, random  Once,   R     10/22/2018 1923   10/23/2018 1924  Osmolality, urine  Once,   R     10/19/2018 1923   10/10/2018 1748  Folate  (Anemia Panel (PNL))  ONCE - STAT,   STAT     10/16/2018 1748   10/17/2018 1706  Blood Culture (routine x 2)  BLOOD CULTURE X 2,   STAT     10/03/2018 1707   09/30/2018 1532  Urine culture  ONCE - STAT,   STAT     10/22/2018 1533   Signed and Held  HIV antibody (Routine Testing)  Tomorrow morning,   R     Signed and Held   Signed and Held  Magnesium  Tomorrow morning,   R    Comments:  Call MD if <1.5    Signed and Held   Signed and Held  Phosphorus  Tomorrow morning,   R     Signed and Held   Signed and Held  TSH  Once,   R    Comments:  Cancel if already done within 1 month and notify MD    Signed and Held   Signed and Held  Comprehensive metabolic panel  Once,   R    Comments:  Cal MD for K<3.5 or >5.0    Signed and Held   Visual merchandiser and Held  CBC  Once,   R    Comments:  Call for hg <8.0    Signed and Held   Visual merchandiser and Held  Troponin I - Add-On to previous collection  Add-on,   R     Signed and Held   Signed and Held  Hemoglobin A1c  Once,   R    Comments:  Cancel if has been done within past month and notify MD    Signed and Held   Signed and Held   Basic metabolic panel  Tomorrow morning,   R    Comments:  Call MD for K,3.5 or >5.0    Signed and Held          Vitals/Pain Today's Vitals   09/29/2018 1430 10/17/2018 1525 10/01/2018 1900  BP: 99/60  (!) 88/69  Pulse: (!) 114  (!) 110  Resp: 18  16  Temp: 99.5 F (37.5 C) 97.7 F (36.5 C)   TempSrc: Oral Rectal   SpO2: 100%  100%    Isolation Precautions No active isolations  Medications Medications  sodium chloride 0.9 % bolus 500 mL (0 mLs Intravenous Stopped 10/12/2018 1944)  sodium chloride 0.9 % bolus 1,000 mL (0 mLs Intravenous Stopped 10/09/2018 1944)    And  sodium chloride 0.9 % bolus 500 mL (0 mLs Intravenous Stopped 10/16/2018 2009)    And  sodium chloride 0.9 % bolus 250 mL (0 mLs Intravenous Stopped 10/01/2018 2010)  thiamine (B-1) injection 100 mg (100 mg Intravenous Given 10/26/2018 2011)    Mobility non-ambulatory

## 2018-10-05 NOTE — Progress Notes (Signed)
CRITICAL VALUE ALERT  Critical Value:  Serum Osmolality 329  Date & Time Notied:  10/08/2018 2356  Provider Notified: Kennon Holter  Orders Received/Actions taken: Awaiting orders

## 2018-10-05 NOTE — Clinical Social Work Note (Signed)
Clinical Social Work Assessment  Patient Details  Name: Sean Murray MRN: [id number] Date of Birth: 1956-02-04  Date of referral:  10/26/2018               Reason for consult:  Facility Placement                Permission sought to share information with:  Facility Art therapist granted to share information::  Yes, Verbal Permission Granted  Name::        Agency::     Relationship::     Contact Information:     Housing/Transportation Living arrangements for the past 2 months:  Group Home Source of Information:  Facility, Siblings Patient Interpreter Needed:  None Criminal Activity/Legal Involvement Pertinent to Current Situation/Hospitalization:    Significant Relationships:  Siblings(Facility) Lives with:  Self Do you feel safe going back to the place where you live?  No Need for family participation in patient care:  Yes (Comment)  Care giving concerns:  CSW met with the EDP who is still assessing for possible admission criteria.  CSW met with pt's "caregiver" who is the pt's group home supervisor Guadalupe Dawn BSN, RN.  Per Miss Percell Miller, pt's group home is intended for client's whose ultimate goal is to "learn life skills so that they can transition into independent living but pt's caregiver stated that for most residents this is not likely.   Per pt's care giver she has guardianship papers but would fax them to the CSW's secured fax machine on the locked 2nd floor of the CSW's office to [phone] on the morning of 10/06/17 with attn to: Kenzie/Dona Walby.  Per caregiver pt's sister and legal guardian lives in [place], [place], but is willing to drive to the hospital if necessary.  Social Worker assessment / plan:  CSW spoke the pt's sister and legal guardian and updated her.  Pt's sister stated she would provide permission to the [place] DEPT to send referrals out to the [place] area SNF's but states it is possbile that if a stay becomes long-term that the pt's  sister may advocate for long-term placement nearer to her home in [place], [place].  Employment status:  Unemployed Forensic scientist:  Medicaid In [place], [place] PT Recommendations:  Not assessed at this time Information / Referral to community resources:     Patient/Family's Response to care:  Patient not alert and oriented.  Patient's sister/LG and caregiver agreeable to plan.  Pt's caregiver and sister/LG supportive and strongly involved in pt.'s care.  Pt.'s sister/LG pleasant and appreciated CSW intervention.    Patient/Family's Understanding of and Emotional Response to Diagnosis, Current Treatment, and Prognosis:  Still assessing  Emotional Assessment Appearance:  Appears older than stated age Attitude/Demeanor/Rapport:    Affect (typically observed):  Unable to Assess Orientation:  Fluctuating Orientation (Suspected and/or reported Sundowners) Alcohol / Substance use:    Psych involvement (Current and /or in the community):     Discharge Needs  Concerns to be addressed:  No discharge needs identified Readmission within the last 30 days:    Current discharge risk:  None Barriers to Discharge:  No Barriers Identified   Claudine Mouton, LCSWA 10/01/2018, 10:45 PM

## 2018-10-05 NOTE — Progress Notes (Signed)
CSW met with the EDP who is still assessing for possible admission criteria.  CSW met with pt's "caregiver" who is the pt's group home supervisor Guadalupe Dawn BSN, RN.  Per Miss Percell Miller, pt's group home is intended for client's whose ultimate goal is to "learn life skills so that they can transition into independent living but pt's caregiver stated that for most residents this is not likely.   Per pt's care giver she has guardianship papers but would fax them to the CSW's secured fax machine on the locked 2nd floor of the CSW's office to [phone] on the morning of 10/06/17 with attn to: Kenzie/Niyana Chesbro.  Per caregiver pt's sister and legal guardian lives in [place], [place], but is willing to drive to the hospital if necessary.  CSW spoke the pt's sister and legal guardian and updated her.  Pt's sister stated she would provide permission to the [place] DEPT to send referrals out to the [place] area SNF's but states it is possbile that if a stay becomes long-term that the pt's sister may advocate for long-term placement nearer to her home in [place], [place] will continue to follow for D/C needs.  Alphonse Guild. Sala [place], LCSW, LCAS, CSI Clinical Social Worker Ph: [phone]

## 2018-10-05 NOTE — ED Notes (Signed)
Bed: WA15 Expected date:  Expected time:  Means of arrival:  Comments: EMS-FTT 

## 2018-10-05 NOTE — Progress Notes (Addendum)
Consult request has been received. CSW attempting to follow up at present time.  Per EPD, pt is in a group home but the group home is medically insufficient for some of the pt's needs, per the pt's caregiver. Pt's caregiver who is with the pt is also the pt's group home supervisor.    Per the EPD, the pt's sister is his legal guardian Fenton Malling at ph: [phone] Cell: [phone] (pt's previous guardian was a sister who has passed away, per the EPD who spoke to the caregiver).  Per notes, the pt's group home is called [facility]  Alphonse Guild. Emrik Erhard, LCSW, LCAS, CSI Clinical Social Worker Ph: [phone]

## 2018-10-05 NOTE — H&P (Signed)
[name] BSW:[id number] DOB: 20-Nov-1955 DOA: 10/13/2018     PCP: [name], MD   Outpatient Specialists: [facility]   [facility]     Patient arrived to ER on 10/04/2018 at 1420  Patient coming from:   From facility [facility] Group home  Chief Complaint:  Chief Complaint  Patient presents with  . Failure To Thrive    HPI: Sean Murray is a 63 y.o. male with medical history significant of MR    Presented with  Decreased PO intake for the past 3 days Pt at baseline wheelchair bound.  History of mental retardation Is previous legal guardian has passed away and now has a new legal guardian. He is unable to provide any history but per caregiver no recent fevers or chills no complaints of any pain. He has dementia at baseline and have been progressively deteriorating In fall he was still able to walk.   Regarding pertinent Chronic problems: hx of MR Hx of Seizure since September on Dilantin have been increasing his levels   While in ER: Noted to have acute renal failure elevated LFTs and hyponatremia if evidence of dehydration The following Work up has been ordered so far:  Orders Placed This Encounter  Procedures  . Urine culture  . Blood Culture (routine x 2)  . CT HEAD WO CONTRAST  . DG Chest Port 1 View  . DG Abdomen 1 View  . Urinalysis, Routine w reflex microscopic  . Comprehensive metabolic panel  . Brain natriuretic peptide  . CBC with Differential  . Ammonia  . Phenytoin level, total  . Protime-INR  . Vitamin B12  . Folate  . Iron and TIBC  . Ferritin  . Reticulocytes  . CK  . Magnesium  . Phosphorus  . Diet NPO time specified  . Check Rectal Temperature  . Cardiac monitoring  . Cardiac monitoring  . Initiate Carrier Fluid Protocol  . Refer to Sidebar Report for: Sepsis Bundle ED/IP  . Document vital signs within 1-hour of fluid bolus completion and notify provider of bolus completion  . Document Actual / Estimated  Weight  . In and Out Cath  . Vital signs  . Consult to social work  . Consult to hospitalist  . Pulse oximetry, continuous  . POC occult blood, ED Provider will collect  . I-Stat Troponin, ED (not at [facility])  . I-Stat CG4 Lactic Acid, ED  . CBG monitoring, ED  . ED EKG  . EKG 12-Lead  . EKG 12-Lead  . Insert peripheral IV      Following Medications were ordered in ER: Medications  sodium chloride 0.9 % bolus 1,000 mL (1,000 mLs Intravenous New Bag/Given 10/07/2018 1807)    And  sodium chloride 0.9 % bolus 500 mL (500 mLs Intravenous New Bag/Given 10/25/2018 1908)    And  sodium chloride 0.9 % bolus 250 mL (has no administration in time range)  thiamine (B-1) injection 100 mg (has no administration in time range)  sodium chloride 0.9 % bolus 500 mL (500 mLs Intravenous New Bag/Given 10/04/2018 1807)    Significant initial  Findings: Abnormal Labs Reviewed  COMPREHENSIVE METABOLIC PANEL - Abnormal; Notable for the following components:      Result Value   Sodium 151 (*)    Chloride 114 (*)    Glucose, Bld 130 (*)    BUN 66 (*)    Creatinine, Ser 2.04 (*)    Calcium 8.5 (*)    Albumin 2.6 (*)  AST 78 (*)    ALT 54 (*)    GFR calc non Af Amer 33 (*)    GFR calc Af Amer 39 (*)    All other components within normal limits  CBC WITH DIFFERENTIAL/PLATELET - Abnormal; Notable for the following components:   WBC 13.2 (*)    RBC 3.31 (*)    Hemoglobin 10.9 (*)    HCT 36.1 (*)    MCV 109.1 (*)    Neutro Abs 10.0 (*)    Abs Immature Granulocytes 0.15 (*)    All other components within normal limits  PHENYTOIN LEVEL, TOTAL - Abnormal; Notable for the following components:   Phenytoin Lvl 3.2 (*)    All other components within normal limits  PROTIME-INR - Abnormal; Notable for the following components:   Prothrombin Time 17.2 (*)    All other components within normal limits  RETICULOCYTES - Abnormal; Notable for the following components:   RBC. 3.39 (*)    Immature Retic Fract 18.2  (*)    All other components within normal limits  I-STAT CG4 LACTIC ACID, ED - Abnormal; Notable for the following components:   Lactic Acid, Venous 2.23 (*)    All other components within normal limits  CBG MONITORING, ED - Abnormal; Notable for the following components:   Glucose-Capillary 125 (*)    All other components within normal limits     Lactic Acid, Venous    Component Value Date/Time   LATICACIDVEN 2.23 (HH) 10/20/2018 1613   INR 1.43  Na 151 K 4.2  Cr  Up from baseline see below Lab Results  Component Value Date   CREATININE 2.04 (H) 09/30/2018   CREATININE 1.24 07/12/2018   CREATININE 1.28 (H) 05/23/2018    ALB 2.6  WBC  13.2  HG/HCT   Down   from baseline see below    Component Value Date/Time   HGB 10.9 (L) 10/22/2018 1651   HGB 12.6 (L) 07/12/2018 1023   HCT 36.1 (L) 10/25/2018 1651   HCT 39.3 07/12/2018 1023     Troponin (Point of Care Test) Recent Labs    10/15/2018 1611  TROPIPOC 0.01    BNP (last 3 results) Recent Labs    10/21/2018 1651  BNP 51.0    ProBNP (last 3 results) No results for input(s): PROBNP in the last 8760 hours.  Ammonia 16   UA  no evidence of UTI   CT HEAD  NON acute  CXR - NON acute  KUB some stool    ECG:  Personally reviewed by me showing: HR : 116 Rhythm:  Sinus tachycardia , RBBB,    no evidence of ischemic changes QTC 528     ED Triage Vitals [09/28/2018 1430]  Enc Vitals Group     BP 99/60     Pulse Rate (!) 114     Resp 18     Temp 99.5 F (37.5 C)     Temp Source Oral     SpO2 100 %     Weight      Height      Head Circumference      Peak Flow      Pain Score      Pain Loc      Pain Edu?      Excl. in [place]?   [facility](89)@       Latest  Blood pressure 99/60, pulse (!) 114, temperature 97.7 F (36.5 C), temperature source Rectal, resp. rate 18, SpO2 100 %.  Hospitalist was called for admission for severe dehydration with hypernatremia    Review of Systems:    Pertinent  positives include: fatigue, weight loss, ulcers  Constitutional:  No weight loss, night sweats, Fevers, chills,  HEENT:  No headaches, Difficulty swallowing,Tooth/dental problems,Sore throat,  No sneezing, itching, ear ache, nasal congestion, post nasal drip,  Cardio-vascular:  No chest pain, Orthopnea, PND, anasarca, dizziness, palpitations.no Bilateral lower extremity swelling  GI:  No heartburn, indigestion, abdominal pain, nausea, vomiting, diarrhea, change in bowel habits, loss of appetite, melena, blood in stool, hematemesis Resp:  no shortness of breath at rest. No dyspnea on exertion, No excess mucus, no productive cough, No non-productive cough, No coughing up of blood.No change in color of mucus.No wheezing. Skin:  no rash or lesions. No jaundice GU:  no dysuria, change in color of urine, no urgency or frequency. No straining to urinate.  No flank pain.  Musculoskeletal:  No joint pain or no joint swelling. No decreased range of motion. No back pain.  Psych:  No change in mood or affect. No depression or anxiety. No memory loss.  Neuro: no localizing neurological complaints, no tingling, no weakness, no double vision, no gait abnormality, no slurred speech, no confusion  All systems reviewed and apart from [place] all are negative  Past Medical History:   Past Medical History:  Diagnosis Date  . Abnormal EKG    HX LEFT BUNDLE BRANCH BLOCK ON 01-11-2004 EKG ON CHART, NORMAL STRESS TEST 2005  . Bladder mass    noted per ct 10/ 2017  . Cholelithiasis    one small gallstone noted per ct 10/ 2017   . CKD (chronic kidney disease), stage III (HCC)    PER PCP NOTE DR HUSAIN (last GFR 52)  --11/06/2016 CKD STAGE 3   . Down syndrome    high function  . Down's syndrome 04/13/2017  . Family history of liver cancer    both of his sister's died from liver cancer  . Foot deformity   . GERD (gastroesophageal reflux disease)   . Gout   . Gouty arthritis    last flare-up  approx.  11/ 2017  . History of Helicobacter pylori infection    followed by dr Michail Sermon [facility])  . HOH (hard of hearing)   . Hyperlipidemia   . Left bundle branch block (LBBB)    per pcp note dr Lysle Rubens LBBB  has been a known before (11-06-2016)  . Lives in independent group home pt lived at home w/ parents until deceased then his 2 sisters were his caregivers until they both decreased -- he now lives at independent group home since 2017--   [name] is name of home (independent w/ guidance, communicates well) and is monitored medically by [facility]  . Memory disorder 04/13/2017  . OA (osteoarthritis)       Past Surgical History:  Procedure Laterality Date  . COLONOSCOPY  03/13/2010  . CYSTOSCOPY W/ RETROGRADES Bilateral 11/09/2016   Procedure: CYSTOSCOPY WITH RETROGRADE PYELOGRAM;  Surgeon: Cleon Gustin, MD;  Location: [facility];  Service: Urology;  Laterality: Bilateral;  . CYSTOSCOPY WITH BIOPSY N/A 11/09/2016   Procedure: CYSTOSCOPY WITH  BLADDER BIOPSY, FULGERATION;  Surgeon: Cleon Gustin, MD;  [facility] [address];  Service: Urology;  Laterality: N/A;  . ESOPHAGOGASTRODUODENOSCOPY (EGD) WITH PROPOFOL N/A 12/23/2016   Procedure: ESOPHAGOGASTRODUODENOSCOPY (EGD) WITH PROPOFOL;  Surgeon: Wilford Corner, MD;  Location: [facility];  Service: Endoscopy;  Laterality: N/A;  Social History:  Ambulatory  wheelchair bound,       reports that he has never smoked. He has never used smokeless tobacco. He reports that he does not drink alcohol or use drugs.     Family History:   Family History  Problem Relation Age of Onset  . Liver cancer Sister     Allergies: Allergies  Allergen Reactions  . Amoxicillin Rash    Unsure wasn't listed on [facility] 01/29/17.  Severe rash Unsure wasn't listed on [facility] 01/29/17.  Severe rash  . Nsaids Other (See Comments)    Avoid NSAIDS--  Strong family hx liver cancer Avoid NSAIDS--  Strong family hx  liver cancer     Prior to Admission medications   Medication Sig Start Date End Date Taking? Authorizing Provider  acetaminophen (TYLENOL) 500 MG tablet Take 500-1,000 mg by mouth every 6 (six) hours as needed for moderate pain.    Yes [provider]  allopurinol (ZYLOPRIM) 300 MG tablet Take 300 mg by mouth every morning.   Yes [provider]  clotrimazole (LOTRIMIN) 1 % cream Apply to affected area 2 times daily Patient taking differently: Apply 1 application topically 2 (two) times daily. Apply to groin rash 01/16/18  Yes Law, Bea Graff, PA-C  colchicine 0.6 MG tablet Take 0.6 mg by mouth 2 (two) times daily as needed (gout flare). For up to 7 days   Yes [provider]  ENSURE PLUS (ENSURE PLUS) LIQD Take 237 mLs by mouth 2 (two) times daily between meals.   Yes [provider]  famotidine (PEPCID) 20 MG tablet Take 20 mg by mouth at bedtime.   Yes [provider]  Lutein 20 MG TABS Take 20 mg by mouth 2 (two) times daily.    Yes [provider]  memantine (NAMENDA) 5 MG tablet Take 5 mg by mouth at bedtime.   Yes [provider]  mirabegron ER (MYRBETRIQ) 25 MG TB24 tablet Take 25 mg by mouth daily.   Yes [provider]  Multiple Vitamins-Minerals (MULTIVITAMIN WITH MINERALS) tablet Take 1 tablet by mouth every morning.    Yes [provider]  phenytoin (DILANTIN) 100 MG ER capsule Take 3 capsules (300 mg total) by mouth at bedtime. 06/20/18  Yes Kathrynn Ducking, MD  phenytoin (DILANTIN) 50 MG tablet Chew 1 tablet (50 mg total) by mouth daily. 07/13/18  Yes Kathrynn Ducking, MD  simvastatin (ZOCOR) 20 MG tablet Take 20 mg by mouth every evening.    Yes [provider]  tamsulosin (FLOMAX) 0.4 MG CAPS capsule Take 0.4 mg by mouth at bedtime.   Yes [provider]  triamcinolone cream (KENALOG) 0.1 % Apply 1 application topically 2 (two) times daily as needed (rash).    Yes [provider]  bacitracin ointment Apply 1 application topically 2 (two) times daily. Patient not taking: Reported on 10/04/2018 07/18/18   Jacqlyn Larsen, PA-C  gentamicin cream (GARAMYCIN) 0.1 % Apply 1 application topically 3 (three) times daily. Patient not taking: Reported on 09/28/2018 09/22/17   Edrick Kins, DPM   Physical Exam: Blood pressure 99/60, pulse (!) 114, temperature 97.7 F (36.5 C), temperature source Rectal, resp. rate 18, SpO2 100 %. 1. General:  in No Acute distress  Chronically ill -appearing 2. Psychological: somnolent not Oriented 3. Head/ENT:   Dry Mucous Membranes  Head Non traumatic, neck supple                           Poor Dentition 4. SKIN:   decreased Skin turgor,  Skin clean Dry multiple ulcers and area of breakdown      5. Heart: Regular rate and rhythm no Murmur, no Rub or gallop 6. Lungs:   no wheezes or crackles   7. Abdomen: Soft,  non-tender, Non distended bowel sounds present 8. Lower extremities: no clubbing, cyanosis, or  Edema L>R 9. Neurologically not cooperative 10. MSK: Normal range of motion   LABS:     Recent Labs  Lab 09/30/2018 1651  WBC 13.2*  NEUTROABS 10.0*  HGB 10.9*  HCT 36.1*  MCV 109.1*  PLT 378   Basic Metabolic Panel: Recent Labs  Lab 10/24/2018 1651  NA 151*  K 4.2  CL 114*  CO2 26  GLUCOSE 130*  BUN 66*  CREATININE 2.04*  CALCIUM 8.5*      Recent Labs  Lab 10/03/2018 1651  AST 78*  ALT 54*  ALKPHOS 101  BILITOT 0.4  PROT 7.1  ALBUMIN 2.6*   No results for input(s): LIPASE, AMYLASE in the last 168 hours. Recent Labs  Lab 10/07/2018 1651  AMMONIA 16      HbA1C: No results for input(s): HGBA1C in the last 72 hours. CBG: Recent Labs  Lab 10/26/2018 1645  GLUCAP 125*      Urine analysis:    Component Value Date/Time   COLORURINE YELLOW 10/11/2018 1531   APPEARANCEUR CLEAR 10/16/2018 1531   LABSPEC 1.023 10/19/2018 1531   PHURINE 5.0 10/08/2018 1531    GLUCOSEU NEGATIVE 10/09/2018 1531   HGBUR NEGATIVE 10/10/2018 1531   BILIRUBINUR NEGATIVE 10/26/2018 1531   KETONESUR NEGATIVE 10/09/2018 1531   PROTEINUR NEGATIVE 10/02/2018 1531   NITRITE NEGATIVE 10/16/2018 1531   LEUKOCYTESUR NEGATIVE 10/03/2018 1531      Cultures:    Component Value Date/Time   SDES FLUID LEFT KNEE 01/19/2016 1001   SDES FLUID LEFT KNEE 01/19/2016 1001   SPECREQUEST NONE 01/19/2016 1001   SPECREQUEST NONE 01/19/2016 1001   CULT NO GROWTH 5 DAYS 01/19/2016 1001   REPTSTATUS 01/19/2016 FINAL 01/19/2016 1001   REPTSTATUS 01/24/2016 FINAL 01/19/2016 1001     Radiological Exams on Admission: Ct Head Wo Contrast  Result Date: 10/07/2018 CLINICAL DATA:  Decreased appetite EXAM: CT HEAD WITHOUT CONTRAST TECHNIQUE: Contiguous axial images were obtained from the base of the skull through the vertex without intravenous contrast. COMPARISON:  CT 07/18/2018 FINDINGS: Brain: Motion degraded study. No acute territorial infarction, hemorrhage or intracranial mass. Stable atrophy. Stable enlarged ventricles. Periventricular white matter hypodensity. Vascular: No hyperdense vessels.  Carotid vascular calcification. Skull: Normal. Negative for fracture or focal lesion. Sinuses/Orbits: No acute finding. Other: None IMPRESSION: 1. Motion degradation limits the exam 2. No definite CT evidence for acute intracranial abnormality 3. Similar appearance of enlarged ventricular system, atrophy, and mild hypodensity within the bilateral white matter. Electronically Signed   By: Donavan Foil M.D.   On: 09/30/2018 16:53   Dg Chest Port 1 View  Result Date: 09/30/2018 CLINICAL DATA:  Reduced appetite EXAM: PORTABLE CHEST 1 VIEW COMPARISON:  01/29/2017 FINDINGS: The patient is rotated to the right on today's radiograph, reducing diagnostic sensitivity and specificity. Atherosclerotic calcification of the aortic arch. Mild thoracic spondylosis. Accounting for the rotated chest, the lungs appear clear.  Old right rib deformities compatible with prior fractures. IMPRESSION: 1.  No active cardiopulmonary disease is radiographically apparent. 2.  Aortic Atherosclerosis (ICD10-I70.0). 3. Old right rib fractures. Electronically Signed   By: Van Clines M.D.   On: 09/29/2018 18:12    Chart has been reviewed    Assessment/Plan   63 y.o. male with medical history significant of MR Admitted for severe dydration, hypernatremia  Present on Admission: . Hypernatremia -likely due to decreased p.o. intake.  Patient with end-stage dementia and baseline mental retardation with progressive decreased p.o. intake will strongly need to discuss with family goals of care will rehydrate and see if able to improve and go back to baseline administer on D5 half-normal and follow sodium levels . Acute renal failure (ARF) (HCC) secondary to decreased p.o. intake will rehydrate follow renal function and follow urine electrolytes . Dehydration rehydrate will need to order nutritional consult speech pathology evaluation palliative care consult to discuss with family overall goals of care  . QT prolongation - - will monitor on tele avoid QT prolonging medications, rehydrate correct electrolytes  Rhabdomyolysis elevated CK likely secondary to decreased p.o. intake rehydrate and follow  Increased LFTs likely in the setting of rhabdomyolysis we will see if improves with IV fluid resuscitation.  Will check hepatitis serologies and obtain right upper quadrant ultrasound given history of gallstones  Evaded lactic acid likely secondary to dehydration improved with IV fluid resuscitation at this point no evidence of infectious process   History of seizure disorder subtherapeutic Dilantin level but no evidence of seizures at this point on Dilantin IV until able to tolerate p.o.  Other plan as per orders.  DVT prophylaxis:  SCD    Code Status:  FULL CODE  as per caregiver reccords    Family Communication:   Family  not at  Bedside    Disposition Plan:      likely will need placement for rehabilitation                                            Would benefit from PT/OT eval prior to DC  Ordered                   Swallow eval - SLP ordered                   Social Work  consulted                   Nutrition    consulted                  Wound care  consulted                   Palliative care    consulted                                         Admission status:    inpatient     Expect 2 midnight stay secondary to severity of patient's current illness including   hemodynamic instability despite optimal treatment (tachycardia  hypotension  )  Severe lab/radiological abnormalities including:   hypernatremia and extensive comorbidities including: Mental retardation secondary to Down syndrome Dementia That are currently affecting medical management.   I expect  patient to be hospitalized for 2 midnights requiring inpatient medical  care.  Patient is at high risk for adverse outcome (such as loss of life or disability) if not treated.  Indication for inpatient stay as follows:  Hemodynamic instability despite maximal medical therapy,      inability to maintain oral hydration     Need for  IV fluids IV medication     Level of care    SDU tele indefinitely please discontinue once patient no longer qualifies    Elinor Kleine 09/29/2018, 8:55 PM    [facility]  Pager [phone]   after 2 AM please page floor coverage PA If 7AM-7PM, please contact the day team taking care of the patient  [email or web address]  Password [email or web address]

## 2018-10-05 NOTE — ED Triage Notes (Signed)
Per EMS, pt has decreased appetite for a week, no oral intake for the past 3 days. Pt has hx of MR. Pt has been at baseline mental status, does not appear to be in pain.  BP 90/50 HR 110 SpO2 95% CBG 138

## 2018-10-05 NOTE — ED Provider Notes (Signed)
[facility] DEPT Provider Note   CSN: [id number] Arrival date & time: 10/23/2018  1420     History   Chief Complaint Chief Complaint  Patient presents with  . Failure To Thrive    HPI Sean Murray is a 63 y.o. male.  HPI   Patient is a 63 year old male with a history of Down syndrome, dementia, bladder mass, gout, CKD, hyperlipidemia, presenting for decline in functional status and refusal to eat.  History entirely obtained from group home supervisor, Guadalupe Dawn, RN. Patient presents with group home supervisor.  Patient lives in a group home that has minimal medical capabilities, and patient's primary doctor was concerned that patient had acute medical problems requiring worsening function.  Cording to patient's group home supervisor, he has been steadily declining in his functional status over the past couple months.  She reports that previously, 3 months ago he was able to ambulate and perform some ADLs for himself.  He would also feed himself.  Over the past several weeks, she reports that he has been entirely wheelchair-bound, in the past 2 to 3 days, he does not want to eat anything.  Ms. Percell Miller does note that patient had a fall from his wheelchair going forward 1.5 weeks ago, was not evaluated.  She reports that his vital signs have been afebrile at the facility, his blood pressure is always slightly low with systolic pressures around 100.  Group home supervisor denies any emesis or diarrhea from patient.  She reports that she believes last bowel movement was yesterday.  He is entirely incontinent.  Patient has a guardian and power of attorney who is his sister.  Patient does have an extensive family history of liver cancer, and has lost 2 siblings of liver cancer.  Past Medical History:  Diagnosis Date  . Abnormal EKG    HX LEFT BUNDLE BRANCH BLOCK ON 01-11-2004 EKG ON CHART, NORMAL STRESS TEST 2005  . Bladder mass    noted per ct 10/ 2017  .  Cholelithiasis    one small gallstone noted per ct 10/ 2017   . CKD (chronic kidney disease), stage III (HCC)    PER PCP NOTE DR HUSAIN (last GFR 52)  --11/06/2016 CKD STAGE 3   . Down syndrome    high function  . Down's syndrome 04/13/2017  . Family history of liver cancer    both of his sister's died from liver cancer  . Foot deformity   . GERD (gastroesophageal reflux disease)   . Gout   . Gouty arthritis    last flare-up  approx. 11/ 2017  . History of Helicobacter pylori infection    followed by dr Michail Sermon [facility])  . HOH (hard of hearing)   . Hyperlipidemia   . Left bundle branch block (LBBB)    per pcp note dr Lysle Rubens LBBB  has been a known before (11-06-2016)  . Lives in independent group home pt lived at home w/ parents until deceased then his 2 sisters were his caregivers until they both decreased -- he now lives at independent group home since 2017--   [name] is name of home (independent w/ guidance, communicates well) and is monitored medically by [employer]  . Memory disorder 04/13/2017  . OA (osteoarthritis)     Patient Active Problem List   Diagnosis Date Noted  . Down's syndrome 04/13/2017  . Memory disorder 04/13/2017  . HOH (hard of hearing) 04/13/2017  . Generalized abdominal pain 12/23/2016  . Abnormal findings-gastrointestinal tract  12/23/2016    Past Surgical History:  Procedure Laterality Date  . COLONOSCOPY  03/13/2010  . CYSTOSCOPY W/ RETROGRADES Bilateral 11/09/2016   Procedure: CYSTOSCOPY WITH RETROGRADE PYELOGRAM;  Surgeon: Cleon Gustin, MD;  Location: [facility];  Service: Urology;  Laterality: Bilateral;  . CYSTOSCOPY WITH BIOPSY N/A 11/09/2016   Procedure: CYSTOSCOPY WITH  BLADDER BIOPSY, FULGERATION;  Surgeon: Cleon Gustin, MD;  Location: [facility];  Service: Urology;  Laterality: N/A;  . ESOPHAGOGASTRODUODENOSCOPY (EGD) WITH PROPOFOL N/A 12/23/2016   Procedure: ESOPHAGOGASTRODUODENOSCOPY  (EGD) WITH PROPOFOL;  Surgeon: Wilford Corner, MD;  Location: [facility];  Service: Endoscopy;  Laterality: N/A;        Home Medications    Prior to Admission medications   Medication Sig Start Date End Date Taking? Authorizing Provider  acetaminophen (TYLENOL) 500 MG tablet Take 500-1,000 mg by mouth every 6 (six) hours as needed for moderate pain.    Yes [provider]  allopurinol (ZYLOPRIM) 300 MG tablet Take 300 mg by mouth every morning.   Yes [provider]  clotrimazole (LOTRIMIN) 1 % cream Apply to affected area 2 times daily Patient taking differently: Apply 1 application topically 2 (two) times daily. Apply to groin rash 01/16/18  Yes Law, Bea Graff, PA-C  colchicine 0.6 MG tablet Take 0.6 mg by mouth 2 (two) times daily as needed (gout flare). For up to 7 days   Yes [provider]  ENSURE PLUS (ENSURE PLUS) LIQD Take 237 mLs by mouth 2 (two) times daily between meals.   Yes [provider]  famotidine (PEPCID) 20 MG tablet Take 20 mg by mouth at bedtime.   Yes [provider]  Lutein 20 MG TABS Take 20 mg by mouth 2 (two) times daily.    Yes [provider]  memantine (NAMENDA) 5 MG tablet Take 5 mg by mouth at bedtime.   Yes [provider]  mirabegron ER (MYRBETRIQ) 25 MG TB24 tablet Take 25 mg by mouth daily.   Yes [provider]  Multiple Vitamins-Minerals (MULTIVITAMIN WITH MINERALS) tablet Take 1 tablet by mouth every morning.    Yes [provider]  phenytoin (DILANTIN) 100 MG ER capsule Take 3 capsules (300 mg total) by mouth at bedtime. 06/20/18  Yes Kathrynn Ducking, MD  phenytoin (DILANTIN) 50 MG tablet Chew 1 tablet (50 mg total) by mouth daily. 07/13/18  Yes Kathrynn Ducking, MD  simvastatin (ZOCOR) 20 MG tablet Take 20 mg by mouth every evening.    Yes [provider]  tamsulosin (FLOMAX) 0.4 MG CAPS capsule Take 0.4 mg by mouth at bedtime.   Yes [provider]  triamcinolone cream (KENALOG) 0.1 % Apply 1 application topically 2 (two) times daily as needed (rash).    Yes [provider]  bacitracin ointment Apply 1 application topically 2 (two) times daily. Patient not taking: Reported on 10/15/2018 07/18/18   Jacqlyn Larsen, PA-C  gentamicin cream (GARAMYCIN) 0.1 % Apply 1 application topically 3 (three) times daily. Patient not taking: Reported on 10/12/2018 09/22/17   Edrick Kins, DPM    Family History No family history on file.  Social History Social History   Tobacco Use  . Smoking status: Never Smoker  . Smokeless tobacco: Never Used  Substance Use Topics  . Alcohol use: No  . Drug use: No     Allergies   Amoxicillin and Nsaids   Review of Systems Review of Systems Unable to  perform. Nonverbal.   Physical Exam Updated Vital Signs BP 99/60   Pulse (!) 114   Temp 97.7 F (36.5 C) (Rectal)   Resp 18   SpO2 100%   Physical Exam Vitals signs and nursing note reviewed. Exam conducted with a chaperone present.  Constitutional:      Appearance: He is well-developed.     Comments: Thin and poorly nourished.  HENT:     Head: Normocephalic and atraumatic.     Mouth/Throat:     Mouth: Mucous membranes are dry.     Comments: Mucous membranes dry.  Eyes:     General:        Right eye: No discharge.        Left eye: No discharge.     Extraocular Movements: Extraocular movements intact.     Conjunctiva/sclera: Conjunctivae normal.     Pupils: Pupils are equal, round, and reactive to light.  Neck:     Musculoskeletal: Normal range of motion and neck supple. No neck rigidity.  Cardiovascular:     Rate and Rhythm: Normal rate and regular rhythm.     Heart sounds: S1 normal and S2 normal. No murmur.  Pulmonary:     Effort: Pulmonary effort is normal.     Breath sounds: Normal breath sounds. No wheezing or rales.  Abdominal:     General: There is no distension.     Palpations: Abdomen is soft.     Tenderness:  There is no abdominal tenderness. There is no guarding.  Genitourinary:    Comments: Sacrum, buttocks, and perineal region without skin breakdown.  Musculoskeletal: Normal range of motion.        General: Deformity present.     Comments: Bilateral toes with deformity suggestive of severe arthritis.  Abrasion of right medial knee.   Lymphadenopathy:     Cervical: No cervical adenopathy.  Skin:    General: Skin is warm and dry.     Findings: No erythema or rash.  Neurological:     Mental Status: He is alert.     Comments: Cranial nerves grossly intact. Patient moves extremities symmetrically and with good coordination.  Psychiatric:        Behavior: Behavior normal.        Thought Content: Thought content normal.        Judgment: Judgment normal.      ED Treatments / Results  Labs (all labs ordered are listed, but only abnormal results are displayed) Labs Reviewed  COMPREHENSIVE METABOLIC PANEL - Abnormal; Notable for the following components:      Result Value   Sodium 151 (*)    Chloride 114 (*)    Glucose, Bld 130 (*)    BUN 66 (*)    Creatinine, Ser 2.04 (*)    Calcium 8.5 (*)    Albumin 2.6 (*)    AST 78 (*)    ALT 54 (*)    GFR calc non Af Amer 33 (*)    GFR calc Af Amer 39 (*)    All other components within normal limits  CBC WITH DIFFERENTIAL/PLATELET - Abnormal; Notable for the following components:   WBC 13.2 (*)    RBC 3.31 (*)    Hemoglobin 10.9 (*)    HCT 36.1 (*)    MCV 109.1 (*)    Neutro Abs 10.0 (*)    Abs Immature Granulocytes 0.15 (*)    All other components within normal limits  I-STAT CG4 LACTIC ACID, ED - Abnormal; Notable  for the following components:   Lactic Acid, Venous 2.23 (*)    All other components within normal limits  CBG MONITORING, ED - Abnormal; Notable for the following components:   Glucose-Capillary 125 (*)    All other components within normal limits  URINE CULTURE  CULTURE, BLOOD (ROUTINE X 2)  CULTURE, BLOOD (ROUTINE X  2)  BRAIN NATRIURETIC PEPTIDE  AMMONIA  URINALYSIS, ROUTINE W REFLEX MICROSCOPIC  PHENYTOIN LEVEL, TOTAL  PROTIME-INR  VITAMIN B12  FOLATE  IRON AND TIBC  FERRITIN  RETICULOCYTES  POC OCCULT BLOOD, ED  I-STAT TROPONIN, ED  I-STAT CG4 LACTIC ACID, ED    EKG EKG Interpretation  Date/Time:  Wednesday October 05 2018 17:09:59 EST Ventricular Rate:  116 PR Interval:    QRS Duration: 128 QT Interval:  380 QTC Calculation: 528 R Axis:   63 Text Interpretation:  Sinus tachycardia Probable left atrial enlargement Left bundle branch block compared to prior tracings, rate faster Confirmed by Daleen Bo [phone]) on 10/08/2018 5:21:05 PM   Radiology Ct Head Wo Contrast  Result Date: 09/30/2018 CLINICAL DATA:  Decreased appetite EXAM: CT HEAD WITHOUT CONTRAST TECHNIQUE: Contiguous axial images were obtained from the base of the skull through the vertex without intravenous contrast. COMPARISON:  CT 07/18/2018 FINDINGS: Brain: Motion degraded study. No acute territorial infarction, hemorrhage or intracranial mass. Stable atrophy. Stable enlarged ventricles. Periventricular white matter hypodensity. Vascular: No hyperdense vessels.  Carotid vascular calcification. Skull: Normal. Negative for fracture or focal lesion. Sinuses/Orbits: No acute finding. Other: None IMPRESSION: 1. Motion degradation limits the exam 2. No definite CT evidence for acute intracranial abnormality 3. Similar appearance of enlarged ventricular system, atrophy, and mild hypodensity within the bilateral white matter. Electronically Signed   By: Donavan Foil M.D.   On: 10/19/2018 16:53   Dg Chest Port 1 View  Result Date: 10/19/2018 CLINICAL DATA:  Reduced appetite EXAM: PORTABLE CHEST 1 VIEW COMPARISON:  01/29/2017 FINDINGS: The patient is rotated to the right on today's radiograph, reducing diagnostic sensitivity and specificity. Atherosclerotic calcification of the aortic arch. Mild thoracic spondylosis. Accounting for the  rotated chest, the lungs appear clear. Old right rib deformities compatible with prior fractures. IMPRESSION: 1.  No active cardiopulmonary disease is radiographically apparent. 2.  Aortic Atherosclerosis (ICD10-I70.0). 3. Old right rib fractures. Electronically Signed   By: Van Clines M.D.   On: 09/29/2018 18:12    Procedures Procedures (including critical care time)  Medications Ordered in ED Medications  sodium chloride 0.9 % bolus 500 mL (has no administration in time range)  sodium chloride 0.9 % bolus 1,000 mL (has no administration in time range)    And  sodium chloride 0.9 % bolus 500 mL (has no administration in time range)    And  sodium chloride 0.9 % bolus 250 mL (has no administration in time range)     Initial Impression / Assessment and Plan / ED Course  I have reviewed the triage vital signs and the nursing notes.  Pertinent labs & imaging results that were available during my care of the patient were reviewed by me and considered in my medical decision making (see chart for details).  Clinical Course as of Oct 05 1930  Wed Oct 05, 2018  1557 Rectal temp not elevated.   Temp: 97.7 F (36.5 C) [AM]  1730 Creatinine(!): 2.04 [AM]  1730 Suggestive of dehydration.   BUN(!): 66 [AM]  1906 Spoke with Dr. Roel Cluck of [facility] who will admit patient. Appreciate her involvement in  the care of this patient.    [AM]    Clinical Course User Index [AM] Albesa Seen, PA-C   Patient nontoxic-appearing, afebrile with a rectal temp, did have initial tachycardic reading.  Blood pressure appears to be at baseline.  Differential diagnosis includes worsening dementia, acute infection including pneumonia, urinary tract infection, electrolyte abnormality, iatrogenic encephalopathy, SDH.   Will obtain CBC, CMP, urinalysis, chest x-ray, CT scan, lactic acid, blood cultures, troponin, EKG.  Work-up demonstrating elevated BUN and creatinine.  Patient's baseline creatinine about  1.3.  Patient has hypernatremia of 151, with clinical hypovolemia.  Chest x-ray clear of cardiopulmonary disease.  EKG without evidence of acute ischemia, infarction, or arrhythmia.  Troponin is negative.  Patient has anemia of 10.9 in the setting of likely hemoconcentration.  Patient's baseline hemoglobin is around 12.  Hemoccult performed with no evidence of melena or hematochezia and is negative.  Patient has lactic acidosis of 2.23.  I feel this is likely secondary to dehydration.  Do not suspect sepsis as there are no localizing signs of infection, patient's vital signs are not suggestive of sepsis.  Will admit for further work-up of metabolic encephalopathy. [facility] to admit.   This is a shared visit with Daleen Bo. Patient was independently evaluated by this attending physician. Attending physician consulted in evaluation and admission management.  Final Clinical Impressions(s) / ED Diagnoses   Final diagnoses:  AKI (acute kidney injury) [facility])  Dehydration  Metabolic encephalopathy    ED Discharge Orders    None       Tamala Julian 10/08/2018 1932    Daleen Bo, MD 10/06/18 0002

## 2018-10-05 NOTE — ED Provider Notes (Signed)
  Face-to-face evaluation   History: Patient here with caregiver, from a group home where he lives, with minimal supportive care.  Typically the patient walks, and tolerates, and eats on his own.  Beginning in November 2019 he stopped being able to do any of these things.  Since then he has been in bed.  3 days ago he stopped taking any nutrition or fluids and would not take any of his medication.  He is to be resistant to putting anything in his mouth.  The patient is unable to give any history.  By report, the patient was directed here by his PCP for evaluation, and to consider admission.  There is no reported vomiting, fever, cough or other acute problems.  Physical exam: Under nourished appearance.  Chronically ill appearance.  Very dry oral mucous membranes with skin splitting on lips.  Heart with regular rate and rhythm no murmur lungs clear anteriorly.  Abdomen soft nontender.  Pressure sore right medial knee, patient stays in knee-chest position, by preference.  He is somewhat restless, moving arms and legs continuously.  He is nonverbal.  Medical screening examination/treatment/procedure(s) were conducted as a shared visit with non-physician practitioner(s) and myself.  I personally evaluated the patient during the encounter    Daleen Bo, MD 10/06/18 0002

## 2018-10-05 NOTE — Progress Notes (Signed)
MEDICATION RELATED CONSULT NOTE - INITIAL   Pharmacy Consult for Phenytoin Indication: Continuation of Outpatient phenytoin with conversion to IV  Allergies  Allergen Reactions  . Amoxicillin Rash    Unsure wasn't listed on [facility] 01/29/17.  Severe rash Unsure wasn't listed on [facility] 01/29/17.  Severe rash  . Nsaids Other (See Comments)    Avoid NSAIDS--  Strong family hx liver cancer Avoid NSAIDS--  Strong family hx liver cancer    Patient Measurements:   Adjusted Body Weight:   Vital Signs: Temp: 97.7 F (36.5 C) (01/08 1525) Temp Source: Rectal (01/08 1525) BP: 88/69 (01/08 1900) Pulse Rate: 110 (01/08 1900) Intake/Output from previous day: No intake/output data recorded. Intake/Output from this shift: Total I/O In: 2250 [IV Piggyback:2250] Out: -   Labs: Recent Labs    10/20/2018 1651 10/17/2018 1903  WBC 13.2*  --   HGB 10.9*  --   HCT 36.1*  --   PLT 279  --   CREATININE 2.04*  --   MG  --  2.5*  PHOS  --  5.0*  ALBUMIN 2.6*  --   PROT 7.1  --   AST 78*  --   ALT 54*  --   ALKPHOS 101  --   BILITOT 0.4  --    CrCl cannot be calculated (Unknown ideal weight.).   Microbiology: No results found for this or any previous visit (from the past 720 hour(s)).  Medical History: Past Medical History:  Diagnosis Date  . Abnormal EKG    HX LEFT BUNDLE BRANCH BLOCK ON 01-11-2004 EKG ON CHART, NORMAL STRESS TEST 2005  . Bladder mass    noted per ct 10/ 2017  . Cholelithiasis    one small gallstone noted per ct 10/ 2017   . CKD (chronic kidney disease), stage III (HCC)    PER PCP NOTE DR HUSAIN (last GFR 52)  --11/06/2016 CKD STAGE 3   . Down syndrome    high function  . Down's syndrome 04/13/2017  . Family history of liver cancer    both of his sister's died from liver cancer  . Foot deformity   . GERD (gastroesophageal reflux disease)   . Gout   . Gouty arthritis    last flare-up  approx. 11/ 2017  . History of Helicobacter pylori infection    followed by dr  Michail Sermon [facility])  . HOH (hard of hearing)   . Hyperlipidemia   . Left bundle branch block (LBBB)    per pcp note dr Lysle Rubens LBBB  has been a known before (11-06-2016)  . Lives in independent group home pt lived at home w/ parents until deceased then his 2 sisters were his caregivers until they both decreased -- he now lives at independent group home since 2017--   [name] is name of home (independent w/ guidance, communicates well) and is monitored medically by [employer]  . Memory disorder 04/13/2017  . OA (osteoarthritis)     Medications:  Phenytoin 350 mg daily PTA  Assessment: 63 y/o M with a h/o MR and seizure disorder on phenytoin PTA admitted with failure to thrive. Per medication history, patient has not received phenytoin since last week. Patient is in ARF with dehydration. Patient is not actively seizing and there are no reports of seizures PTA. After discussion with admitting MD, will not load with phenytoin.   Goal of Therapy:  Total phenytoin level: 10-20 mg/L  Plan:  Due to NPO status, will initiate phenytoin 100 mg iv  tid. Will consider checking steady-state levels no sooner than 3-5 days out.   Ulice Dash D 10/23/2018,8:39 PM

## 2018-10-06 ENCOUNTER — Inpatient Hospital Stay (HOSPITAL_COMMUNITY): Payer: Medicare Other

## 2018-10-06 ENCOUNTER — Inpatient Hospital Stay (HOSPITAL_COMMUNITY)
Admit: 2018-10-06 | Discharge: 2018-10-06 | Disposition: A | Discharge status: Home / Self Care | Payer: Medicare Other | Attending: Internal Medicine | Admitting: Internal Medicine

## 2018-10-06 DIAGNOSIS — R600 Localized edema: Secondary | ICD-10-CM

## 2018-10-06 DIAGNOSIS — Z66 Do not resuscitate: Secondary | ICD-10-CM

## 2018-10-06 DIAGNOSIS — Q909 Down syndrome, unspecified: Secondary | ICD-10-CM

## 2018-10-06 DIAGNOSIS — I959 Hypotension, unspecified: Secondary | ICD-10-CM

## 2018-10-06 DIAGNOSIS — F0280 Dementia in other diseases classified elsewhere without behavioral disturbance: Secondary | ICD-10-CM

## 2018-10-06 DIAGNOSIS — Z515 Encounter for palliative care: Secondary | ICD-10-CM

## 2018-10-06 DIAGNOSIS — L8990 Pressure ulcer of unspecified site, unspecified stage: Secondary | ICD-10-CM

## 2018-10-06 LAB — CREATININE, URINE, RANDOM: Creatinine, Urine: 146.52 mg/dL

## 2018-10-06 LAB — COMPREHENSIVE METABOLIC PANEL
ALT: 40 U/L (ref 0–44)
AST: 57 U/L — ABNORMAL HIGH (ref 15–41)
Albumin: 1.9 g/dL — ABNORMAL LOW (ref 3.5–5.0)
Alkaline Phosphatase: 69 U/L (ref 38–126)
Anion gap: 9 (ref 5–15)
BUN: 49 mg/dL — ABNORMAL HIGH (ref 8–23)
CALCIUM: 7.5 mg/dL — AB (ref 8.9–10.3)
CO2: 20 mmol/L — ABNORMAL LOW (ref 22–32)
Chloride: 121 mmol/L — ABNORMAL HIGH (ref 98–111)
Creatinine, Ser: 1.24 mg/dL (ref 0.61–1.24)
GFR calc Af Amer: >60 mL/min (ref >60)
GFR calc non Af Amer: >60 mL/min (ref >60)
Glucose, Bld: 142 mg/dL — ABNORMAL HIGH (ref 70–99)
Potassium: 3.7 mmol/L (ref 3.5–5.1)
Sodium: 150 mmol/L — ABNORMAL HIGH (ref 135–145)
Total Bilirubin: 0.5 mg/dL (ref 0.3–1.2)
Total Protein: 5.3 g/dL — ABNORMAL LOW (ref 6.5–8.1)

## 2018-10-06 LAB — HIV ANTIBODY (ROUTINE TESTING W REFLEX): HIV Screen 4th Generation wRfx: NONREACTIVE

## 2018-10-06 LAB — CBC
HCT: 30.5 % — ABNORMAL LOW (ref 39.0–52.0)
Hemoglobin: 8.8 g/dL — ABNORMAL LOW (ref 13.0–17.0)
MCH: 32.5 pg (ref 26.0–34.0)
MCHC: 28.9 g/dL — ABNORMAL LOW (ref 30.0–36.0)
MCV: 112.5 fL — ABNORMAL HIGH (ref 80.0–100.0)
Platelets: 228 10*3/uL (ref 150–400)
RBC: 2.71 MIL/uL — ABNORMAL LOW (ref 4.22–5.81)
RDW: 14.7 % (ref 11.5–15.5)
WBC: 9.1 10*3/uL (ref 4.0–10.5)
nRBC: 0 % (ref 0.0–0.2)

## 2018-10-06 LAB — CK: Total CK: 728 U/L — ABNORMAL HIGH (ref 49–397)

## 2018-10-06 LAB — CORTISOL: Cortisol, Plasma: 13.6 ug/dL

## 2018-10-06 LAB — TSH: TSH: 4.303 u[IU]/mL (ref 0.350–4.500)

## 2018-10-06 LAB — LACTIC ACID, PLASMA: Lactic Acid, Venous: 0.8 mmol/L (ref 0.5–1.9)

## 2018-10-06 LAB — SODIUM, URINE, RANDOM: Sodium, Ur: 56 mmol/L

## 2018-10-06 LAB — OSMOLALITY, URINE: Osmolality, Ur: 734 mOsm/kg (ref 300–900)

## 2018-10-06 LAB — PROTIME-INR
INR: 1.44
Prothrombin Time: 17.4 seconds — ABNORMAL HIGH (ref 11.4–15.2)

## 2018-10-06 LAB — PHOSPHORUS: Phosphorus: 3.4 mg/dL (ref 2.5–4.6)

## 2018-10-06 LAB — MAGNESIUM: Magnesium: 2.1 mg/dL (ref 1.7–2.4)

## 2018-10-06 LAB — PREALBUMIN: Prealbumin: <5 mg/dL — ABNORMAL LOW (ref 18–38)

## 2018-10-06 LAB — APTT: aPTT: 34 seconds (ref 24–36)

## 2018-10-06 LAB — HEMOGLOBIN A1C
Hgb A1c MFr Bld: 5.4 % (ref 4.8–5.6)
Mean Plasma Glucose: 108.28 mg/dL

## 2018-10-06 MED ORDER — HEPARIN BOLUS VIA INFUSION
1700.0000 [IU] | Freq: Once | INTRAVENOUS | Status: AC
  Administered 2018-10-06: 1700 [IU] via INTRAVENOUS
  Filled 2018-10-06: qty 1700

## 2018-10-06 MED ORDER — SODIUM CHLORIDE 0.9 % IV BOLUS
500.0000 mL | Freq: Once | INTRAVENOUS | Status: AC
  Administered 2018-10-06: 500 mL via INTRAVENOUS

## 2018-10-06 MED ORDER — IOPAMIDOL (ISOVUE-370) INJECTION 76%
100.0000 mL | Freq: Once | INTRAVENOUS | Status: AC | PRN
  Administered 2018-10-06: 100 mL via INTRAVENOUS

## 2018-10-06 MED ORDER — LACTATED RINGERS IV BOLUS
1000.0000 mL | Freq: Once | INTRAVENOUS | Status: AC
  Administered 2018-10-06: 1000 mL via INTRAVENOUS

## 2018-10-06 MED ORDER — GLYCERIN (LAXATIVE) 2.1 G RE SUPP
1.0000 | Freq: Once | RECTAL | Status: AC
  Administered 2018-10-06: 1 via RECTAL
  Filled 2018-10-06: qty 1

## 2018-10-06 MED ORDER — IOPAMIDOL (ISOVUE-370) INJECTION 76%
INTRAVENOUS | Status: AC
  Filled 2018-10-06: qty 100

## 2018-10-06 MED ORDER — CHLORHEXIDINE GLUCONATE 0.12 % MT SOLN
15.0000 mL | Freq: Two times a day (BID) | OROMUCOSAL | Status: DC
  Administered 2018-10-06 – 2018-10-09 (×7): 15 mL via OROMUCOSAL
  Filled 2018-10-06 (×5): qty 15

## 2018-10-06 MED ORDER — ORAL CARE MOUTH RINSE: 15.0000 mL | Freq: Two times a day (BID) | OROMUCOSAL | Status: DC

## 2018-10-06 MED ORDER — SODIUM CHLORIDE (PF) 0.9 % IJ SOLN
INTRAMUSCULAR | Status: AC
  Filled 2018-10-06: qty 50

## 2018-10-06 MED ORDER — MUPIROCIN 2 % EX OINT
TOPICAL_OINTMENT | Freq: Two times a day (BID) | CUTANEOUS | Status: DC
  Administered 2018-10-06 – 2018-10-14 (×17): via TOPICAL
  Filled 2018-10-06 (×2): qty 22

## 2018-10-06 MED ORDER — SODIUM CHLORIDE 0.9 % IV BOLUS
1000.0000 mL | Freq: Once | INTRAVENOUS | Status: AC
  Administered 2018-10-06: 1000 mL via INTRAVENOUS

## 2018-10-06 MED ORDER — SODIUM CHLORIDE 0.9 % IV SOLN
750.0000 mg | Freq: Once | INTRAVENOUS | Status: AC
  Administered 2018-10-06: 750 mg via INTRAVENOUS
  Filled 2018-10-06: qty 15

## 2018-10-06 MED ORDER — DEXTROSE-NACL 5-0.9 % IV SOLN
INTRAVENOUS | Status: DC
  Administered 2018-10-06 – 2018-10-08 (×6): via INTRAVENOUS

## 2018-10-06 MED ORDER — HEPARIN (PORCINE) 25000 UT/250ML-% IV SOLN
1000.0000 [IU]/h | INTRAVENOUS | Status: DC
  Administered 2018-10-06: 1000 [IU]/h via INTRAVENOUS
  Filled 2018-10-06: qty 250

## 2018-10-06 NOTE — Progress Notes (Signed)
EEG Completed; Results Pending  

## 2018-10-06 NOTE — Progress Notes (Signed)
BP soft 89/35 MAP 51. Paged Jeannette Corpus. New order for 1L LR bolus ordered and hung. Will continue to monitor.

## 2018-10-06 NOTE — Progress Notes (Signed)
Pharmacy: Re-heparin  Patient's a 63 y.o M currently on heparin drip for acute LE DVT.  First heparin level now back undetectable at<0.10  (goal 0.3-0.7). Per RN, no issues with IV line and no bleeding noted.  Plan: - heparin 1500 units IV x1 bolus, then increase drip to 1250 units/hr - check 6 hr heparin level - monitor for s/s bleeding  Dia Sitter, PharmD, BCPS 10/07/2018 12:55 AM

## 2018-10-06 NOTE — Progress Notes (Signed)
Notified Dr. Maryland Pink that pt BP is 87/32 (51). Will continue to monitor.

## 2018-10-06 NOTE — Consult Note (Signed)
[facility] Nurse wound consult note Patient receiving care in [place].  No visitors present.  Assisted by his primary RN, Mel Almond. Reason for Consult: Multiple wounds Wound type:See notes below Pressure Injury POA: Yes Measurements: Right trochanter has a DTPI that measures 3 cm x 1.8 cm and is maroon and red in color. Left first metatarsal heal, medial surface, has a DTPI with blood filled bulla, that measures 2 cm x 3 cm. Left toes 3 through 5 have maroon areas of discoloration along the proximal interphalangeal joints. Overlying tissue is intact. The left pretibial area has a linear wound that is dry, brown, and measures 3 cm x 1 cm x 0.3 cm.  It resembles a scratch mark. Just at the left knee, medial side, there is an unstageable PI wound that measures 0.6 cm x 0.8 cm and is yellow and moist. The right medial knee has an unstageable PI wound that measures 3 cm x 2 cm and is also yellow and moist.  It is surrounded by erythema.  Plan of care for these areas: Apply betadine to the following wounds, allow to air dry, and cover with a foam dressing: left trochanter and left great toe.  The foam dressings can remain in place for 3 - 5 days; just lift the dressing, apply the betadine and allow it to dry, then secure the dressing in place.  Left toes 3 through 5 discolored areas:  Apply betadine and allow to air dry. Leave open to air.  For the left pretibial wound, left knee, and right knee: Apply Mupirocin ointment each shift to the following wounds and cover with a foam dressing.  The foam dressing can stay in place 3 - 5 days--just lift to apply the medication.  Apply to the left pre-tibial wound along the shin, the left medial knee wound, the right medial knee wound.  Additional care measures: turn every 2 hours (right or supine); prevalon boots; use pillows to pad between the knees.  Monitor the wound area(s) for worsening of condition such as: Signs/symptoms of infection,  Increase in size,   Development of or worsening of odor, Development of pain, or increased pain at the affected locations.  Notify the medical team if any of these develop.  Thank you for the consult.  Discussed plan of care with the patient and bedside nurse.  [facility] nurse will not follow at this time.  Please re-consult the [facility] team if needed.  Val Riles, RN, MSN, CWOCN, CNS-BC, pager [phone]

## 2018-10-06 NOTE — Evaluation (Signed)
SLP Cancellation Note  Patient Details Name: Georg Ang MRN: [id number] DOB: August 11, 1956   Cancelled treatment:       Reason Eval/Treat Not Completed: Other (comment)(pt lethargic - per rn responds only to pain) Will continue efforts  Macario Golds 10/06/2018, 10:27 AM  Luanna Salk, [facility] SLP [place] Pager [phone] Office [phone]

## 2018-10-06 NOTE — Progress Notes (Signed)
*  Preliminary Results* Left lower extremity venous duplex completed. Left lower extremity is positive for acute deep vein thrombosis involving the proximal, mid, and distal femoral vein, and the popliteal vein. There is no evidence of left Baker's cyst.  Preliminary results discussed with Dr. Maryland Pink.  10/06/2018 2:53 PM  Maudry Mayhew, MHA, RVT, RDCS, RDMS

## 2018-10-06 NOTE — Progress Notes (Signed)
Initial Nutrition Assessment  DOCUMENTATION CODES:   Not applicable  INTERVENTION:  - Diet advancement as medically feasible. - Will provide appropriate recommendations or interventions at follow-up.    NUTRITION DIAGNOSIS:   Inadequate oral intake related to inability to eat as evidenced by NPO status.  GOAL:   Patient will meet greater than or equal to 90% of their needs  MONITOR:   Diet advancement, Weight trends, Labs, Skin  REASON FOR ASSESSMENT:   Malnutrition Screening Tool, Consult Malnutrition Eval  ASSESSMENT:   63 year old male with a past medical history of mental retardation who lives in a group home who is wheelchair-bound at baseline.  His legal guardian, who is his sister, lives in [place], [place].  Patient was brought into the hospital due to decreased oral intake for the past 3 days.  Patient has dementia at baseline and has been progressively getting worse over the past many months.  BMI indicates normal weight. Patient noted to be wheelchair-bound at baseline. Flowsheet indicates that patient is disoriented x4. No family/visitors present at bedside.  Spoke with RN at bedside. She reports that patient was only agitated during lab draw and wound cleaning. She states that sister stated that she would come to the hospital if needed, but does not currently have a plan to do so. RN is unsure of patient's abilities to participate in ADLs at baseline or if he is able to self-feed or participate in his feeding.   Per chart review, current weight is 115 lb. Weight on 12/24 was 126 lb indicating 11 lb weight loss (8.7% body weight) in a little over 2 weeks. Will need to monitor weight trends during hospitalization to determine if this is accurate.   SLP attempted to perform swallow evaluation this AM, but note indicates that patient was too lethargic.    Medications reviewed; 1 mg IV folic acid/day, 1 tablet senokot BID, 100 mg IV thiamine/day. Labs  reviewed; Na: 150 mmol/L, Cl: 121 mmol/L, BUN: 49 mg/dl, Ca: 7.5 mg/dl. IVF; D5-NS @ 100 mL/hr (408 kcal).   No results found for: FEV1, FVC, FEV1FVC, TLC, DLCO,       NUTRITION - FOCUSED PHYSICAL EXAM:    Most Recent Value  Orbital Region  Unable to assess  Upper Arm Region  No depletion  Thoracic and Lumbar Region  Unable to assess  Buccal Region  Mild depletion  Temple Region  Mild depletion  Clavicle Bone Region  No depletion  Clavicle and Acromion Bone Region  Mild depletion  Scapular Bone Region  Unable to assess  Dorsal Hand  Unable to assess [bilateral mittens]  Patellar Region  No depletion  Anterior Thigh Region  Unable to assess  Posterior Calf Region  Unable to assess  Edema (RD Assessment)  Unable to assess  Hair  Reviewed  Eyes  Reviewed  Mouth  Reviewed [poor dentition]  Skin  Reviewed  Nails  Unable to assess       Diet Order:   Diet Order            Diet NPO time specified Except for: Ice Chips  Diet effective now              EDUCATION NEEDS:   No education needs have been identified at this time  Skin:  Skin Assessment: Skin Integrity Issues: Skin Integrity Issues:: DTI, Unstageable DTI: L IT and toe Unstageable: full thickness to R knee, L foot, L tibia, and L leg  Last BM:  PTA/unknown  Height:  Ht Readings from Last 1 Encounters:  10/06/18 5\' 2"  (1.575 m)    Weight:   Wt Readings from Last 1 Encounters:  10/06/18 52 kg    Ideal Body Weight:  53.63 kg  BMI:  Body mass index is 20.97 kg/m.  Estimated Nutritional Needs:   Kcal:  3967-2897 kcal  Protein:  80-95 grams  Fluid:  >/= 1.7 L/day     Jarome Matin, MS, RD, LDN, [facility] Inpatient Clinical Dietitian Pager # [phone] After hours/weekend pager # [phone]

## 2018-10-06 NOTE — Progress Notes (Signed)
BP still low at 84/45 MAP 57. Paged X. Blount. Will continue to monitor.

## 2018-10-06 NOTE — Progress Notes (Signed)
PT Cancellation Note  Patient Details Name: Jesua Tamblyn MRN: [id number] DOB: 08/24/1956   Cancelled Treatment:     PT order received but eval deferred 2* pt lethargy - RN aware.  Will follow.   Cambria Osten 10/06/2018, 9:45 AM

## 2018-10-06 NOTE — Progress Notes (Signed)
ANTICOAGULATION CONSULT NOTE - Initial Consult  Pharmacy Consult for heparin Indication: DVT  Allergies  Allergen Reactions  . Amoxicillin Rash    Unsure wasn't listed on [facility] 01/29/17.  Severe rash Unsure wasn't listed on [facility] 01/29/17.  Severe rash  . Nsaids Other (See Comments)    Avoid NSAIDS--  Strong family hx liver cancer Avoid NSAIDS--  Strong family hx liver cancer    Patient Measurements: Height: 5\' 2"  (157.5 cm) Weight: 114 lb 10.2 oz (52 kg) IBW/kg (Calculated) : 54.6 Heparin Dosing Weight: 52kg  Vital Signs: Temp: 99.3 F (37.4 C) (01/09 1200) Temp Source: Axillary (01/09 1200) BP: 109/46 (01/09 1100) Pulse Rate: 80 (01/09 1100)  Labs: Recent Labs    10/28/2018 1651 10/19/2018 1731 10/16/2018 1903 10/17/2018 2106 10/10/2018 2255 10/06/18 0248  HGB 10.9*  --   --   --   --  8.8*  HCT 36.1*  --   --   --   --  30.5*  PLT 279  --   --   --   --  228  LABPROT  --  17.2*  --   --   --   --   INR  --  1.43  --   --   --   --   CREATININE 2.04*  --   --   --  1.57* 1.24  CKTOTAL  --   --  898*  --   --  728*  TROPONINI  --   --   --  <0.03  --   --     Estimated Creatinine Clearance: 44.3 mL/min (by C-G formula based on SCr of 1.24 mg/dL).   Medical History: Past Medical History:  Diagnosis Date  . Abnormal EKG    HX LEFT BUNDLE BRANCH BLOCK ON 01-11-2004 EKG ON CHART, NORMAL STRESS TEST 2005  . Bladder mass    noted per ct 10/ 2017  . Cholelithiasis    one small gallstone noted per ct 10/ 2017   . CKD (chronic kidney disease), stage III (HCC)    PER PCP NOTE DR HUSAIN (last GFR 52)  --11/06/2016 CKD STAGE 3   . Down syndrome    high function  . Down's syndrome 04/13/2017  . Family history of liver cancer    both of his sister's died from liver cancer  . Foot deformity   . GERD (gastroesophageal reflux disease)   . Gout   . Gouty arthritis    last flare-up  approx. 11/ 2017  . History of Helicobacter pylori infection    followed by dr Michail Sermon [facility])   . HOH (hard of hearing)   . Hyperlipidemia   . Left bundle branch block (LBBB)    per pcp note dr Lysle Rubens LBBB  has been a known before (11-06-2016)  . Lives in independent group home pt lived at home w/ parents until deceased then his 2 sisters were his caregivers until they both decreased -- he now lives at independent group home since 2017--   [name] is name of home (independent w/ guidance, communicates well) and is monitored medically by [facility]  . Memory disorder 04/13/2017  . OA (osteoarthritis)      Assessment:  63 year old male with a past medical history of mental retardation who lives in a group home who is wheelchair-bound at baseline.  Patient was brought into the hospital due to decreased oral intake for the past 3 days.   Pharmacy has been consulted to dose heparin drip for  DVT.   10/06/2018 H/H low Plts WNL Scr 1.24, CrCl~ 44.55mls/min PTT, PT/INR ordered stat  Goal of Therapy:  Heparin level 0.3-0.7 units/ml Monitor platelets by anticoagulation protocol   Plan:  Heparin load 1700 units x 1 Start heparin drip at 1000 units/hr Check heparin level in 6 hours Daily CBC  Dolly Rias RPh 10/06/2018, 2:44 PM Pager [phone]

## 2018-10-06 NOTE — Consult Note (Signed)
Consultation Note Date: 10/06/2018   Patient Name: Sean Murray  DOB: Aug 17, 1956  MRN: [id number]  Age / Sex: 63 y.o., male  PCP: [name], MD Referring Physician: Bonnielee Haff, MD  Reason for Consultation: Establishing goals of care and Psychosocial/spiritual support  HPI/Patient Profile: 63 y.o. male  with past medical history of Downs Syndrome, LBBB, CKD 3 gouty arthritis, bladder mass (2017), MR, progressive dementia and hearing loss who was admitted on 10/10/2018 with no PO intake for 3 days at his group home [employer] [facility]).  He was found to have acute renal failure, hypernatremia, mild rhabdomyolysis, and elevated LFTs.    At baseline Sean Murray is wheel chair bound, and non verbal, but interactive.  He needs feeding assistance.  Please note - [name] has a Garment/textile technologist.  It is his sister Sean Murray.  Sean Murray is in [place] and is unable to drive to [place] at this point.  The people in [name] room are friends not family.  Sean Murray requests that updates from the medical providers be given to her on [phone].    Clinical Assessment and Goals of Care:  I have reviewed medical records including EPIC notes, labs and imaging, received report from the bedside RN, assessed the patient and then spoke with his sister Sean Murray to discuss diagnosis prognosis, [place], EOL wishes, disposition and options.  I introduced Palliative Medicine as specialized medical care for people living with serious illness. It focuses on providing relief from the symptoms and stress of a serious illness. The goal is to improve quality of life for both the patient and the family.  We discussed a brief life review of the patient.  Sean Murray described a very difficult life for Sean Murray.  Several family members who cared for him passed away.  He was put into the group home about 3 years ago.  Since that time he has stopped talking  and stopped ambulating.  In general Sean Murray describes Sean Murray as "loving everyone".  He loves his bible, church and milkshakes.  We discussed his current illness and what it means in the larger context of his on-going co-morbidities.  Natural disease trajectory and expectations at EOL were discussed.  I attempted to elicit values and goals of care important to the patient.  The difference between aggressive medical intervention and comfort care was considered in light of the patient's goals of care.   Advanced directives, concepts specific to code status, artifical feeding and hydration, and rehospitalization were considered and discussed.  Specifically we discussed dementia and my concern that in the final stages of dementia people stop eating and drinking.  Sean Murray is aware having lost multiple family members over the years.  Per Sean Murray the group home RN has explained to her that Sean Murray has stopped eating and drinking.  We discussed code status.  Sean Murray did not feel full code was appropriate.  If Sean Murray arrests, Sean Murray would want him to be protected and allowed to be peaceful.  I will change his code status to DNR.  I suggested to  Sean Murray that we may hydrate and treat Sean Murray for another 24 hours.  If he becomes more alert and eats/drinks he may have more time.  Otherwise if he continues to not eat or drink he is likely at end of life.  Sean Murray agreed.      While I was talking with Sean Murray the vascular ultrasound result appeared in Epic so I explained to her that Sean Murray has a lower extremity DVT and will require anticoagulation.  I committed to talk with Sean Murray again tomorrow.  Questions and concerns were addressed.   The family was encouraged to call with questions or concerns.    Primary Decision Maker:  LEGAL GUARDIAN  Sean Murray (sister)    SUMMARY OF RECOMMENDATIONS    Continue current plan of care. Change code status to DNR/DNI PMT will round on Sean Murray again tomorrow and confer with Dr. Maryland Pink. If it  appears that Sean Murray will be unable to sustain himself with PO intake  And feeding assistance then I will recommend [facility].   Hopefully he will start eating and drinking. Will give glycerin suppository. As xray suggests a stool ball sitting in his rectum.  Additional Recommendations (Limitations, Scope, Preferences):  Full Scope Treatment  Palliative Prophylaxis:   Aspiration, Frequent Pain Assessment, Palliative Wound Care and Turn Reposition  Psycho-social/Spiritual:   Desire for further Chaplaincy support: yes  Prognosis:  Will be better able to determine in the next day or so    Discharge Planning: To Be Determined      Primary Diagnoses: Present on Admission: . Acute renal failure (ARF) ([place]) . Dehydration . QT prolongation . Hypernatremia   I have reviewed the medical record, interviewed the patient and family, and examined the patient. The following aspects are pertinent.  Past Medical History:  Diagnosis Date  . Abnormal EKG    HX LEFT BUNDLE BRANCH BLOCK ON 01-11-2004 EKG ON CHART, NORMAL STRESS TEST 2005  . Bladder mass    noted per ct 10/ 2017  . Cholelithiasis    one small gallstone noted per ct 10/ 2017   . CKD (chronic kidney disease), stage III (HCC)    PER PCP NOTE DR HUSAIN (last GFR 52)  --11/06/2016 CKD STAGE 3   . Down syndrome    high function  . Down's syndrome 04/13/2017  . Family history of liver cancer    both of his sister's died from liver cancer  . Foot deformity   . GERD (gastroesophageal reflux disease)   . Gout   . Gouty arthritis    last flare-up  approx. 11/ 2017  . History of Helicobacter pylori infection    followed by dr Michail Sermon [facility])  . HOH (hard of hearing)   . Hyperlipidemia   . Left bundle branch block (LBBB)    per pcp note dr Lysle Rubens LBBB  has been a known before (11-06-2016)  . Lives in independent group home pt lived at home w/ parents until deceased then his 2 sisters were his caregivers until they both  decreased -- he now lives at independent group home since 2017--   Sean Murray is name of home (independent w/ guidance, communicates well) and is monitored medically by [employer]  . Memory disorder 04/13/2017  . OA (osteoarthritis)    Social History   Socioeconomic History  . Marital status: Single    Spouse name: Not on file  . Number of children: Not on file  . Years of education: Not on file  . Highest education level:  Not on file  Occupational History  . Not on file  Social Needs  . Financial resource strain: Not on file  . Food insecurity:    Worry: Not on file    Inability: Not on file  . Transportation needs:    Medical: Not on file    Non-medical: Not on file  Tobacco Use  . Smoking status: Never Smoker  . Smokeless tobacco: Never Used  Substance and Sexual Activity  . Alcohol use: No  . Drug use: No  . Sexual activity: Never  Lifestyle  . Physical activity:    Days per week: Not on file    Minutes per session: Not on file  . Stress: Not on file  Relationships  . Social connections:    Talks on phone: Not on file    Gets together: Not on file    Attends religious service: Not on file    Active member of club or organization: Not on file    Attends meetings of clubs or organizations: Not on file    Relationship status: Not on file  Other Topics Concern  . Not on file  Social History Narrative   [facility]ives at [facility] with [facility]   Caffeine use: none   Family History  Problem Relation Age of Onset  . Liver cancer Sister    Scheduled Meds: . allopurinol  300 mg Oral q morning - 10a  . chlorhexidine  15 mL Mouth Rinse BID  . folic acid  1 mg Intravenous Daily  . mouth rinse  15 mL Mouth Rinse BID  . mupirocin ointment   Topical BID  . phenytoin (DILANTIN) IV  100 mg Intravenous Q8H  . senna  1 tablet Oral BID  . simvastatin  20 mg Oral QPM  . thiamine injection  100 mg Intravenous Daily   Continuous Infusions: .  dextrose 5 % and 0.9% NaCl Stopped (10/06/18 1040)   PRN Meds:.acetaminophen **OR** acetaminophen, bisacodyl, HYDROcodone-acetaminophen Allergies  Allergen Reactions  . Amoxicillin Rash    Unsure wasn't listed on [facility] 01/29/17.  Severe rash Unsure wasn't listed on [facility] 01/29/17.  Severe rash  . Nsaids Other (See Comments)    Avoid NSAIDS--  Strong family hx liver cancer Avoid NSAIDS--  Strong family hx liver cancer   Review of Systems patient non verbal  Physical Exam  Elderly male with downs syndrome, non verbal, eyes open. CV rrr resp no distress Abdomen soft, nt, nd Lower ext with edema.  Vital Signs: BP (!) 109/46   Pulse 80   Temp 99.3 F (37.4 C) (Axillary)   Resp 16   Ht 5\' 2"  (1.575 m)   Wt 52 kg   SpO2 98%   BMI 20.97 kg/m  Pain Scale: PAINAD       SpO2: SpO2: 98 % O2 Device:SpO2: 98 % O2 Flow Rate: .   IO: Intake/output summary:   Intake/Output Summary (Last 24 hours) at 10/06/2018 1447 Last data filed at 10/06/2018 1100 Gross per 24 hour  Intake 4665.81 ml  Output 275 ml  Net 4390.81 ml    LBM:   Baseline Weight: Weight: 52 kg Most recent weight: Weight: 52 kg     Palliative Assessment/Data:  10%   Flowsheet Rows     Most Recent Value  Intake Tab  Referral Department  Hospitalist  Unit at Time of Referral  ER  Date Notified  10/06/18  Palliative Care Type  New Palliative care  Reason for referral  Clarify  Goals of Care  Date of Admission  10/26/2018  # of days IP prior to Palliative referral  1  Clinical Assessment  Psychosocial & Spiritual Assessment  Palliative Care Outcomes      Time In: 2:00 Time Out: 3:10 Time Total: 70 min. Greater than 50%  of this time was spent counseling and coordinating care related to the above assessment and plan.  Signed by: Florentina Jenny, PA-C Palliative Medicine Pager: [phone]  Please contact Palliative Medicine Team phone at [phone] for questions and concerns.  For individual provider:  See Shea Evans

## 2018-10-06 NOTE — Procedures (Signed)
ELECTROENCEPHALOGRAM REPORT   Patient: Sean Murray       Room #: 9295 EEG No. ID: [id number] Age: 63 y.o.        Sex: male Referring Physician: [place] Pink Report Date:  10/06/2018        Interpreting Physician: Alexis Goodell  History: [name] is an 63 y.o. male with decreased oral intake and altered mental status  Medications:  B1, Zyloprim, Folic acid, Dilantin, Zocor, Senokot  Conditions of Recording:  This is a 21 channel routine scalp EEG performed with bipolar and monopolar montages arranged in accordance to the international 10/20 system of electrode placement. One channel was dedicated to EKG recording.  The patient is in the altered state.  Description:  The background activity is poorly organized and consists of a mixture of delta and theta activity.  This activity is diffusely distributed but is not continuous.  There are frequent periods of attenuation that are seen intermittently during the recording.  These periods of attenuation are short lasting up to 2 seconds.  There are also generalized periodic epileptifoprm discharges with triphasic morphology.  Although these ae noted intermittently on most occasions there are periods of 3-4 second duration when these discharges are more regular at a frequency of about 3-4/second.  There is no documented change in activity during these periods.  Hyperventilation and intermittent photic stimulation were not performed.  IMPRESSION: This is an abnormal electroencephalogram secondary to general background slowing and GPEDs.  Also noted are short periods when the epileptiform discharges are more regular at a rate of about 3-4 per second.  Unclear significance but can not rule out subclinical seizure activity.  Clinical correlation recommended.   Alexis Goodell, MD Neurology [phone] 10/06/2018, 6:00 PM

## 2018-10-06 NOTE — Progress Notes (Addendum)
[facility] PROGRESS NOTE  Yassin Scales LFY:[id number] DOB: 1956/09/12 DOA: 10/12/2018  PCP: [name], MD  Brief History/Interval Summary: 63 year old male with a past medical history of mental retardation who lives in a group home who is wheelchair-bound at baseline.  Her legal guardian who is apparently his sister who lives and [place].  Patient was brought into the hospital due to decreased oral intake for the past 3 days.  Apparently patient has dementia at baseline and has been progressively getting worse over the past many months.  Reason for Visit: Acute renal failure.  Hyponatremia.  Consultants: Palliative medicine  Procedures: None  Antibiotics: None  Subjective/Interval History: Patient very somnolent this morning.  Not very arousable.  ROS: Unable to do  Objective:  Vital Signs  Vitals:   10/06/18 0600 10/06/18 0700 10/06/18 0709 10/06/18 0800  BP: (!) 84/43 (!) 75/44 (!) 84/45 (!) 76/34  Pulse: 83 81 82 85  Resp: 18 14 20 17   Temp:      TempSrc:      SpO2: 96% 95% 96% 92%  Weight:  52 kg    Height:  5\' 2"  (1.575 m)      Intake/Output Summary (Last 24 hours) at 10/06/2018 0813 Last data filed at 10/06/2018 0507 Gross per 24 hour  Intake 3181.37 ml  Output 275 ml  Net 2906.37 ml   Filed Weights   10/06/18 0700  Weight: 52 kg    General appearance: Somnolent.  Not very arousable.  Pupils equal reacting. Head: Normocephalic, without obvious abnormality, atraumatic Resp: Normal effort noted.  Diminished air entry at the bases.  No wheezing rales or rhonchi. Cardio: regular rate and rhythm, S1, S2 normal, no murmur, click, rub or gallop GI: soft, non-tender; bowel sounds normal; no masses,  no organomegaly Extremities: Multiple dressings noted on his lower extremity for skin wounds. Neurologic: No focal deficits noted although patient is somnolent.  Lab Results:  Data Reviewed: I have personally reviewed following labs and  imaging studies  CBC: Recent Labs  Lab 10/04/2018 1651 10/06/18 0248  WBC 13.2* 9.1  NEUTROABS 10.0*  --   HGB 10.9* 8.8*  HCT 36.1* 30.5*  MCV 109.1* 112.5*  PLT 279 852    Basic Metabolic Panel: Recent Labs  Lab 10/26/2018 1651 10/10/2018 1903 10/04/2018 2255 10/06/18 0248  NA 151*  --  149* 150*  K 4.2  --  4.1 3.7  CL 114*  --  121* 121*  CO2 26  --  20* 20*  GLUCOSE 130*  --  124* 142*  BUN 66*  --  52* 49*  CREATININE 2.04*  --  1.57* 1.24  CALCIUM 8.5*  --  7.6* 7.5*  MG  --  2.5*  --  2.1  PHOS  --  5.0*  --  3.4    GFR: Estimated Creatinine Clearance: 44.3 mL/min (by C-G formula based on SCr of 1.24 mg/dL).  Liver Function Tests: Recent Labs  Lab 10/09/2018 1651 10/06/18 0248  AST 78* 57*  ALT 54* 40  ALKPHOS 101 69  BILITOT 0.4 0.5  PROT 7.1 5.3*  ALBUMIN 2.6* 1.9*     Recent Labs  Lab 10/20/2018 1651  AMMONIA 16    Coagulation Profile: Recent Labs  Lab 10/11/2018 1731  INR 1.43    Cardiac Enzymes: Recent Labs  Lab 10/28/2018 1903 10/06/2018 2106 10/06/18 0248  CKTOTAL 898*  --  728*  TROPONINI  --  <0.03  --     CBG: Recent  Labs  Lab 10/10/2018 1645  GLUCAP 125*    Thyroid Function Tests: Recent Labs    10/06/18 0248  TSH 4.303    Anemia Panel: Recent Labs    10/22/2018 1748  VITAMINB12 799  FOLATE 26.0  FERRITIN 1,686*  TIBC 124*  IRON 21*  RETICCTPCT 1.7    Recent Results (from the past 240 hour(s))  Blood Culture (routine x 2)     Status: None (Preliminary result)   Collection Time: 10/19/2018  4:50 PM  Result Value Ref Range Status   Specimen Description   Final    BLOOD RIGHT ARM Performed at [facility], [place] [address], [place], [place] [address]    Special Requests   Final    BOTTLES DRAWN AEROBIC AND ANAEROBIC Blood Culture adequate volume   Culture PENDING  Incomplete   Report Status PENDING  Incomplete  Blood Culture (routine x 2)     Status: None (Preliminary result)   Collection Time:  10/12/2018  5:06 PM  Result Value Ref Range Status   Specimen Description BLOOD BLOOD LEFT FOREARM  Final   Special Requests   Final    BOTTLES DRAWN AEROBIC AND ANAEROBIC Blood Culture adequate volume Performed at [facility] [address] [place], [place] [address]    Culture   Final    NO GROWTH < 12 HOURS Performed at [facility] [address], [place],  [address]    Report Status PENDING  Incomplete  MRSA PCR Screening     Status: None   Collection Time: 10/08/2018  8:57 PM  Result Value Ref Range Status   MRSA by PCR NEGATIVE NEGATIVE Final    Comment:        The GeneXpert MRSA Assay (FDA approved for NASAL specimens only), is one component of a comprehensive MRSA colonization surveillance program. It is not intended to diagnose MRSA infection nor to guide or monitor treatment for MRSA infections. Performed at [facility], [place] [address] [place],  [address]       Radiology Studies: Dg Abdomen 1 View  Result Date: 10/21/2018 CLINICAL DATA:  Constipation. EXAM: ABDOMEN - 1 VIEW COMPARISON:  CT 07/08/2016 FINDINGS: Air-filled normal caliber small bowel in the central and left abdomen. No evidence of obstruction or free air. Moderate stool in the ascending colon. Moderate rectosigmoid stool with mild rectal distension of 7.3 cm. Pelvic calcifications consistent with phleboliths. Right upper quadrant calcification likely corresponds to gallstone that was seen on prior CT. Lung bases are clear. Scoliotic curvature in degenerative change in the spine. IMPRESSION: 1. No evidence of obstruction or free air. 2. Stool in the rectosigmoid colon with mild rectal distension but no increased stool burden to suggest constipation. 3. Gallstone on prior CT tentatively identified. Electronically Signed   By: Keith Rake M.D.   On: 09/29/2018 19:54   Ct Head Wo Contrast  Result Date: 10/06/2018 CLINICAL DATA:  Decreased  appetite EXAM: CT HEAD WITHOUT CONTRAST TECHNIQUE: Contiguous axial images were obtained from the base of the skull through the vertex without intravenous contrast. COMPARISON:  CT 07/18/2018 FINDINGS: Brain: Motion degraded study. No acute territorial infarction, hemorrhage or intracranial mass. Stable atrophy. Stable enlarged ventricles. Periventricular white matter hypodensity. Vascular: No hyperdense vessels.  Carotid vascular calcification. Skull: Normal. Negative for fracture or focal lesion. Sinuses/Orbits: No acute finding. Other: None IMPRESSION: 1. Motion degradation limits the exam 2. No definite CT evidence for acute intracranial abnormality 3. Similar appearance  of enlarged ventricular system, atrophy, and mild hypodensity within the bilateral white matter. Electronically Signed   By: Donavan Foil M.D.   On: 10/24/2018 16:53   Dg Chest Port 1 View  Result Date: 10/01/2018 CLINICAL DATA:  Reduced appetite EXAM: PORTABLE CHEST 1 VIEW COMPARISON:  01/29/2017 FINDINGS: The patient is rotated to the right on today's radiograph, reducing diagnostic sensitivity and specificity. Atherosclerotic calcification of the aortic arch. Mild thoracic spondylosis. Accounting for the rotated chest, the lungs appear clear. Old right rib deformities compatible with prior fractures. IMPRESSION: 1.  No active cardiopulmonary disease is radiographically apparent. 2.  Aortic Atherosclerosis (ICD10-I70.0). 3. Old right rib fractures. Electronically Signed   By: Van Clines M.D.   On: 10/01/2018 18:12     Medications:  Scheduled: . allopurinol  300 mg Oral q morning - 10a  . chlorhexidine  15 mL Mouth Rinse BID  . folic acid  1 mg Intravenous Daily  . mouth rinse  15 mL Mouth Rinse BID  . phenytoin (DILANTIN) IV  100 mg Intravenous Q8H  . senna  1 tablet Oral BID  . simvastatin  20 mg Oral QPM  . thiamine injection  100 mg Intravenous Daily   Continuous: . dextrose 5 % and 0.45% NaCl Stopped (10/04/2018  2244)   IWP:[id number] **OR** acetaminophen, bisacodyl, HYDROcodone-acetaminophen    Assessment/Plan:  Acute renal failure with hyponatremia This is most likely due to poor oral intake over the past many days.  Renal function has improved with IV hydration.  Sodium remains elevated.  Monitor urine output.  Recheck labs daily.  Hypotension Apparently patient has some degree of chronic hypotension.  Old records reviewed.  Blood pressure has never been this low however.  Patient not noted to be on any antihypertensives.  Flomax will be discontinued.  No clear evidence for sepsis.  UA was clear.  Chest x-ray did not show any infiltrates.  He is afebrile.  His heart rate is normal.  Lactic acid level was mildly elevated at initial presentation but then subsequently normal last night.  We will check a cortisol level.  Recheck lactic acid level.  WBC is normal.  All this could be due to hypovolemia.  Give normal saline bolus.  Mild rhabdomyolysis CK level was noted to be elevated.  Improving with IV hydration.  Monitor urine output.  QT prolongation Avoid QT prolonging medications.  Monitor on telemetry.  Mildly abnormal LFTs Most likely in the setting of rhabdomyolysis.  Hepatitis panel is pending.  Improved this morning.  Patient with known history of cholelithiasis.  However his right upper quadrant is benign on examination.  History of seizure disorder Continue Dilantin.  If patient's mentation does not improve may have to change that to intravenous formulation.  Acute metabolic encephalopathy Patient with known history of mental retardation.  He is extremely somnolent this morning.  Apparently was awake last night.  CT head did not show any acute findings.  Continue to monitor closely.  History of mental retardation Patient has poor quality of life at baseline.  Apparently has been declining over the past several months.  Has had falls.  He is now wheelchair-bound.  Palliative medicine  consult has been requested.  Patient's legal guardian is apparently a sister who lives in's [place].  His previous legal guardian passed away recently.                  ADDENDUM: Called by the vascular lab technician that patient has an acute  DVT in the left lower extremity.  Will initiate IV heparin.  Wonder if he has had a PE as well.  However he is not tachycardic nor hypoxic.  Consider CT angiogram.  Abnormal EEG report reviewed.  Discussed with neurology, Dr. Lorraine Lax.  Patient's Dilantin level was subtherapeutic at admission.  He recommends loading him with a 750 mg of Dilantin.  Recheck another level tomorrow and consider another EEG tomorrow.                     DVT Prophylaxis: SCDs    Code Status: Full code as of now Family Communication: No family at bedside Disposition Plan: Management as outlined above.    LOS: 1 day   [facility] Pager [phone] 10/06/2018, 8:13 AM  If 7PM-7AM, please contact night-coverage at [email or web address], password [facility]

## 2018-10-07 ENCOUNTER — Inpatient Hospital Stay (HOSPITAL_COMMUNITY): Admit: 2018-10-07 | Payer: Medicare Other

## 2018-10-07 ENCOUNTER — Inpatient Hospital Stay (HOSPITAL_COMMUNITY): Payer: Medicare Other

## 2018-10-07 DIAGNOSIS — G92 Toxic encephalopathy: Secondary | ICD-10-CM

## 2018-10-07 DIAGNOSIS — E870 Hyperosmolality and hypernatremia: Secondary | ICD-10-CM

## 2018-10-07 DIAGNOSIS — I2699 Other pulmonary embolism without acute cor pulmonale: Secondary | ICD-10-CM

## 2018-10-07 DIAGNOSIS — N170 Acute kidney failure with tubular necrosis: Secondary | ICD-10-CM

## 2018-10-07 LAB — CBC
HCT: 28.2 % — ABNORMAL LOW (ref 39.0–52.0)
Hemoglobin: 8.3 g/dL — ABNORMAL LOW (ref 13.0–17.0)
MCH: 32.4 pg (ref 26.0–34.0)
MCHC: 29.4 g/dL — ABNORMAL LOW (ref 30.0–36.0)
MCV: 110.2 fL — ABNORMAL HIGH (ref 80.0–100.0)
NRBC: 0 % (ref 0.0–0.2)
Platelets: 207 10*3/uL (ref 150–400)
RBC: 2.56 MIL/uL — ABNORMAL LOW (ref 4.22–5.81)
RDW: 14.6 % (ref 11.5–15.5)
WBC: 8.2 10*3/uL (ref 4.0–10.5)

## 2018-10-07 LAB — HEPARIN LEVEL (UNFRACTIONATED)
Heparin Unfractionated: <0.1 IU/mL — ABNORMAL LOW (ref 0.30–0.70)
Heparin Unfractionated: 0.18 IU/mL — ABNORMAL LOW (ref 0.30–0.70)
Heparin Unfractionated: 0.16 IU/mL — ABNORMAL LOW (ref 0.30–0.70)

## 2018-10-07 LAB — COMPREHENSIVE METABOLIC PANEL
ALT: 40 U/L (ref 0–44)
AST: 64 U/L — ABNORMAL HIGH (ref 15–41)
Albumin: 1.6 g/dL — ABNORMAL LOW (ref 3.5–5.0)
Alkaline Phosphatase: 67 U/L (ref 38–126)
Anion gap: 6 (ref 5–15)
BUN: 22 mg/dL (ref 8–23)
CO2: 20 mmol/L — ABNORMAL LOW (ref 22–32)
Calcium: 7.1 mg/dL — ABNORMAL LOW (ref 8.9–10.3)
Chloride: 124 mmol/L — ABNORMAL HIGH (ref 98–111)
Creatinine, Ser: 0.95 mg/dL (ref 0.61–1.24)
GFR calc Af Amer: >60 mL/min (ref >60)
GFR calc non Af Amer: >60 mL/min (ref >60)
Glucose, Bld: 133 mg/dL — ABNORMAL HIGH (ref 70–99)
Potassium: 3.6 mmol/L (ref 3.5–5.1)
Sodium: 150 mmol/L — ABNORMAL HIGH (ref 135–145)
TOTAL PROTEIN: 4.7 g/dL — AB (ref 6.5–8.1)
Total Bilirubin: 0.5 mg/dL (ref 0.3–1.2)

## 2018-10-07 LAB — HEPATITIS PANEL, ACUTE
HCV Ab: <0.1 s/co ratio (ref 0.0–0.9)
Hep A IgM: NEGATIVE
Hep B C IgM: NEGATIVE
Hepatitis B Surface Ag: NEGATIVE

## 2018-10-07 LAB — PHENYTOIN LEVEL, TOTAL: Phenytoin Lvl: 11.1 ug/mL (ref 10.0–20.0)

## 2018-10-07 LAB — URINE CULTURE: Culture: NO GROWTH

## 2018-10-07 MED ORDER — HEPARIN BOLUS VIA INFUSION
1500.0000 [IU] | Freq: Once | INTRAVENOUS | Status: AC
  Administered 2018-10-07: 1500 [IU] via INTRAVENOUS
  Filled 2018-10-07: qty 1500

## 2018-10-07 MED ORDER — MAGIC MOUTHWASH W/LIDOCAINE
10.0000 mL | Freq: Four times a day (QID) | ORAL | Status: DC
  Administered 2018-10-07 – 2018-10-11 (×15): 10 mL via ORAL
  Administered 2018-10-11: 5 mL via ORAL
  Administered 2018-10-12 – 2018-10-17 (×19): 10 mL via ORAL
  Filled 2018-10-07 (×48): qty 10

## 2018-10-07 MED ORDER — SODIUM CHLORIDE 0.9 % IV BOLUS
500.0000 mL | Freq: Once | INTRAVENOUS | Status: AC
  Administered 2018-10-07: 500 mL via INTRAVENOUS

## 2018-10-07 MED ORDER — LIP MEDEX EX OINT
TOPICAL_OINTMENT | CUTANEOUS | Status: DC | PRN
  Filled 2018-10-07: qty 7

## 2018-10-07 MED ORDER — HEPARIN BOLUS VIA INFUSION
2000.0000 [IU] | Freq: Once | INTRAVENOUS | Status: AC
  Administered 2018-10-07: 2000 [IU] via INTRAVENOUS
  Filled 2018-10-07: qty 2000

## 2018-10-07 MED ORDER — DIPHENHYDRAMINE HCL 25 MG PO CAPS: 25.0000 mg | ORAL_CAPSULE | Freq: Three times a day (TID) | ORAL | Status: DC | PRN

## 2018-10-07 MED ORDER — HEPARIN BOLUS VIA INFUSION
4000.0000 [IU] | Freq: Once | INTRAVENOUS | Status: AC
  Administered 2018-10-07: 4000 [IU] via INTRAVENOUS
  Filled 2018-10-07: qty 4000

## 2018-10-07 MED ORDER — HEPARIN (PORCINE) 25000 UT/250ML-% IV SOLN
1400.0000 [IU]/h | INTRAVENOUS | Status: DC
  Administered 2018-10-07: 1400 [IU]/h via INTRAVENOUS
  Administered 2018-10-07: 1250 [IU]/h via INTRAVENOUS
  Filled 2018-10-07: qty 250

## 2018-10-07 MED ORDER — MIDODRINE HCL 5 MG PO TABS
5.0000 mg | ORAL_TABLET | Freq: Three times a day (TID) | ORAL | Status: DC
  Administered 2018-10-07 – 2018-10-08 (×2): 5 mg via ORAL
  Filled 2018-10-07 (×2): qty 1

## 2018-10-07 MED ORDER — DIPHENHYDRAMINE HCL 12.5 MG/5ML PO ELIX
12.5000 mg | ORAL_SOLUTION | Freq: Once | ORAL | Status: AC
  Administered 2018-10-07: 12.5 mg via ORAL
  Filled 2018-10-07: qty 5

## 2018-10-07 MED ORDER — DIPHENHYDRAMINE HCL 12.5 MG/5ML PO ELIX
25.0000 mg | ORAL_SOLUTION | Freq: Three times a day (TID) | ORAL | Status: DC | PRN
  Administered 2018-10-07 – 2018-10-11 (×2): 25 mg via ORAL
  Filled 2018-10-07 (×3): qty 10

## 2018-10-07 MED ORDER — HEPARIN (PORCINE) 25000 UT/250ML-% IV SOLN
1750.0000 [IU]/h | INTRAVENOUS | Status: DC
  Administered 2018-10-07 – 2018-10-08 (×2): 1750 [IU]/h via INTRAVENOUS
  Filled 2018-10-07: qty 250

## 2018-10-07 NOTE — Consult Note (Signed)
Neurology Consultation  Reason for Consult: Altered mental status, concern for seizure, failure to thrive Referring Physician: Dr. Maryland Pink  CC: Failure to thrive, poor p.o. intake  History is obtained from: Chart review, family friend at bedside  HPI: Sean Murray is a 63 y.o. male with a past medical history of Down syndrome with progressive dementing illness, CKD, hyperlipidemia, seizures, resident of adult group home requiring supervision but documented baseline from August 2019 by [facility] neurology of p.m. to feed himself dress himself and at times bathe himself, was brought into the hospital 2 days ago because of severe decreased p.o. intake.  He has been wheelchair-bound for the past 2 to 3 months in the group home that he is living at and they reported that he has not been eating and drinking for at least 3 days to a week. The family friend at bedside said that he had stopped talking about 2 months ago.  Prior to that, he would be able to have a short conversation and make his needs known but for the past 2 months that has not been the case.  He is also deteriorated in terms of his ambulation and has been in a wheelchair since then. The family friend at bedside could not tell me if he was previously sick with fevers chills cough chest pain nausea vomiting shortness of breath.  Reviewing his chart, he has been seen by [facility] neurology in August 2019 for concern for seizure-like activity versus syncope.  An EEG was done at that time that showed generalized slowing.  Plan was to follow him with clinical assessments and start antiepileptics only if clinical seizure activity was noted.  At a later point, the facility called the neurologist office with reported seizure-like activity.  Per phone conversation was described event of jerking and altered mental status.  He was then started on Dilantin 300 mg at night for presumed seizures.  In the hospital, he was worked up with labs that revealed  dehydration and evidence of rhabdomyolysis.  He has been treated for that with fluids and conservative management. EEG was done because of the altered mental status that showed generalized periodic epileptiform discharges along with generalized background slowing.  There were also noted.  The short epileptiform discharges becoming more regular at 3 to 4/s-unclear significance but unable to rule out subclinical seizure activity.  He was also found to have an acute pulmonary embolism and a lower extremity DVT during this admission for which she has been started on IV heparin.  Labs revealed a phenytoin level of 3.2 on 10/26/2018-corrected to 3.9 Neurology consultation over the phone was performed and a Dilantin load was recommended.  Dilantin load was given-750 mg IV x1 on 10/06/2018 at 8:51 PM.  Repeat Dilantin level 6 hours after the load was 11.1-corrected to 20.6 with an albumin of 1.6.  Also of note, the family friend is very concerned about his care at the group home and would like to speak with clinical social worker regarding placement at another facility where he can receive better care as they are highly unsatisfied with the care being provided at the current residence.  ROS: Unable to obtain due to altered mental status but pertinent positives to the best of history taking documented in the HPI from the family friend..   Past Medical History:  Diagnosis Date  . Abnormal EKG    HX LEFT BUNDLE BRANCH BLOCK ON 01-11-2004 EKG ON CHART, NORMAL STRESS TEST 2005  . Bladder mass  noted per ct 10/ 2017  . Cholelithiasis    one small gallstone noted per ct 10/ 2017   . CKD (chronic kidney disease), stage III (HCC)    PER PCP NOTE DR HUSAIN (last GFR 52)  --11/06/2016 CKD STAGE 3   . Down syndrome    high function  . Down's syndrome 04/13/2017  . Family history of liver cancer    both of his sister's died from liver cancer  . Foot deformity   . GERD (gastroesophageal reflux disease)   . Gout    . Gouty arthritis    last flare-up  approx. 11/ 2017  . History of Helicobacter pylori infection    followed by dr Michail Sermon [facility])  . HOH (hard of hearing)   . Hyperlipidemia   . Left bundle branch block (LBBB)    per pcp note dr Lysle Rubens LBBB  has been a known before (11-06-2016)  . Lives in independent group home pt lived at home w/ parents until deceased then his 2 sisters were his caregivers until they both decreased -- he now lives at independent group home since 2017--   [name] is name of home (independent w/ guidance, communicates well) and is monitored medically by [facility]  . Memory disorder 04/13/2017  . OA (osteoarthritis)     Family History  Problem Relation Age of Onset  . Liver cancer Sister    Social History:   reports that he has never smoked. He has never used smokeless tobacco. He reports that he does not drink alcohol or use drugs.  Medications  Current Facility-Administered Medications:  .  acetaminophen (TYLENOL) tablet 650 mg, 650 mg, Oral, Q6H PRN **OR** acetaminophen (TYLENOL) suppository 650 mg, 650 mg, Rectal, Q6H PRN, Doutova, Anastassia, MD .  allopurinol (ZYLOPRIM) tablet 300 mg, 300 mg, Oral, q morning - 10a, Doutova, Anastassia, MD .  bisacodyl (DULCOLAX) suppository 10 mg, 10 mg, Rectal, Daily PRN, Doutova, Anastassia, MD .  chlorhexidine (PERIDEX) 0.12 % solution 15 mL, 15 mL, Mouth Rinse, BID, Doutova, Anastassia, MD, 15 mL at 10/06/18 2231 .  dextrose 5 %-0.9 % sodium chloride infusion, , Intravenous, Continuous, Bonnielee Haff, MD, Last Rate: 100 mL/hr at 10/07/18 0657 .  folic acid injection 1 mg, 1 mg, Intravenous, Daily, Doutova, Anastassia, MD, 1 mg at 10/06/18 1038 .  heparin ADULT infusion 100 units/mL (25000 units/243mL sodium chloride 0.45%), 1,400 Units/hr, Intravenous, Continuous, Minda Ditto, RPH, Last Rate: 14 mL/hr at 10/07/18 0914, 1,400 Units/hr at 10/07/18 0914 .  HYDROcodone-acetaminophen (NORCO/VICODIN) 5-325 MG  per tablet 1-2 tablet, 1-2 tablet, Oral, Q4H PRN, Doutova, Anastassia, MD .  MEDLINE mouth rinse, 15 mL, Mouth Rinse, BID, Doutova, Anastassia, MD, 15 mL at 10/06/18 2231 .  mupirocin ointment (BACTROBAN) 2 %, , Topical, BID, Bonnielee Haff, MD .  phenytoin (DILANTIN) injection 100 mg, 100 mg, Intravenous, Q8H, Doutova, Anastassia, MD, 100 mg at 10/07/18 0545 .  senna (SENOKOT) tablet 8.6 mg, 1 tablet, Oral, BID, Toy Baker, MD, Stopped at 10/14/2018 2248 .  simvastatin (ZOCOR) tablet 20 mg, 20 mg, Oral, QPM, Toy Baker, MD, Stopped at 10/26/2018 2249 .  thiamine (B-1) injection 100 mg, 100 mg, Intravenous, Daily, Doutova, Anastassia, MD, 100 mg at 10/06/18 1038  Exam: Current vital signs: BP (!) 84/52   Pulse 76   Temp 99.4 F (37.4 C) (Oral)   Resp 17   Ht 5\' 2"  (1.575 m)   Wt 52 kg   SpO2 100%   BMI 20.97 kg/m  Vital signs  in last 24 hours: Temp:  [97.7 F (36.5 C)-99.4 F (37.4 C)] 99.4 F (37.4 C) (01/10 0800) Pulse Rate:  [75-88] 76 (01/10 0800) Resp:  [12-32] 17 (01/10 0800) BP: (76-109)/(36-67) 84/52 (01/10 0800) SpO2:  [89 %-100 %] 100 % (01/10 0800) General: Frail-appearing man with Down syndrome facial features lying comfortably in bed sleeping. HEENT: Normocephalic atraumatic, extremely dry oral mucous membranes with crusting in the mouth Lungs: Clear to auscultation Cardiovascular: S2 is heard regular rhythm Extremities: Warm well perfused with intact pulses. Neurological exam Patient is sleeping in bed, opens his eyes to voice.  Does not follow any commands. Makes some noises but they are not comprehensible. Cranial nerves: Pupils are equal round reactive to light, extraocular movements are unimpaired with no evidence of nystagmus although difficult to perform as he is not opening commands, blinks to threat from both sides, face appears symmetric. Motor exam: Moving upper extremities spontaneously and localizing to noxious stimulation in the lower  extremities with minimal movement in the left lower extremity and some movement in the right lower extremity. Sensory exam: Appears intact all over based on response to noxious stimulation. Did not cooperate to perform finger-nose-finger or heel-knee-shin testing.  Labs I have reviewed labs in epic and the results pertinent to this consultation are: CBC    Component Value Date/Time   WBC 8.2 10/07/2018 0253   RBC 2.56 (L) 10/07/2018 0253   HGB 8.3 (L) 10/07/2018 0253   HGB 12.6 (L) 07/12/2018 1023   HCT 28.2 (L) 10/07/2018 0253   HCT 39.3 07/12/2018 1023   PLT 207 10/07/2018 0253   PLT 230 07/12/2018 1023   MCV 110.2 (H) 10/07/2018 0253   MCV 99 (H) 07/12/2018 1023   MCH 32.4 10/07/2018 0253   MCHC 29.4 (L) 10/07/2018 0253   RDW 14.6 10/07/2018 0253   RDW 14.5 07/12/2018 1023   LYMPHSABS 1.9 10/19/2018 1651   LYMPHSABS 2.2 07/12/2018 1023   MONOABS 1.0 10/16/2018 1651   EOSABS 0.0 10/25/2018 1651   EOSABS 0.0 07/12/2018 1023   BASOSABS 0.1 10/01/2018 1651   BASOSABS 0.1 07/12/2018 1023    CMP     Component Value Date/Time   NA 150 (H) 10/07/2018 0253   NA 141 07/12/2018 1023   K 3.6 10/07/2018 0253   CL 124 (H) 10/07/2018 0253   CO2 20 (L) 10/07/2018 0253   GLUCOSE 133 (H) 10/07/2018 0253   BUN 22 10/07/2018 0253   BUN 22 07/12/2018 1023   CREATININE 0.95 10/07/2018 0253   CALCIUM 7.1 (L) 10/07/2018 0253   PROT 4.7 (L) 10/07/2018 0253   PROT 7.1 07/12/2018 1023   ALBUMIN 1.6 (L) 10/07/2018 0253   ALBUMIN 3.8 07/12/2018 1023   AST 64 (H) 10/07/2018 0253   ALT 40 10/07/2018 0253   ALKPHOS 67 10/07/2018 0253   BILITOT 0.5 10/07/2018 0253   BILITOT 0.2 07/12/2018 1023   GFRNONAA >60 10/07/2018 0253   GFRAA >60 10/07/2018 0253   Imaging I have reviewed the images obtained:  CT-scan of the brain-no acute changes.  Stable atrophy compared to prior scans.   Assessment:  63 year old man with past medical history of Down syndrome and progressive dementing  illness, CKD, hyperlipidemia, presumed seizures for which he was started on Dilantin in August now presenting with failure to thrive-like picture due to poor p.o. intake for the past few days prior to presentation, found to have a new PE for which she is on heparin, and an EEG done that shows generalized periodic  epileptiform discharges and short periodic epileptiform discharges which are very regular at 3 to 4/s of unclear significance but unable to rule out subclinical seizure activity-found to have low Dilantin level and received a Dilantin load with the current level with albumin correction slightly above the therapeutic level-20.6. I think his current presentation is a combination of multiple factors including the acute DVT/PE, progressive dementia, decreased p.o. intake, rhabdomyolysis and dehydration in the setting of baseline Down syndrome and dementia. According to the family at bedside, he is more awake today than he was yesterday but he still not following commands which at baseline although he does inconsistently. I do not feel strongly about repeating an EEG at this time but if he does not continue to show progressive improvement with the treatments provided, repeat EEG should be performed.  Impression: #Evaluate for seizure #Toxic metabolic encephalopathy #Lower extremity DVT/acute PE #Failure to thrive #Progressive dementia in the setting of baseline Down syndrome  Recommendations: -Continue with Dilantin-pharmacy consult for Dilantin level management. -Check Dilantin level and albumin tomorrow or per pharmacy -Correction of toxic metabolic derangements per primary team as you are -Management of DVT/PE per primary team as you are -Consider repeating EEG if he does not continue to improve with supportive care and continued medical management in the next couple of days. -Social work consult for placement due to dissatisfaction with the current housing situation for the  patient.  -- Amie Portland, MD [facility] Pager: [phone] If 7pm to 7am, please call on call as listed on AMION.

## 2018-10-07 NOTE — Progress Notes (Signed)
LCSW following for disposition.   Patient is from group home and may require a higher level of care at dc.   Patient has palliative consult.   LCSW will continue to follow for disposition.   Sean Murray

## 2018-10-07 NOTE — Progress Notes (Signed)
OT Cancellation Note  Patient Details Name: Sean Murray MRN: [id number] DOB: 1956/04/13   Cancelled Treatment:    Reason Eval/Treat Not Completed: Medical issues which prohibited therapy. Heparin is not therapeutic at this time.  Feel free to page me later, if levels increase and pt can be seen. Thank you.  Trace Wirick 10/07/2018, 12:59 PM  Lesle Chris, OTR/L Acute Rehabilitation Services [phone] [facility] pager [phone] office 10/07/2018

## 2018-10-07 NOTE — Progress Notes (Signed)
ANTICOAGULATION CONSULT NOTE - Initial Consult  Pharmacy Consult for heparin Indication: DVT & PE  Allergies  Allergen Reactions  . Amoxicillin Rash    Unsure wasn't listed on [facility] 01/29/17.  Severe rash Unsure wasn't listed on [facility] 01/29/17.  Severe rash  . Nsaids Other (See Comments)    Avoid NSAIDS--  Strong family hx liver cancer Avoid NSAIDS--  Strong family hx liver cancer   Patient Measurements: Height: 5\' 2"  (157.5 cm) Weight: 114 lb 10.2 oz (52 kg) IBW/kg (Calculated) : 54.6 Heparin Dosing Weight: 52kg  Vital Signs: Temp: 97.7 F (36.5 C) (01/10 0350) Temp Source: Axillary (01/10 0350) BP: 84/52 (01/10 0800) Pulse Rate: 76 (01/10 0800)  Labs: Recent Labs    10/22/2018 1651 10/21/2018 1731 10/04/2018 1903 10/07/2018 2106 10/21/2018 2255 10/06/18 0248 10/06/18 1518 10/06/18 2310 10/07/18 0253 10/07/18 0805  HGB 10.9*  --   --   --   --  8.8*  --   --  8.3*  --   HCT 36.1*  --   --   --   --  30.5*  --   --  28.2*  --   PLT 279  --   --   --   --  228  --   --  207  --   APTT  --   --   --   --   --   --  34  --   --   --   LABPROT  --  17.2*  --   --   --   --  17.4*  --   --   --   INR  --  1.43  --   --   --   --  1.44  --   --   --   HEPARINUNFRC  --   --   --   --   --   --   --  <0.10*  --  0.18*  CREATININE 2.04*  --   --   --  1.57* 1.24  --   --  0.95  --   CKTOTAL  --   --  898*  --   --  728*  --   --   --   --   TROPONINI  --   --   --  <0.03  --   --   --   --   --   --    Estimated Creatinine Clearance: 57.8 mL/min (by C-G formula based on SCr of 0.95 mg/dL).  Medical History: Past Medical History:  Diagnosis Date  . Abnormal EKG    HX LEFT BUNDLE BRANCH BLOCK ON 01-11-2004 EKG ON CHART, NORMAL STRESS TEST 2005  . Bladder mass    noted per ct 10/ 2017  . Cholelithiasis    one small gallstone noted per ct 10/ 2017   . CKD (chronic kidney disease), stage III (HCC)    PER PCP NOTE DR HUSAIN (last GFR 52)  --11/06/2016 CKD STAGE 3   . Down syndrome     high function  . Down's syndrome 04/13/2017  . Family history of liver cancer    both of his sister's died from liver cancer  . Foot deformity   . GERD (gastroesophageal reflux disease)   . Gout   . Gouty arthritis    last flare-up  approx. 11/ 2017  . History of Helicobacter pylori infection    followed by dr Michail Sermon [facility])  . HOH (hard of  hearing)   . Hyperlipidemia   . Left bundle branch block (LBBB)    per pcp note dr Lysle Rubens LBBB  has been a known before (11-06-2016)  . Lives in independent group home pt lived at home w/ parents until deceased then his 2 sisters were his caregivers until they both decreased -- he now lives at independent group home since 2017--   [name] is name of home (independent w/ guidance, communicates well) and is monitored medically by [employer]  . Memory disorder 04/13/2017  . OA (osteoarthritis)    Assessment:  63 year old male with a past medical history of mental retardation who lives in a group home who is wheelchair-bound at baseline.  Patient was brought into the hospital due to decreased oral intake for the past 3 days.   Pharmacy has been consulted to dose heparin drip for DVT, and PE. Heparin bolus 1700 units, begin infusion at 1000 units/hr Admit SCr 1.57 gm/dl  Today, 10/07/2018 1/9: 2310 Hep level = < 0.10, 1500 unit bolus, increased infusion to 1250 units/hr 1/10: 0805 Hep level = 0.18 units/ml on 1250 units/hr, SCr much improved, clearing Heparin  Goal of Therapy:  Heparin level 0.3-0.7 units/ml Monitor platelets by anticoagulation protocol   Plan:   Re-bolus Heparin 2000 units, increase rate to 1400 units/hr  Re-check Hep level in 6 hr  Daily CBC ordered, plan daily Heparin level at steady state  Minda Ditto PharmD Pager [phone] 10/07/2018, 9:11 AM

## 2018-10-07 NOTE — Progress Notes (Addendum)
  Echocardiogram 2D echo attempted but patient refusing.  Sean Murray 10/07/2018, 2:10 PM

## 2018-10-07 NOTE — Progress Notes (Signed)
[facility] PROGRESS NOTE  Sean Murray WNU:[id number] DOB: 1955/11/18 DOA: 10/17/2018  PCP: [name], MD  Brief History/Interval Summary: 63 year old male with a past medical history of mental retardation who lives in a group home who is wheelchair-bound at baseline.  Her legal guardian who is apparently his sister who lives in [place].  Patient was brought into the hospital due to decreased oral intake for the past 3 days.  Apparently patient has dementia at baseline and has been progressively getting worse over the past many months.  Reason for Visit: Acute renal failure.  Hyponatremia.  Hypotension  Consultants: Palliative medicine  Procedures:   EEG This is an abnormal electroencephalogram secondary to general background slowing and GPEDs.  Also noted are short periods when the epileptiform discharges are more regular at a rate of about 3-4 per second.  Unclear significance but can not rule out subclinical seizure activity.  Clinical correlation recommended.  Antibiotics: None  Subjective/Interval History: Patient is easily arousable this morning.  He opens his eye.  Does not really communicate.  Friends of his are at the bedside.  ROS: Unable to do due to his mental retardation  Objective:  Vital Signs  Vitals:   10/07/18 0400 10/07/18 0535 10/07/18 0600 10/07/18 0642  BP: (!) 80/53 (!) 81/50 (!) 84/52 95/67  Pulse: 82 79 78 80  Resp: 19 20 13 14   Temp:      TempSrc:      SpO2: 99% 99% 99% (!) 89%  Weight:      Height:        Intake/Output Summary (Last 24 hours) at 10/07/2018 0821 Last data filed at 10/07/2018 0657 Gross per 24 hour  Intake 3809.31 ml  Output 1875 ml  Net 1934.31 ml   Filed Weights   10/06/18 0700  Weight: 52 kg   General appearance: Somnolent but easily arousable today.  Tracks me around the room.  Mildly agitated. Resp: Clear to auscultation bilaterally.  Normal effort Cardio: S1-S2 is normal regular.  No S3-S4.   No rubs murmurs or bruit GI: Abdomen is soft.  Nontender nondistended.  Bowel sounds are present normal.  No masses organomegaly Extremities: Multiple dressings noted on his lower extremities.  Mild swelling noted. Neurologic: Much more awake alert today compared to yesterday.  Appears to be moving his upper extremities well.  He is wheelchair-bound at baseline.   Lab Results:  Data Reviewed: I have personally reviewed following labs and imaging studies  CBC: Recent Labs  Lab 10/23/2018 1651 10/06/18 0248 10/07/18 0253  WBC 13.2* 9.1 8.2  NEUTROABS 10.0*  --   --   HGB 10.9* 8.8* 8.3*  HCT 36.1* 30.5* 28.2*  MCV 109.1* 112.5* 110.2*  PLT 279 228 034    Basic Metabolic Panel: Recent Labs  Lab 10/17/2018 1651 10/16/2018 1903 10/16/2018 2255 10/06/18 0248 10/07/18 0253  NA 151*  --  149* 150* 150*  K 4.2  --  4.1 3.7 3.6  CL 114*  --  121* 121* 124*  CO2 26  --  20* 20* 20*  GLUCOSE 130*  --  124* 142* 133*  BUN 66*  --  52* 49* 22  CREATININE 2.04*  --  1.57* 1.24 0.95  CALCIUM 8.5*  --  7.6* 7.5* 7.1*  MG  --  2.5*  --  2.1  --   PHOS  --  5.0*  --  3.4  --     GFR: Estimated Creatinine Clearance: 57.8 mL/min (by C-G  formula based on SCr of 0.95 mg/dL).  Liver Function Tests: Recent Labs  Lab 10/17/2018 1651 10/06/18 0248 10/07/18 0253  AST 78* 57* 64*  ALT 54* 40 40  ALKPHOS 101 69 67  BILITOT 0.4 0.5 0.5  PROT 7.1 5.3* 4.7*  ALBUMIN 2.6* 1.9* 1.6*     Recent Labs  Lab 10/12/2018 1651  AMMONIA 16    Coagulation Profile: Recent Labs  Lab 10/08/2018 1731 10/06/18 1518  INR 1.43 1.44    Cardiac Enzymes: Recent Labs  Lab 10/26/2018 1903 10/11/2018 2106 10/06/18 0248  CKTOTAL 898*  --  728*  TROPONINI  --  <0.03  --     CBG: Recent Labs  Lab 10/07/2018 1645  GLUCAP 125*    Thyroid Function Tests: Recent Labs    10/06/18 0248  TSH 4.303    Anemia Panel: Recent Labs    10/02/2018 1748  VITAMINB12 799  FOLATE 26.0  FERRITIN 1,686*  TIBC  124*  IRON 21*  RETICCTPCT 1.7    Recent Results (from the past 240 hour(s))  Urine culture     Status: None   Collection Time: 10/24/2018  3:32 PM  Result Value Ref Range Status   Specimen Description   Final    URINE, CATHETERIZED Performed at [facility], [place] [address], [place], [place] [address]    Special Requests   Final    NONE Performed at [facility], [place] [address], [place], [place] [address]    Culture   Final    NO GROWTH Performed at [facility], [place] [address] [place], [place] [address]    Report Status 10/07/2018 FINAL  Final  Blood Culture (routine x 2)     Status: None (Preliminary result)   Collection Time: 10/28/2018  4:50 PM  Result Value Ref Range Status   Specimen Description   Final    BLOOD RIGHT ARM Performed at [place] [address] [place], [place] [address]    Special Requests   Final    BOTTLES DRAWN AEROBIC AND ANAEROBIC Blood Culture adequate volume   Culture PENDING  Incomplete   Report Status PENDING  Incomplete  Blood Culture (routine x 2)     Status: None (Preliminary result)   Collection Time: 10/15/2018  5:06 PM  Result Value Ref Range Status   Specimen Description BLOOD BLOOD LEFT FOREARM  Final   Special Requests   Final    BOTTLES DRAWN AEROBIC AND ANAEROBIC Blood Culture adequate volume Performed at [place] [address], [place], [place] [address]    Culture   Final    NO GROWTH 2 DAYS Performed at [place] [address] [place], [place] [address]    Report Status PENDING  Incomplete  MRSA PCR Screening     Status: None   Collection Time: 10/22/2018  8:57 PM  Result Value Ref Range Status   MRSA by PCR NEGATIVE NEGATIVE Final    Comment:        The GeneXpert MRSA Assay (FDA approved for NASAL specimens only), is one component of a comprehensive MRSA colonization surveillance program. It is not intended to diagnose  MRSA infection nor to guide or monitor treatment for MRSA infections. Performed at [facility], [place] [address] [place], [place] [address]       Radiology Studies: Dg Abdomen 1 View  Result Date: 10/24/2018 CLINICAL DATA:  Constipation. EXAM: ABDOMEN - 1 VIEW  COMPARISON:  CT 07/08/2016 FINDINGS: Air-filled normal caliber small bowel in the central and left abdomen. No evidence of obstruction or free air. Moderate stool in the ascending colon. Moderate rectosigmoid stool with mild rectal distension of 7.3 cm. Pelvic calcifications consistent with phleboliths. Right upper quadrant calcification likely corresponds to gallstone that was seen on prior CT. Lung bases are clear. Scoliotic curvature in degenerative change in the spine. IMPRESSION: 1. No evidence of obstruction or free air. 2. Stool in the rectosigmoid colon with mild rectal distension but no increased stool burden to suggest constipation. 3. Gallstone on prior CT tentatively identified. Electronically Signed   By: Keith Rake M.D.   On: 10/28/2018 19:54   Ct Head Wo Contrast  Result Date: 10/19/2018 CLINICAL DATA:  Decreased appetite EXAM: CT HEAD WITHOUT CONTRAST TECHNIQUE: Contiguous axial images were obtained from the base of the skull through the vertex without intravenous contrast. COMPARISON:  CT 07/18/2018 FINDINGS: Brain: Motion degraded study. No acute territorial infarction, hemorrhage or intracranial mass. Stable atrophy. Stable enlarged ventricles. Periventricular white matter hypodensity. Vascular: No hyperdense vessels.  Carotid vascular calcification. Skull: Normal. Negative for fracture or focal lesion. Sinuses/Orbits: No acute finding. Other: None IMPRESSION: 1. Motion degradation limits the exam 2. No definite CT evidence for acute intracranial abnormality 3. Similar appearance of enlarged ventricular system, atrophy, and mild hypodensity within the bilateral white matter. Electronically Signed   By:  Donavan Foil M.D.   On: 10/13/2018 16:53   Ct Angio Chest Pe W Or Wo Contrast  Result Date: 10/06/2018 CLINICAL DATA:  Left lower extremity DVT on recent vascular imaging EXAM: CT ANGIOGRAPHY CHEST WITH CONTRAST TECHNIQUE: Multidetector CT imaging of the chest was performed using the standard protocol during bolus administration of intravenous contrast. Multiplanar CT image reconstructions and MIPs were obtained to evaluate the vascular anatomy. CONTRAST:  147mL ISOVUE-370 IOPAMIDOL (ISOVUE-370) INJECTION 76% COMPARISON:  Plain film from 10/23/2018 FINDINGS: Cardiovascular: Thoracic aorta shows no evidence of aneurysmal dilatation or dissection. Cardiac shadow is at the upper limits of normal in size. Pulmonary artery is well visualize with a normal branching pattern. Filling defects are noted within the right lower lobe pulmonary arterial branches consistent with pulmonary emboli. No definitive left-sided pulmonary emboli is noted. No evidence of right heart strain is noted. Mediastinum/Nodes: The esophagus is within normal limits. No sizable hilar or mediastinal adenopathy is noted. The thoracic inlet is within normal limits. Lungs/Pleura: The lungs are well aerated bilaterally without focal infiltrate or sizable effusion. No pneumothorax is seen. Upper Abdomen: Visualized upper abdomen shows no acute abnormality. Musculoskeletal: Degenerative changes of the thoracic spine are noted. Review of the MIP images confirms the above findings. IMPRESSION: Right lower lobe pulmonary emboli without evidence of right heart strain. This corresponds with the known left lower extremity deep venous thrombosis. No other focal abnormality is seen. These results will be called to the ordering clinician or representative by the Radiologist Assistant, and communication documented in the PACS or zVision Dashboard. Electronically Signed   By: Inez Catalina M.D.   On: 10/06/2018 16:58   Dg Chest Port 1 View  Result Date:  10/01/2018 CLINICAL DATA:  Reduced appetite EXAM: PORTABLE CHEST 1 VIEW COMPARISON:  01/29/2017 FINDINGS: The patient is rotated to the right on today's radiograph, reducing diagnostic sensitivity and specificity. Atherosclerotic calcification of the aortic arch. Mild thoracic spondylosis. Accounting for the rotated chest, the lungs appear clear. Old right rib deformities compatible with prior fractures. IMPRESSION: 1.  No active cardiopulmonary disease is  radiographically apparent. 2.  Aortic Atherosclerosis (ICD10-I70.0). 3. Old right rib fractures. Electronically Signed   By: Van Clines M.D.   On: 10/04/2018 18:12   Vas [place] Lower Extremity Venous (dvt)  Result Date: 10/06/2018  Lower Venous Study Indications: Swelling.  Limitations: Body habitus and contracted state. Performing Technologist: Maudry Mayhew MHA, RDMS, RVT, RDCS  Examination Guidelines: A complete evaluation includes B-mode imaging, spectral Doppler, color Doppler, and power Doppler as needed of all accessible portions of each vessel. Bilateral testing is considered an integral part of a complete examination. Limited examinations for reoccurring indications may be performed as noted.  Right Venous Findings: +---+---------------+---------+-----------+----------+--------------+    CompressibilityPhasicitySpontaneityPropertiesSummary        +---+---------------+---------+-----------+----------+--------------+ CFV                                             Not visualized +---+---------------+---------+-----------+----------+--------------+  Left Venous Findings: +---------+---------------+---------+-----------+----------+--------------+          CompressibilityPhasicitySpontaneityPropertiesSummary        +---------+---------------+---------+-----------+----------+--------------+ CFV                                                   Not visualized  +---------+---------------+---------+-----------+----------+--------------+ SFJ                                                   Not visualized +---------+---------------+---------+-----------+----------+--------------+ FV Prox  None                    No                   Acute          +---------+---------------+---------+-----------+----------+--------------+ FV Mid   None                                         Acute          +---------+---------------+---------+-----------+----------+--------------+ FV DistalNone                                         Acute          +---------+---------------+---------+-----------+----------+--------------+ PFV                                                   Not visualized +---------+---------------+---------+-----------+----------+--------------+ POP      None                    No                   Acute          +---------+---------------+---------+-----------+----------+--------------+ PTV      Full  Yes                                 +---------+---------------+---------+-----------+----------+--------------+ PERO     Full                    Yes                                 +---------+---------------+---------+-----------+----------+--------------+ SSV      None                                         Acute          +---------+---------------+---------+-----------+----------+--------------+    Summary: Left: Findings consistent with acute deep vein thrombosis involving the left femoral vein, and left popliteal vein. Findings consistent with acute superficial vein thrombosis involving the left small saphenous vein. No cystic structure found in the popliteal fossa.  *See table(s) above for measurements and observations. Electronically signed by Quay Burow MD on 10/06/2018 at 6:07:23 PM.    Final      Medications:  Scheduled: . allopurinol  300 mg Oral q morning - 10a  .  chlorhexidine  15 mL Mouth Rinse BID  . folic acid  1 mg Intravenous Daily  . mouth rinse  15 mL Mouth Rinse BID  . mupirocin ointment   Topical BID  . phenytoin (DILANTIN) IV  100 mg Intravenous Q8H  . senna  1 tablet Oral BID  . simvastatin  20 mg Oral QPM  . thiamine injection  100 mg Intravenous Daily   Continuous: . dextrose 5 % and 0.9% NaCl 100 mL/hr at 10/07/18 0657  . heparin 1,250 Units/hr (10/07/18 0657)   NID:[id number] **OR** acetaminophen, bisacodyl, HYDROcodone-acetaminophen    Assessment/Plan:  Acute renal failure with hypernatremia It was most likely due to poor oral intake over the past many days prior to admission.  Renal function has improved with IV hydration.  Sodium level elevated but stable.  Monitor urine output.    Hypotension Apparently patient has some degree of chronic hypotension.  Old records reviewed.  Blood pressure has never been this low however.  Patient not noted to be on any antihypertensives.  Flomax was discontinued.  Lactic acid level was normal.  Cortisol level was normal.  No obvious source of infection identified.  UA was clear.  Chest x-ray did not show any infiltrates.  He remains afebrile.  Blood pressure has improved slightly.  He still requiring IV fluids.  He does have pulmonary embolism but does not significant enough to cause hypotension.  No mention of pericardial effusion.  Consider echocardiogram.  He seems to be much more responsive today however.  Continue to monitor closely.    Acute pulmonary embolism and lower extremity DVT Patient was found to have acute DVT in the lower extremity.  CT scan of the chest was done which revealed pulmonary embolism without any evidence for heart strain.  Patient started on IV heparin which will be continued.  Mild rhabdomyolysis CK level was noted to be elevated.  Improved with hydration.  Recheck tomorrow.  Monitor urine output.    QT prolongation Avoid QT prolonging medications.  Monitor  on telemetry.  Mildly abnormal LFTs Most likely in the setting of rhabdomyolysis.  Hepatitis panel  is unremarkable.  Stable.  Patient with known history of cholelithiasis.  However his right upper quadrant is benign on examination.  History of seizure disorder EEG was done yesterday which revealed epileptiform activity.  Unclear if this was clinically significant.  However patient's Dilantin level was subtherapeutic at admission.  Discussed with neurology yesterday who recommended a load with Dilantin which was given.  Dilantin level improved this morning.  Continue to monitor.  CT scan done at the time of admission did not show any acute findings.    Macrocytic anemia Hemoglobin dropped likely due to dilution.  No evidence of overt bleeding.  Anemia panel reviewed.  Ferritin 1686.  Folic acid level of 26.  Vitamin B12 level 799.  Continue to monitor.  Acute metabolic encephalopathy Patient with known history of mental retardation.  See above under seizure disorder as well.  Mentation appears to have improved slightly today.  Baseline is not known but he does not communicate much according to the friends who are at the bedside.  CT head did not show any acute findings.  History of mental retardation Patient has poor quality of life at baseline.  Apparently has been declining over the past several months.  Has had falls.  He is now wheelchair-bound.  Palliative medicine has seen the patient.  He is now DNR.  Further discussions to be held with patient's legal guardian today.                      DVT Prophylaxis: On IV heparin Code Status: DNR Family Communication: Friends are at the bedside.  No family members. Disposition Plan: Management as outlined above.  Will order echocardiogram.  Repeat EEG.    LOS: 2 days   [facility] Pager [phone] 10/07/2018, 8:21 AM  If 7PM-7AM, please contact night-coverage at [email or web address], password [facility]

## 2018-10-07 NOTE — Progress Notes (Signed)
Yell for heparin Indication: DVT & PE  Allergies  Allergen Reactions  . Amoxicillin Rash    Unsure wasn't listed on [facility] 01/29/17.  Severe rash Unsure wasn't listed on [facility] 01/29/17.  Severe rash  . Nsaids Other (See Comments)    Avoid NSAIDS--  Strong family hx liver cancer Avoid NSAIDS--  Strong family hx liver cancer   Patient Measurements: Height: 5\' 2"  (157.5 cm) Weight: 114 lb 10.2 oz (52 kg) IBW/kg (Calculated) : 54.6 Heparin Dosing Weight: 52kg  Vital Signs: Temp: 100.3 F (37.9 C) (01/10 1200) Temp Source: Oral (01/10 1200) BP: 91/58 (01/10 1600) Pulse Rate: 140 (01/10 1500)  Labs: Recent Labs    10/21/2018 1651 09/28/2018 1731 10/04/2018 1903 10/13/2018 2106 10/06/2018 2255 10/06/18 0248 10/06/18 1518 10/06/18 2310 10/07/18 0253 10/07/18 0805 10/07/18 1547  HGB 10.9*  --   --   --   --  8.8*  --   --  8.3*  --   --   HCT 36.1*  --   --   --   --  30.5*  --   --  28.2*  --   --   PLT 279  --   --   --   --  228  --   --  207  --   --   APTT  --   --   --   --   --   --  34  --   --   --   --   LABPROT  --  17.2*  --   --   --   --  17.4*  --   --   --   --   INR  --  1.43  --   --   --   --  1.44  --   --   --   --   HEPARINUNFRC  --   --   --   --   --   --   --  <0.10*  --  0.18* 0.16*  CREATININE 2.04*  --   --   --  1.57* 1.24  --   --  0.95  --   --   CKTOTAL  --   --  898*  --   --  728*  --   --   --   --   --   TROPONINI  --   --   --  <0.03  --   --   --   --   --   --   --    Estimated Creatinine Clearance: 57.8 mL/min (by C-G formula based on SCr of 0.95 mg/dL).   Assessment:  63 year old male with a past medical history of mental retardation who lives in a group home who is wheelchair-bound at baseline.  Patient was brought into the hospital due to decreased oral intake for the past 3 days.   Pharmacy has been consulted to dose heparin drip for DVT and PE.   Baseline INR borderline elevated, aPTT WNL  Prior  anticoagulation: none  Significant events:  Today, 10/07/2018:  CBC: Hgb lower; Plt lower but WNL  Most recent heparin level SUBtherapeutic and lower than previous on 14 units/hr despite bolus and rate increase  No bleeding or infusion issues per nursing (RN did note IV line dressing was particularly tight above infusion site earlier today with lower arm swelling, but this has been resolved > 4 hr)   Goal  of Therapy: Heparin level 0.3-0.7 units/ml Monitor platelets by anticoagulation protocol: Yes  Plan:  Heparin 4000 units IV bolus x 1  Increase heparin IV infusion to 1750 units/hr  Check heparin level 6 hrs after rate change  Daily CBC, daily heparin level once stable  Monitor for signs of bleeding or thrombosis   Reuel Boom, PharmD, BCPS [phone] 10/07/2018, 5:00 PM

## 2018-10-07 NOTE — Progress Notes (Addendum)
Pharmacy: [facility]  Patient's a 63 y.o M currently on heparin drip for acute LE DVT and PE. Heparin level is therapeutic at 0.61 (goal 0.3-0.7) with current rate of 1750 units/hr.  Plan: - continue heparin drip at 1750 units/hr - f/u with AM labs and adjust dose if needed - monitor for s/s bleeding  Dia Sitter, PharmD, BCPS 10/08/2018 12:38 AM ___________________________________  Adden: This morning's heparin level is supra-therapeutic at 0.77. Will decrease rate to 1650 units/hr and check a 6 hr heparin level after changed in rate.  Dia Sitter, PharmD, BCPS 10/08/2018 6:09 AM

## 2018-10-07 NOTE — Progress Notes (Signed)
PT Cancellation Note  Patient Details Name: Fed Ceci MRN: [id number] DOB: Feb 06, 1956   Cancelled Treatment:    Reason Eval/Treat Not Completed: Medical issues which prohibited therapy, Heparin level not therapeutic  X 24 hours per PT clinical  Guidelines for PE. Feel free to notify PT later if able to proceed with evaluation.    Claretha Cooper 10/07/2018, 1:29 PM  [place] Pager [phone] Office [phone]

## 2018-10-07 NOTE — Evaluation (Signed)
Clinical/Bedside Swallow Evaluation Patient Details  Name: Sean Murray MRN: [id number] Date of Birth: 10/01/1955  Today's Date: 10/07/2018 Time: SLP Start Time (ACUTE ONLY): 1015 SLP Stop Time (ACUTE ONLY): 1115 SLP Time Calculation (min) (ACUTE ONLY): 60 min  Past Medical History:  Past Medical History:  Diagnosis Date  . Abnormal EKG    HX LEFT BUNDLE BRANCH BLOCK ON 01-11-2004 EKG ON CHART, NORMAL STRESS TEST 2005  . Bladder mass    noted per ct 10/ 2017  . Cholelithiasis    one small gallstone noted per ct 10/ 2017   . CKD (chronic kidney disease), stage III (HCC)    PER PCP NOTE DR HUSAIN (last GFR 52)  --11/06/2016 CKD STAGE 3   . Down syndrome    high function  . Down's syndrome 04/13/2017  . Family history of liver cancer    both of his sister's died from liver cancer  . Foot deformity   . GERD (gastroesophageal reflux disease)   . Gout   . Gouty arthritis    last flare-up  approx. 11/ 2017  . History of Helicobacter pylori infection    followed by dr Michail Sermon [facility])  . HOH (hard of hearing)   . Hyperlipidemia   . Left bundle branch block (LBBB)    per pcp note dr Lysle Rubens LBBB  has been a known before (11-06-2016)  . Lives in independent group home pt lived at home w/ parents until deceased then his 2 sisters were his caregivers until they both decreased -- he now lives at independent group home since 2017--   [name] is name of home (independent w/ guidance, communicates well) and is monitored medically by [facility]  . Memory disorder 04/13/2017  . OA (osteoarthritis)    Past Surgical History:  Past Surgical History:  Procedure Laterality Date  . COLONOSCOPY  03/13/2010  . CYSTOSCOPY W/ RETROGRADES Bilateral 11/09/2016   Procedure: CYSTOSCOPY WITH RETROGRADE PYELOGRAM;  Surgeon: Cleon Gustin, MD;  Location: [facility];  Service: Urology;  Laterality: Bilateral;  . CYSTOSCOPY WITH BIOPSY N/A 11/09/2016   Procedure: CYSTOSCOPY WITH   BLADDER BIOPSY, FULGERATION;  Surgeon: Cleon Gustin, MD;  Location: [facility];  Service: Urology;  Laterality: N/A;  . ESOPHAGOGASTRODUODENOSCOPY (EGD) WITH PROPOFOL N/A 12/23/2016   Procedure: ESOPHAGOGASTRODUODENOSCOPY (EGD) WITH PROPOFOL;  Surgeon: Wilford Corner, MD;  Location: [facility];  Service: Endoscopy;  Laterality: N/A;   HPI:  63 y.o. male admitted on 10/10/2018 with no PO intake for 3 days at his group home [facility]).  He was found to have acute renal failure, hypernatremia, mild rhabdomyolysis, and elevated LFTs.  PMH: Down Syndrome, LBBB, CKD 3 gouty arthritis, bladder mass (2017), MR, progressive dementia and hearing loss. CXR - no active cardiopulmonary disease.   Assessment / Plan / Recommendation Clinical Impression  RN expressed concern about the unfortunate state of pt's oral cavity. Upon arrival of SLP, pt was mouth breathing, and his oral cavity was severely dry and caked with thick dry secretions. Gentle but thorough oral care was completed using suction with nursing assistance, resulting in significant improvement in cleanliness and presentation of oral cavity.  Following oral care, pt accepted trials of ice chips, thin liquid, nectar thick liquid, and puree consistencies. Discoordinated oral prep was evident, however, no residue was noted after the swallow. Excellent laryngeal elevation noted to palpation, and no overt s/s aspiration observed on any consistency. Will begin puree diet and thin liquids, recommending crushed meds and thorough  oral care QID. Safe swallow precautions reviewed with visitors and posted at [facility]. SLP will follow for diet tolerance and education.    SLP Visit Diagnosis: Dysphagia, unspecified (R13.10)    Aspiration Risk  Mild aspiration risk    Diet Recommendation Thin liquid;Dysphagia 1 (Puree)   Liquid Administration via: Straw Medication Administration: Crushed with puree Supervision: Full supervision/cueing  for compensatory strategies Compensations: Minimize environmental distractions;Slow rate;Small sips/bites Postural Changes: Seated upright at 90 degrees    Other  Recommendations Oral Care Recommendations: Oral care QID Other Recommendations: Have oral suction available   Follow up Recommendations 24 hour supervision/assistance      Frequency and Duration min 1 x/week  1 week       Prognosis Prognosis for Safe Diet Advancement: Fair Barriers to Reach Goals: Cognitive deficits      Swallow Study   General Date of Onset: 10/01/2018 HPI: 63 y.o. male admitted on 10/04/2018 with no PO intake for 3 days at his group home [employer] [facility]).  He was found to have acute renal failure, hypernatremia, mild rhabdomyolysis, and elevated LFTs.  PMH: Down Syndrome, LBBB, CKD 3 gouty arthritis, bladder mass (2017), MR, progressive dementia and hearing loss. CXR - no active cardiopulmonary disease. Type of Study: Bedside Swallow Evaluation Previous Swallow Assessment: none Diet Prior to this Study: NPO Temperature Spikes Noted: No Respiratory Status: Room air History of Recent Intubation: No Behavior/Cognition: Alert;Cooperative;Pleasant mood Oral Cavity Assessment: Dried secretions;Dry;Lesions Oral Care Completed by SLP: Yes Oral Cavity - Dentition: Missing dentition;Poor condition Vision: Impaired for self-feeding Self-Feeding Abilities: Total assist Patient Positioning: Upright in bed Baseline Vocal Quality: Low vocal intensity Volitional Cough: Cognitively unable to elicit Volitional Swallow: Unable to elicit    Oral/Motor/Sensory Function Overall Oral Motor/Sensory Function: Generalized oral weakness   Ice Chips Ice chips: Within functional limits   Thin Liquid Thin Liquid: Within functional limits Presentation: Straw    Nectar Thick Nectar Thick Liquid: Within functional limits Presentation: Straw   Honey Thick Honey Thick Liquid: Not tested   Puree Puree: Within functional  limits Presentation: Spoon   Solid     Solid: Not tested     Celinda Dethlefs B. Quentin Ore, [facility], [place] Speech Language Pathologist [phone]  Shonna Chock 10/07/2018,11:40 AM

## 2018-10-07 NOTE — Progress Notes (Signed)
   10/07/18 1500  Clinical Encounter Type  Visited With Patient and family together  Visit Type Initial;Psychological support;Spiritual support;Critical Care  Referral From Nurse  Consult/Referral To Chaplain  Spiritual Encounters  Spiritual Needs Prayer;Emotional;Other (Comment) (Spiritual Care Conversation/Support)  Stress Factors  Patient Stress Factors Not reviewed  Family Stress Factors Health changes   I visited with the patient per spiritual care consult. Sean Murray was asleep upon my visit. He had many relatives around his bedside supporting him. I provided him with a prayer shawl and neck pillow. The family requested prayer and prayed around the bedside.   Please, contact Spiritual Care for further assistance.   Chaplain Shanon Ace M.Div., [facility]

## 2018-10-07 NOTE — Progress Notes (Signed)
Daily Progress Note   Patient Name: Sean Murray       Date: 10/07/2018 DOB: [birth year]  Age: 63 y.o. MRN#: [id number] Attending Physician: Bonnielee Haff, MD Primary Care Physician: [name], MD Admit Date: 10/06/2018  Reason for Consultation/Follow-up: Establishing goals of care  Subjective: Examined patient at bedside, family friends, his legal guardian Fenton Malling) and his other siblings are in the room.  Per SLP notes and RN report patient's mouth was deeply coated with dried secretions.  In cleaning his mouth they noted ulcerations in the back of his mouth and sides of his throat.    I spoke privately with Fenton Malling and the patient's other two siblings.  We discussed [name]'s very poor nutritional status, his renal failure, and abnormal electrolytes.  In general we discussed that 4 is likely considered elderly for a downs syndrome patient.  [name] used to have a very hardy appetite. He has gone from an XL to a medium during his time at the group home. He needs much more assistance with ADLs than the group home can provide.    [name] did well eating pudding and jello after his SLP evaluation.  If eating well becomes a trend then we will likely look for a SNF in the next few days.  If he does not eat well - we will talk further about hospice.    I suggested that it may be beneficial for [name] to be placed close to his Legal Guardian and sister Fenton Malling.  The family debated this for awhile but finally seemed to agree.    [name] suggested 4 places near here - I committed to give these names to social work.  [name] has [employer] and will need a medicaid bed.   Assessment: Down's syndrome with progressive dementia Wheel chair / bed bound Non-verbal Protein calorie  malnutrition Multiple excoriations and wounds. Acute DVT Anemia Ulcerations in his mouth/throat Recent hx of seizure.  Now on dilantin Hypotension  Patient Profile/HPI:   63 y.o. male  with past medical history of Downs Syndrome, LBBB, CKD 3 gouty arthritis, bladder mass (2017), MR, progressive dementia and hearing loss who was admitted on 09/28/2018 with no PO intake for 3 days at his group home [employer] [facility]).  He was found to have acute renal failure, hypernatremia, mild rhabdomyolysis, and elevated  LFTs.    At baseline [name] is wheel chair bound, and non verbal, but interactive.  He needs feeding assistance.    Length of Stay: 2  Current Medications: Scheduled Meds:  . allopurinol  300 mg Oral q morning - 10a  . chlorhexidine  15 mL Mouth Rinse BID  . folic acid  1 mg Intravenous Daily  . magic mouthwash w/lidocaine  10 mL Oral QID  . mouth rinse  15 mL Mouth Rinse BID  . mupirocin ointment   Topical BID  . phenytoin (DILANTIN) IV  100 mg Intravenous Q8H  . senna  1 tablet Oral BID  . simvastatin  20 mg Oral QPM  . thiamine injection  100 mg Intravenous Daily    Continuous Infusions: . dextrose 5 % and 0.9% NaCl 100 mL/hr at 10/07/18 0657  . heparin 1,400 Units/hr (10/07/18 0914)    PRN Meds: acetaminophen **OR** acetaminophen, bisacodyl, HYDROcodone-acetaminophen, lip balm  Physical Exam        Lethargic appearing male with downs syndrome in fetal position.  Opens eyes. CV rrr resp no distress  Vital Signs: BP 104/82   Pulse 76   Temp 99 F (37.2 C)   Resp 19   Ht 5\' 2"  (1.575 m)   Wt 52 kg   SpO2 98%   BMI 20.97 kg/m  SpO2: SpO2: 98 % O2 Device: O2 Device: Room Air O2 Flow Rate:    Intake/output summary:   Intake/Output Summary (Last 24 hours) at 10/07/2018 1303 Last data filed at 10/07/2018 5573 Gross per 24 hour  Intake 2359.8 ml  Output 2225 ml  Net 134.8 ml   LBM: Last BM Date: 10/06/18 Baseline Weight: Weight: 52 kg Most recent  weight: Weight: 52 kg       Palliative Assessment/Data: 30%    Flowsheet Rows     Most Recent Value  Intake Tab  Referral Department  Hospitalist  Unit at Time of Referral  ER  Date Notified  10/06/18  Palliative Care Type  New Palliative care  Reason for referral  Clarify Goals of Care  Date of Admission  09/29/2018  # of days IP prior to Palliative referral  1  Clinical Assessment  Psychosocial & Spiritual Assessment  Palliative Care Outcomes      Patient Active Problem List   Diagnosis Date Noted  . Pressure injury of skin 10/06/2018  . DNR (do not resuscitate)   . Dementia associated with other underlying disease without behavioral disturbance ([place])   . Palliative care encounter   . Acute renal failure (ARF) ([place]) 10/20/2018  . Dehydration 10/20/2018  . QT prolongation 10/28/2018  . Hypernatremia 10/28/2018  . Down's syndrome 04/13/2017  . Memory disorder 04/13/2017  . HOH (hard of hearing) 04/13/2017  . Generalized abdominal pain 12/23/2016  . Abnormal findings-gastrointestinal tract 12/23/2016    Palliative Care Plan    Recommendations/Plan:  ?Ulcerations.  Started magic mouthwash with nystatin.  Esophagitis? Could have contributed to decrease PO intake?  If patient continues to improve family would like him placed in SNF near Legal Guardian (Medicaid bed).  Preferences (1) [facility] in [place] (2) [facility] on [place], (3) [facility] in [place]; (4) [facility]  If patient does not improve and has poor PO intake he is eligible for hospice services either at long term care (he can have both hospice and LT care with Medicaid) or at [facility].  Family would be agreeable to this if his prognosis is  very poor.  Goals of Care and Additional Recommendations:  Limitations on Scope of Treatment: Full Scope Treatment  Code Status:  DNR  Prognosis:   Unable to determine.  If he does not eat well his prognosis is weeks.   If his PO intake is good he likely has months to perhaps years.   Discharge Planning:  To Be Determined.  SNF in [place] or [place].  Palliative Care to follow.   Care plan was discussed with family.  Thank you for allowing the Palliative Medicine Team to assist in the care of this patient.  Total time spent:  35 min.     Greater than 50%  of this time was spent counseling and coordinating care related to the above assessment and plan.  Florentina Jenny, PA-C Palliative Medicine  Please contact Palliative MedicineTeam phone at [phone] for questions and concerns between 7 am - 7 pm.   Please see AMION for individual provider pager numbers.

## 2018-10-07 NOTE — Progress Notes (Signed)
Pt BP 76/39 (51). [place], NP paged and ordered 500cc bolus. Will continue to monitor.

## 2018-10-08 LAB — ECHOCARDIOGRAM COMPLETE
Height: 62 in
Weight: 1834.23 oz

## 2018-10-08 LAB — COMPREHENSIVE METABOLIC PANEL
ALT: 46 U/L — ABNORMAL HIGH (ref 0–44)
AST: 79 U/L — AB (ref 15–41)
Albumin: 1.6 g/dL — ABNORMAL LOW (ref 3.5–5.0)
Alkaline Phosphatase: 103 U/L (ref 38–126)
Anion gap: 5 (ref 5–15)
BUN: 14 mg/dL (ref 8–23)
CHLORIDE: 120 mmol/L — AB (ref 98–111)
CO2: 22 mmol/L (ref 22–32)
Calcium: 6.9 mg/dL — ABNORMAL LOW (ref 8.9–10.3)
Creatinine, Ser: 1.02 mg/dL (ref 0.61–1.24)
GFR calc Af Amer: >60 mL/min (ref >60)
GFR calc non Af Amer: >60 mL/min (ref >60)
Glucose, Bld: 155 mg/dL — ABNORMAL HIGH (ref 70–99)
POTASSIUM: 3.1 mmol/L — AB (ref 3.5–5.1)
Sodium: 147 mmol/L — ABNORMAL HIGH (ref 135–145)
Total Bilirubin: 0.4 mg/dL (ref 0.3–1.2)
Total Protein: 4.6 g/dL — ABNORMAL LOW (ref 6.5–8.1)

## 2018-10-08 LAB — HEPARIN LEVEL (UNFRACTIONATED)
Heparin Unfractionated: 0.61 IU/mL (ref 0.30–0.70)
Heparin Unfractionated: 0.65 IU/mL (ref 0.30–0.70)
Heparin Unfractionated: 0.72 IU/mL — ABNORMAL HIGH (ref 0.30–0.70)
Heparin Unfractionated: 0.77 IU/mL — ABNORMAL HIGH (ref 0.30–0.70)

## 2018-10-08 LAB — CBC
HCT: 27.8 % — ABNORMAL LOW (ref 39.0–52.0)
Hemoglobin: 8.4 g/dL — ABNORMAL LOW (ref 13.0–17.0)
MCH: 32.7 pg (ref 26.0–34.0)
MCHC: 30.2 g/dL (ref 30.0–36.0)
MCV: 108.2 fL — ABNORMAL HIGH (ref 80.0–100.0)
Platelets: 190 10*3/uL (ref 150–400)
RBC: 2.57 MIL/uL — ABNORMAL LOW (ref 4.22–5.81)
RDW: 14.6 % (ref 11.5–15.5)
WBC: 7 10*3/uL (ref 4.0–10.5)
nRBC: 0 % (ref 0.0–0.2)

## 2018-10-08 LAB — CK: Total CK: 528 U/L — ABNORMAL HIGH (ref 49–397)

## 2018-10-08 MED ORDER — MIDODRINE HCL 5 MG PO TABS
5.0000 mg | ORAL_TABLET | Freq: Once | ORAL | Status: AC
  Administered 2018-10-08: 5 mg via ORAL
  Filled 2018-10-08: qty 1

## 2018-10-08 MED ORDER — KETOROLAC TROMETHAMINE 30 MG/ML IJ SOLN
30.0000 mg | Freq: Once | INTRAMUSCULAR | Status: AC
  Administered 2018-10-08: 30 mg via INTRAVENOUS
  Filled 2018-10-08: qty 1

## 2018-10-08 MED ORDER — SODIUM CHLORIDE 0.9 % IV BOLUS
500.0000 mL | Freq: Once | INTRAVENOUS | Status: AC
  Administered 2018-10-08: 500 mL via INTRAVENOUS

## 2018-10-08 MED ORDER — MIDODRINE HCL 5 MG PO TABS
10.0000 mg | ORAL_TABLET | Freq: Three times a day (TID) | ORAL | Status: DC
  Administered 2018-10-08 – 2018-10-14 (×17): 10 mg via ORAL
  Filled 2018-10-08 (×20): qty 2

## 2018-10-08 MED ORDER — POTASSIUM CHLORIDE 10 MEQ/100ML IV SOLN
10.0000 meq | INTRAVENOUS | Status: AC
  Administered 2018-10-08 (×4): 10 meq via INTRAVENOUS
  Filled 2018-10-08 (×4): qty 100

## 2018-10-08 MED ORDER — HEPARIN (PORCINE) 25000 UT/250ML-% IV SOLN
1550.0000 [IU]/h | INTRAVENOUS | Status: DC
  Administered 2018-10-08: 1650 [IU]/h via INTRAVENOUS
  Administered 2018-10-08 – 2018-10-12 (×6): 1550 [IU]/h via INTRAVENOUS
  Filled 2018-10-08 (×8): qty 250

## 2018-10-08 NOTE — NC FL2 (Signed)
[place] LEVEL OF CARE SCREENING TOOL     IDENTIFICATION  Patient Name: Sean Murray Birthdate: [birth year] Sex: male Admission Date (Current Location): 10/16/2018  [facility] and [place] Number:  Herbalist and Address:  [facility],  [place] [address] [place]      Provider Number: [id number]  Attending Physician Name and Address:  Bonnielee Haff, MD  Relative Name and Phone Number:       Current Level of Care: Hospital Recommended Level of Care: [place] Prior Approval Number:    Date Approved/Denied:   PASRR Number: pending  Discharge Plan: SNF    Current Diagnoses: Patient Active Problem List   Diagnosis Date Noted  . Pressure injury of skin 10/06/2018  . DNR (do not resuscitate)   . Dementia associated with other underlying disease without behavioral disturbance ([place])   . Palliative care encounter   . Acute renal failure (ARF) ([place]) 10/01/2018  . Dehydration 10/26/2018  . QT prolongation 10/08/2018  . Hypernatremia 09/29/2018  . Down's syndrome 04/13/2017  . Memory disorder 04/13/2017  . HOH (hard of hearing) 04/13/2017  . Generalized abdominal pain 12/23/2016  . Abnormal findings-gastrointestinal tract 12/23/2016    Orientation RESPIRATION BLADDER Height & Weight     Self  Normal Incontinent Weight: 114 lb 10.2 oz (52 kg) Height:  5\' 2"  (157.5 cm)  BEHAVIORAL SYMPTOMS/MOOD NEUROLOGICAL BOWEL NUTRITION STATUS      Incontinent Diet(see dc summary)  AMBULATORY STATUS COMMUNICATION OF NEEDS Skin   Total Care Verbally Other (Comment)(Right Knee unstageable, Left Foot unstageable, Pretibital Left Proximal unstageable, Left leg, Ischialtuberosity left deep tissue injury, Anterior left deep tissue injury)                       Personal Care Assistance Level of Assistance  Bathing, Feeding, Dressing Bathing Assistance: Maximum assistance Feeding assistance: Maximum assistance Dressing Assistance:  Maximum assistance     Functional Limitations Info  Sight, Hearing, Speech Sight Info: Adequate Hearing Info: Impaired Speech Info: Adequate    SPECIAL CARE FACTORS FREQUENCY                       Contractures Contractures Info: Not present    Additional Factors Info  Code Status, Allergies Code Status Info: DNR Allergies Info: Amoxicillin, Nsaids           Current Medications (10/08/2018):  This is the current hospital active medication list Current Facility-Administered Medications  Medication Dose Route Frequency Provider Last Rate Last Dose  . acetaminophen (TYLENOL) tablet 650 mg  650 mg Oral Q6H PRN Toy Baker, MD   650 mg at 10/08/18 1016   Or  . acetaminophen (TYLENOL) suppository 650 mg  650 mg Rectal Q6H PRN Toy Baker, MD   650 mg at 10/08/18 0406  . allopurinol (ZYLOPRIM) tablet 300 mg  300 mg Oral q morning - 10a Doutova, Anastassia, MD   300 mg at 10/08/18 1015  . bisacodyl (DULCOLAX) suppository 10 mg  10 mg Rectal Daily PRN Doutova, Anastassia, MD      . chlorhexidine (PERIDEX) 0.12 % solution 15 mL  15 mL Mouth Rinse BID Doutova, Anastassia, MD   15 mL at 10/08/18 1016  . dextrose 5 %-0.9 % sodium chloride infusion   Intravenous Continuous Bonnielee Haff, MD 100 mL/hr at 10/08/18 1428    . diphenhydrAMINE (BENADRYL) 12.5 MG/5ML elixir 25 mg  25 mg Oral Q8H PRN Wofford, Cindie Laroche,  RPH   25 mg at 10/07/18 2108  . folic acid injection 1 mg  1 mg Intravenous Daily Doutova, Anastassia, MD   1 mg at 10/08/18 1023  . heparin ADULT infusion 100 units/mL (25000 units/237mL sodium chloride 0.45%)  1,650 Units/hr Intravenous Continuous Lynelle Doctor, RPH 16.5 mL/hr at 10/08/18 0615 1,650 Units/hr at 10/08/18 0615  . HYDROcodone-acetaminophen (NORCO/VICODIN) 5-325 MG per tablet 1-2 tablet  1-2 tablet Oral Q4H PRN Toy Baker, MD      . magic mouthwash w/lidocaine  10 mL Oral QID Dellinger, Marianne L, PA-C   10 mL at 10/08/18 1015  . MEDLINE  mouth rinse  15 mL Mouth Rinse BID Doutova, Anastassia, MD   15 mL at 10/08/18 1017  . midodrine (PROAMATINE) tablet 10 mg  10 mg Oral TID WC Bonnielee Haff, MD   10 mg at 10/08/18 1429  . mupirocin ointment (BACTROBAN) 2 %   Topical BID Bonnielee Haff, MD      . phenytoin (DILANTIN) injection 100 mg  100 mg Intravenous Q8H Doutova, Anastassia, MD   100 mg at 10/08/18 1430  . senna (SENOKOT) tablet 8.6 mg  1 tablet Oral BID Doutova, Anastassia, MD   8.6 mg at 10/08/18 1016  . simvastatin (ZOCOR) tablet 20 mg  20 mg Oral QPM Doutova, Anastassia, MD   20 mg at 10/07/18 1737  . thiamine (B-1) injection 100 mg  100 mg Intravenous Daily Doutova, Anastassia, MD   100 mg at 10/08/18 1015     Discharge Medications: Please see discharge summary for a list of discharge medications.  Relevant Imaging Results:  Relevant Lab Results:   Additional Information ssn: [id number]  Servando Snare, LCSW

## 2018-10-08 NOTE — Evaluation (Addendum)
Occupational Therapy Evaluation Patient Details Name: Sean Murray MRN: [id number] DOB: 08/08/1956 Today's Date: 10/08/2018    History of Present Illness pt was admitted with acute rental failure/dehydration. Has DVT/PE.   H/O MR, seizures   Clinical Impression   This 63 year old man was admitted for the above. He is from a group home. RN spoke to [facility] who is not local and pt has had decline in functional status.  Friends came in and gave conflicting information about his being OOB.  Pt needs total A at this time for adls and mobility. He was minimally interactive and did not follow commands. Will defer further intervention to next venue of care.    Follow Up Recommendations  (defer further intervention to next venue) ?  Back to group home vs snf    Equipment Recommendations  None recommended by OT    Recommendations for Other Services       Precautions / Restrictions Precautions Precautions: Fall Restrictions Weight Bearing Restrictions: No      Mobility Bed Mobility               General bed mobility comments: total A +2 for supine <> sit  Transfers                 General transfer comment: unable    Balance Overall balance assessment: Needs assistance   Sitting balance-Leahy Scale: Poor Sitting balance - Comments: min to mod A for balance  once assisted to EOB; returned to supine due to BP                                   ADL either performed or assessed with clinical judgement   ADL Overall ADL's : Needs assistance/impaired                                       General ADL Comments: total A for all adls; pt did not move arms at time of evaluation. He is in mittens and can move them actively     Vision         Perception     Praxis      Pertinent Vitals/Pain Pain Assessment: Faces Faces Pain Scale: Hurts even more Pain Location: LE when moved out of full flexion first time Pain Descriptors / Indicators:  Moaning Pain Intervention(s): Limited activity within patient's tolerance;Monitored during session;Repositioned     Hand Dominance     Extremity/Trunk Assessment Upper Extremity Assessment Upper Extremity Assessment: Difficult to assess due to impaired cognition(L forearm elbow with edema)           Communication Communication Communication: (spoke briefly, hard to understand)   Cognition Arousal/Alertness: Lethargic Behavior During Therapy: WFL for tasks assessed/performed Overall Cognitive Status: No family/caregiver present to determine baseline cognitive functioning                                 General Comments: MR at baseline, did not follow commands   General Comments  supine BP 103/48; sitting 83/47    Exercises     Shoulder Instructions      Home Living Family/patient expects to be discharged to:: Group home  Prior Functioning/Environment Level of Independence: Needs assistance        Comments: PLOF unknown.  RN spoke to [facility] who is not local, and didn't get functional status information.  Friends came at the end of the session; they were not clear about what pt was doing either--stated up to couch but that he didn't get OOB        OT Problem List:        OT Treatment/Interventions:      OT Goals(Current goals can be found in the care plan section) Acute Rehab OT Goals Patient Stated Goal: unable to state  OT Frequency:     Barriers to D/C:            Co-evaluation              AM-PAC OT "6 Clicks" Daily Activity     Outcome Measure Help from another person eating meals?: Total Help from another person taking care of personal grooming?: Total Help from another person toileting, which includes using toliet, bedpan, or urinal?: Total Help from another person bathing (including washing, rinsing, drying)?: Total Help from another person to put on and taking off regular  upper body clothing?: Total Help from another person to put on and taking off regular lower body clothing?: Total 6 Click Score: 6   End of Session    Activity Tolerance: Treatment limited secondary to medical complications (Comment)(BP) Patient left: in bed;with call bell/phone within reach  OT Visit Diagnosis: Muscle weakness (generalized) (M62.81)                Time: 7867-6720 OT Time Calculation (min): 18 min Charges:  OT General Charges $OT Visit: 1 Visit OT Evaluation $OT Eval Low Complexity: 1 Low  Lesle Chris, OTR/L Acute Rehabilitation Services [phone] [facility] pager [phone] office 10/08/2018  [place] 10/08/2018, 1:24 PM

## 2018-10-08 NOTE — Progress Notes (Signed)
[facility] PROGRESS NOTE  Ziere Docken NOB:[id number] DOB: 22-Jun-1956 DOA: 09/28/2018  PCP: [name], MD  Brief History/Interval Summary: 63 year old male with a past medical history of mental retardation who lives in a group home who is wheelchair-bound at baseline.  Her legal guardian who is apparently his sister who lives in [place].  Patient was brought into the hospital due to decreased oral intake for the past 3 days.  Apparently patient has dementia at baseline and has been progressively getting worse over the past many months.  Reason for Visit: Acute renal failure.  Hyponatremia.  Hypotension  Consultants: Palliative medicine  Procedures:   EEG This is an abnormal electroencephalogram secondary to general background slowing and GPEDs.  Also noted are short periods when the epileptiform discharges are more regular at a rate of about 3-4 per second.  Unclear significance but can not rule out subclinical seizure activity.  Clinical correlation recommended.  Antibiotics: None  Subjective/Interval History: Patient is noted to be awake and alert this morning.  Does not really communicate due to his underlying mental retardation.  No family or friends at bedside today.    ROS: Unable to do due to his mental retardation  Objective:  Vital Signs  Vitals:   10/08/18 0700 10/08/18 0800 10/08/18 0828 10/08/18 1012  BP: (!) 68/52  (!) 96/57 (!) 103/48  Pulse: 84  79 85  Resp: (!) 22  12 (!) 24  Temp:  98.1 F (36.7 C)    TempSrc:  Oral    SpO2: 99%  98% 96%  Weight:      Height:        Intake/Output Summary (Last 24 hours) at 10/08/2018 1140 Last data filed at 10/08/2018 0424 Gross per 24 hour  Intake 2076.74 ml  Output 1175 ml  Net 901.74 ml   Filed Weights   10/06/18 0700  Weight: 52 kg   General appearance: To be awake alert.  Still distracted.  Still agitated at times.  In no distress.   Resp: Clear to auscultation bilaterally.  Normal  effort Cardio: S1-S2 is normal regular.  No S3-S4.  No rubs murmurs or bruit GI: Abdomen is soft.  Nontender nondistended.  Bowel sounds are present normal.  No masses organomegaly Extremities: Edema noted bilateral lower extremities.  Skin wounds present covered with dressings. Neurologic: Noted to be awake alert today.  Moving all his extremities.    Lab Results:  Data Reviewed: I have personally reviewed following labs and imaging studies  CBC: Recent Labs  Lab 10/25/2018 1651 10/06/18 0248 10/07/18 0253 10/08/18 0011  WBC 13.2* 9.1 8.2 7.0  NEUTROABS 10.0*  --   --   --   HGB 10.9* 8.8* 8.3* 8.4*  HCT 36.1* 30.5* 28.2* 27.8*  MCV 109.1* 112.5* 110.2* 108.2*  PLT 279 228 207 947    Basic Metabolic Panel: Recent Labs  Lab 10/13/2018 1651 10/01/2018 1903 10/25/2018 2255 10/06/18 0248 10/07/18 0253 10/08/18 0011  NA 151*  --  149* 150* 150* 147*  K 4.2  --  4.1 3.7 3.6 3.1*  CL 114*  --  121* 121* 124* 120*  CO2 26  --  20* 20* 20* 22  GLUCOSE 130*  --  124* 142* 133* 155*  BUN 66*  --  52* 49* 22 14  CREATININE 2.04*  --  1.57* 1.24 0.95 1.02  CALCIUM 8.5*  --  7.6* 7.5* 7.1* 6.9*  MG  --  2.5*  --  2.1  --   --  PHOS  --  5.0*  --  3.4  --   --     GFR: Estimated Creatinine Clearance: 53.8 mL/min (by C-G formula based on SCr of 1.02 mg/dL).  Liver Function Tests: Recent Labs  Lab 10/16/2018 1651 10/06/18 0248 10/07/18 0253 10/08/18 0011  AST 78* 57* 64* 79*  ALT 54* 40 40 46*  ALKPHOS 101 69 67 103  BILITOT 0.4 0.5 0.5 0.4  PROT 7.1 5.3* 4.7* 4.6*  ALBUMIN 2.6* 1.9* 1.6* 1.6*     Recent Labs  Lab 10/25/2018 1651  AMMONIA 16    Coagulation Profile: Recent Labs  Lab 10/03/2018 1731 10/06/18 1518  INR 1.43 1.44    Cardiac Enzymes: Recent Labs  Lab 10/03/2018 1903 10/10/2018 2106 10/06/18 0248 10/08/18 0011  CKTOTAL 898*  --  728* 528*  TROPONINI  --  <0.03  --   --     CBG: Recent Labs  Lab 09/28/2018 1645  GLUCAP 125*    Thyroid Function  Tests: Recent Labs    10/06/18 0248  TSH 4.303    Anemia Panel: Recent Labs    10/22/2018 1748  VITAMINB12 799  FOLATE 26.0  FERRITIN 1,686*  TIBC 124*  IRON 21*  RETICCTPCT 1.7    Recent Results (from the past 240 hour(s))  Urine culture     Status: None   Collection Time: 10/04/2018  3:32 PM  Result Value Ref Range Status   Specimen Description   Final    URINE, CATHETERIZED Performed at [facility], [place] [address], [place], [place] [address]    Special Requests   Final    NONE Performed at [facility], [place] [address] [place], [place] [address]    Culture   Final    NO GROWTH Performed at [facility], [place] [address] [place], [place] [address]    Report Status 10/07/2018 FINAL  Final  Blood Culture (routine x 2)     Status: None (Preliminary result)   Collection Time: 10/26/2018  4:50 PM  Result Value Ref Range Status   Specimen Description   Final    BLOOD RIGHT ARM Performed at [facility] [address], [place], [place] [address]    Special Requests   Final    BOTTLES DRAWN AEROBIC AND ANAEROBIC Blood Culture adequate volume   Culture   Final    NO GROWTH 3 DAYS Performed at [facility], [place] [address] [place], [place] [address]    Report Status PENDING  Incomplete  Blood Culture (routine x 2)     Status: None (Preliminary result)   Collection Time: 10/13/2018  5:06 PM  Result Value Ref Range Status   Specimen Description BLOOD BLOOD LEFT FOREARM  Final   Special Requests   Final    BOTTLES DRAWN AEROBIC AND ANAEROBIC Blood Culture adequate volume Performed at [place] [address], [place], [place] [address]    Culture   Final    NO GROWTH 3 DAYS Performed at [facility], [place] [address], [place],  [address]    Report Status PENDING  Incomplete  MRSA PCR Screening     Status: None   Collection Time: 10/07/2018  8:57 PM  Result Value Ref  Range Status   MRSA by PCR NEGATIVE NEGATIVE Final    Comment:        The GeneXpert MRSA Assay (FDA approved for NASAL specimens only), is one component of  a comprehensive MRSA colonization surveillance program. It is not intended to diagnose MRSA infection nor to guide or monitor treatment for MRSA infections. Performed at [facility], [place] [address], [place], [place] [address]       Radiology Studies: Ct Angio Chest Pe W Or Wo Contrast  Result Date: 10/06/2018 CLINICAL DATA:  Left lower extremity DVT on recent vascular imaging EXAM: CT ANGIOGRAPHY CHEST WITH CONTRAST TECHNIQUE: Multidetector CT imaging of the chest was performed using the standard protocol during bolus administration of intravenous contrast. Multiplanar CT image reconstructions and MIPs were obtained to evaluate the vascular anatomy. CONTRAST:  170mL ISOVUE-370 IOPAMIDOL (ISOVUE-370) INJECTION 76% COMPARISON:  Plain film from 09/29/2018 FINDINGS: Cardiovascular: Thoracic aorta shows no evidence of aneurysmal dilatation or dissection. Cardiac shadow is at the upper limits of normal in size. Pulmonary artery is well visualize with a normal branching pattern. Filling defects are noted within the right lower lobe pulmonary arterial branches consistent with pulmonary emboli. No definitive left-sided pulmonary emboli is noted. No evidence of right heart strain is noted. Mediastinum/Nodes: The esophagus is within normal limits. No sizable hilar or mediastinal adenopathy is noted. The thoracic inlet is within normal limits. Lungs/Pleura: The lungs are well aerated bilaterally without focal infiltrate or sizable effusion. No pneumothorax is seen. Upper Abdomen: Visualized upper abdomen shows no acute abnormality. Musculoskeletal: Degenerative changes of the thoracic spine are noted. Review of the MIP images confirms the above findings. IMPRESSION: Right lower lobe pulmonary emboli without evidence of right heart  strain. This corresponds with the known left lower extremity deep venous thrombosis. No other focal abnormality is seen. These results will be called to the ordering clinician or representative by the Radiologist Assistant, and communication documented in the PACS or zVision Dashboard. Electronically Signed   By: Inez Catalina M.D.   On: 10/06/2018 16:58   Vas [place] Lower Extremity Venous (dvt)  Result Date: 10/06/2018  Lower Venous Study Indications: Swelling.  Limitations: Body habitus and contracted state. Performing Technologist: Maudry Mayhew MHA, RDMS, RVT, RDCS  Examination Guidelines: A complete evaluation includes B-mode imaging, spectral Doppler, color Doppler, and power Doppler as needed of all accessible portions of each vessel. Bilateral testing is considered an integral part of a complete examination. Limited examinations for reoccurring indications may be performed as noted.  Right Venous Findings: +---+---------------+---------+-----------+----------+--------------+    CompressibilityPhasicitySpontaneityPropertiesSummary        +---+---------------+---------+-----------+----------+--------------+ CFV                                             Not visualized +---+---------------+---------+-----------+----------+--------------+  Left Venous Findings: +---------+---------------+---------+-----------+----------+--------------+          CompressibilityPhasicitySpontaneityPropertiesSummary        +---------+---------------+---------+-----------+----------+--------------+ CFV                                                   Not visualized +---------+---------------+---------+-----------+----------+--------------+ SFJ                                                   Not visualized +---------+---------------+---------+-----------+----------+--------------+ FV Prox  None  No                   Acute           +---------+---------------+---------+-----------+----------+--------------+ FV Mid   None                                         Acute          +---------+---------------+---------+-----------+----------+--------------+ FV DistalNone                                         Acute          +---------+---------------+---------+-----------+----------+--------------+ PFV                                                   Not visualized +---------+---------------+---------+-----------+----------+--------------+ POP      None                    No                   Acute          +---------+---------------+---------+-----------+----------+--------------+ PTV      Full                    Yes                                 +---------+---------------+---------+-----------+----------+--------------+ PERO     Full                    Yes                                 +---------+---------------+---------+-----------+----------+--------------+ SSV      None                                         Acute          +---------+---------------+---------+-----------+----------+--------------+    Summary: Left: Findings consistent with acute deep vein thrombosis involving the left femoral vein, and left popliteal vein. Findings consistent with acute superficial vein thrombosis involving the left small saphenous vein. No cystic structure found in the popliteal fossa.  *See table(s) above for measurements and observations. Electronically signed by Quay Burow MD on 10/06/2018 at 6:07:23 PM.    Final      Medications:  Scheduled: . allopurinol  300 mg Oral q morning - 10a  . chlorhexidine  15 mL Mouth Rinse BID  . folic acid  1 mg Intravenous Daily  . magic mouthwash w/lidocaine  10 mL Oral QID  . mouth rinse  15 mL Mouth Rinse BID  . midodrine  10 mg Oral TID WC  . mupirocin ointment   Topical BID  . phenytoin (DILANTIN) IV  100 mg Intravenous Q8H  . senna  1 tablet Oral BID   . simvastatin  20 mg Oral QPM  . thiamine injection  100 mg Intravenous  Daily   Continuous: . dextrose 5 % and 0.9% NaCl 100 mL/hr at 10/08/18 0305  . heparin 1,650 Units/hr (10/08/18 0615)  . potassium chloride 10 mEq (10/08/18 1023)   AOZ:HYQMVHQIONGEX **OR** acetaminophen, bisacodyl, diphenhydrAMINE, HYDROcodone-acetaminophen    Assessment/Plan:  Hypotension, etiology unclear Apparently patient has some degree of chronic hypotension.  Old records reviewed.  Blood pressure has never been this low however.  Patient not noted to be on any antihypertensives.  Flomax was discontinued.  Lactic acid level was normal.  Cortisol level was normal.  No obvious source of infection identified.  UA was clear.  Chest x-ray did not show any infiltrates.  He remains afebrile.  Due to persistent low blood pressure values patient was started on midodrine.  Blood pressure appears to have improved some today.  Patient does have pulmonary embolism but it was not significant enough to cause this low blood pressure.  Echocardiogram was ordered and was attempted however patient did not cooperate.  Will hold off for now.    Acute pulmonary embolism and lower extremity DVT Patient was found to have acute DVT in the lower extremity.  CT scan of the chest was done which revealed pulmonary embolism without any evidence for heart strain.  Continue IV heparin for now.  Hopefully if he stabilizes over the next 24 hours he can be transitioned to oral agents.  Acute renal failure with hypernatremia and hypokalemia This was most likely due to poor oral intake over the past many days prior to admission.  Renal function has improved with IV hydration.  Sodium has improved this morning.  Potassium noted to be low and will be repleted.  Mild rhabdomyolysis CK level was noted to be elevated.  Improved with hydration.  Levels have improved.  QT prolongation Avoid QT prolonging medications.  Monitor on telemetry.  Mildly  abnormal LFTs Most likely in the setting of rhabdomyolysis.  Hepatitis panel is unremarkable.  Stable.  Patient with known history of cholelithiasis.  However his right upper quadrant is benign on examination.  Do not anticipate any further testing at this time.  History of seizure disorder EEG was done which revealed epileptiform activity.  Unclear if this was clinically significant.  However patient's Dilantin level was subtherapeutic at admission.  After discussions with neurology a loading dose of Dilantin was given.  No repeat EEG per neurology.  Repeat Dilantin level tomorrow.  His level yesterday was rightly supratherapeutic.  He was continued on same dose of Dilantin.  CT scan done at the time of admission did not show any acute findings.    Macrocytic anemia Hemoglobin dropped likely due to dilution.  No evidence of overt bleeding.  Anemia panel reviewed.  Ferritin 1686.  Folic acid level of 26.  Vitamin B12 level 799.  Hemoglobin is low but stable.  Acute metabolic encephalopathy Patient with known history of mental retardation.  See above under seizure disorder as well.  Patient appears to be improving gradually.  History of mental retardation Patient has poor quality of life at baseline.  Apparently has been declining over the past several months.  Has had falls.  He is now wheelchair-bound.  Allergy medicine is following.  Discussions held with the patient's legal guardian.  He is now DNR.                 DVT Prophylaxis: On IV heparin Code Status: DNR Family Communication: No family or friends at bedside. Disposition Plan: Management as outlined above.  Hopefully his blood  pressures will improve today.    LOS: 3 days   [facility] Pager [phone] 10/08/2018, 11:40 AM  If 7PM-7AM, please contact night-coverage at [email or web address], password [email or web address]

## 2018-10-08 NOTE — Progress Notes (Signed)
[place] for heparin Indication: DVT & PE  Allergies  Allergen Reactions  . Amoxicillin Rash    Unsure wasn't listed on [facility] 01/29/17.  Severe rash Unsure wasn't listed on [facility] 01/29/17.  Severe rash  . Nsaids Other (See Comments)    Avoid NSAIDS--  Strong family hx liver cancer Avoid NSAIDS--  Strong family hx liver cancer   Patient Measurements: Height: 5\' 2"  (157.5 cm) Weight: 114 lb 10.2 oz (52 kg) IBW/kg (Calculated) : 54.6 Heparin Dosing Weight: 52kg  Vital Signs: Temp: 99.5 F (37.5 C) (01/11 1545) Temp Source: Axillary (01/11 1545) BP: 92/48 (01/11 1800) Pulse Rate: 75 (01/11 1800)  Labs: Recent Labs    10/14/2018 2106  10/06/18 0248 10/06/18 1518  10/07/18 0253  10/08/18 0011 10/08/18 0513 10/08/18 1205 10/08/18 1818  HGB  --    < > 8.8*  --   --  8.3*  --  8.4*  --   --   --   HCT  --   --  30.5*  --   --  28.2*  --  27.8*  --   --   --   PLT  --   --  228  --   --  207  --  190  --   --   --   APTT  --   --   --  34  --   --   --   --   --   --   --   LABPROT  --   --   --  17.4*  --   --   --   --   --   --   --   INR  --   --   --  1.44  --   --   --   --   --   --   --   HEPARINUNFRC  --   --   --   --    < >  --    < > 0.61 0.77* 0.65 0.72*  CREATININE  --    < > 1.24  --   --  0.95  --  1.02  --   --   --   CKTOTAL  --   --  728*  --   --   --   --  528*  --   --   --   TROPONINI <0.03  --   --   --   --   --   --   --   --   --   --    < > = values in this interval not displayed.   Estimated Creatinine Clearance: 53.8 mL/min (by C-G formula based on SCr of 1.02 mg/dL).   Assessment:  63 year old male with a past medical history of mental retardation who lives in a group home who is wheelchair-bound at baseline.  Patient was brought into the hospital due to decreased oral intake for the past 3 days.   Pharmacy has been consulted to dose heparin drip for DVT and PE.   Baseline INR borderline elevated, aPTT  WNL  Prior anticoagulation: none   Today, 10/08/2018 7:06 PM   Confirmatory HL slightly above goal at 0.72  CBC - Hgb 8.4, stable. Plts 190 stable.  No reported or line issues per RN  Goal of Therapy: Heparin level 0.3-0.7 units/ml Monitor platelets by anticoagulation protocol: Yes  Plan: -  Decrease heparin infusion  to 1550 units/hr - HL/CBC with AM labs - Monitor for bleeding  Ulice Dash, PharmD Clinical Pharmacist Pager # [phone]  10/08/2018 7:06 PM

## 2018-10-08 NOTE — Progress Notes (Signed)
LCSW placed guardianship paperwork on patients chart.   Patient awaiting PT/OT.  LCSW met at bedside with patient and family. Patients sister and legal guardian, Sean Murray is present along with other siblings and in laws.   She reports that patient is wheel chair bound at baseline and required assistance with all ADLs including feeding.   Sean Murray is realistic about patients condition and understands that he may need long term placement. Family gave facility preferences to [facility] with Palliative.   LCSW will continue to follow for disposition.   Carolin Coy [facility] [place]

## 2018-10-08 NOTE — Progress Notes (Signed)
Patient's BP low 61/46. Paged Sean Murray. New order to give 500 cc bolus and to give midodrine early. WIll continue to monitor.

## 2018-10-08 NOTE — Progress Notes (Signed)
[place] for heparin Indication: DVT & PE  Allergies  Allergen Reactions  . Amoxicillin Rash    Unsure wasn't listed on [facility] 01/29/17.  Severe rash Unsure wasn't listed on [facility] 01/29/17.  Severe rash  . Nsaids Other (See Comments)    Avoid NSAIDS--  Strong family hx liver cancer Avoid NSAIDS--  Strong family hx liver cancer   Patient Measurements: Height: 5\' 2"  (157.5 cm) Weight: 114 lb 10.2 oz (52 kg) IBW/kg (Calculated) : 54.6 Heparin Dosing Weight: 52kg  Vital Signs: Temp: 99.8 F (37.7 C) (01/11 1200) Temp Source: Oral (01/11 1200) BP: 103/48 (01/11 1012) Pulse Rate: 85 (01/11 1012)  Labs: Recent Labs    10/10/2018 1731 10/19/2018 1903 10/20/2018 2106  10/06/18 0248 10/06/18 1518  10/07/18 0253  10/08/18 0011 10/08/18 0513 10/08/18 1205  HGB  --   --   --   --  8.8*  --   --  8.3*  --  8.4*  --   --   HCT  --   --   --   --  30.5*  --   --  28.2*  --  27.8*  --   --   PLT  --   --   --   --  228  --   --  207  --  190  --   --   APTT  --   --   --   --   --  34  --   --   --   --   --   --   LABPROT 17.2*  --   --   --   --  17.4*  --   --   --   --   --   --   INR 1.43  --   --   --   --  1.44  --   --   --   --   --   --   HEPARINUNFRC  --   --   --   --   --   --    < >  --    < > 0.61 0.77* 0.65  CREATININE  --   --   --    < > 1.24  --   --  0.95  --  1.02  --   --   CKTOTAL  --  898*  --   --  728*  --   --   --   --  528*  --   --   TROPONINI  --   --  <0.03  --   --   --   --   --   --   --   --   --    < > = values in this interval not displayed.   Estimated Creatinine Clearance: 53.8 mL/min (by C-G formula based on SCr of 1.02 mg/dL).   Assessment:  63 year old male with a past medical history of mental retardation who lives in a group home who is wheelchair-bound at baseline.  Patient was brought into the hospital due to decreased oral intake for the past 3 days.   Pharmacy has been consulted to dose heparin drip for  DVT and PE.   Baseline INR borderline elevated, aPTT WNL  Prior anticoagulation: none   Today, 10/08/2018:  HL therapeutic on current IV heparin rate of 1650 units/hr after rate was decreased from 1750 units/hr early this AM  CBC - Hgb 8.4, stable. Plts 190 stable.  No reported bleeding  Goal of Therapy: Heparin level 0.3-0.7 units/ml Monitor platelets by anticoagulation protocol: Yes  Plan: 1) Continue current IV heparin rate of 1650 units/hr 2) Recheck heparin level at 1800 to confirm continued goal heparin level on current IV heparin rate 3) Daily CBC and HL   Adrian Saran, PharmD, BCPS 10/08/2018 1:11 PM

## 2018-10-08 NOTE — Evaluation (Signed)
Physical Therapy Evaluation Patient Details Name: Sean Murray MRN: [id number] DOB: 1955/10/26 Today's Date: 10/08/2018   History of Present Illness  pt was admitted with acute rental failure/dehydration.  Has DVT/PE.   H/O MR, seizures  Clinical Impression  Pt admitted as above and presenting with functional mobility limitations 2* generalized weakness, balance deficits, lethargy and cognitive limitations.  Pt would benefit from follow up rehab at SNF level to maximize IND and safety.    Follow Up Recommendations SNF    Equipment Recommendations  None recommended by PT    Recommendations for Other Services       Precautions / Restrictions Precautions Precautions: Fall Restrictions Weight Bearing Restrictions: No      Mobility  Bed Mobility Overal bed mobility: Needs Assistance Bed Mobility: Supine to Sit;Sit to Supine     Supine to sit: +2 for physical assistance;Total assist Sit to supine: +2 for physical assistance;Total assist   General bed mobility comments: total A +2 for supine <> sit  Transfers                 General transfer comment: unable  Ambulation/Gait                Stairs            Wheelchair Mobility    Modified Rankin (Stroke Patients Only)       Balance Overall balance assessment: Needs assistance   Sitting balance-Leahy Scale: Poor Sitting balance - Comments: min to mod A for balance once assisted to EOB; returned to supine due to BP                                     Pertinent Vitals/Pain Pain Assessment: Faces Faces Pain Scale: Hurts even more Pain Location: LE when moved out of full flexion first time Pain Descriptors / Indicators: Moaning Pain Intervention(s): Limited activity within patient's tolerance;Monitored during session;Repositioned    Home Living Family/patient expects to be discharged to:: Unsure                      Prior Function Level of Independence: Needs assistance          Comments: PLOF unknown.  RN spoke to [facility] who is not local, and didn't get functional status information.  Friends came at the end of the session; they were not clear about what pt was doing either--stated up to couch but that he didn't get OOB     Hand Dominance        Extremity/Trunk Assessment   Upper Extremity Assessment Upper Extremity Assessment: Difficult to assess due to impaired cognition    Lower Extremity Assessment Lower Extremity Assessment: Difficult to assess due to impaired cognition(Knees in flexed posture but tolerated PROM into extension)       Communication   Communication: (pt spoke briefly - difficult to understand)  Cognition Arousal/Alertness: Lethargic Behavior During Therapy: WFL for tasks assessed/performed Overall Cognitive Status: No family/caregiver present to determine baseline cognitive functioning                                 General Comments: MR at baseline, did not follow commands      General Comments General comments (skin integrity, edema, etc.): supine BP 103/48; sitting 83/47    Exercises     Assessment/Plan  PT Assessment Patient needs continued PT services  PT Problem List Decreased strength;Decreased range of motion;Decreased activity tolerance;Decreased balance;Decreased mobility;Decreased cognition;Decreased knowledge of use of DME;Pain       PT Treatment Interventions DME instruction;Gait training;Functional mobility training;Therapeutic activities;Therapeutic exercise;Balance training;Patient/family education    PT Goals (Current goals can be found in the Care Plan section)  Acute Rehab PT Goals Patient Stated Goal: unable to state PT Goal Formulation: Patient unable to participate in goal setting Time For Goal Achievement: 10/22/18 Potential to Achieve Goals: Fair    Frequency Min 2X/week   Barriers to discharge        Co-evaluation               AM-PAC PT "6 Clicks"  Mobility  Outcome Measure Help needed turning from your back to your side while in a flat bed without using bedrails?: Total Help needed moving from lying on your back to sitting on the side of a flat bed without using bedrails?: Total Help needed moving to and from a bed to a chair (including a wheelchair)?: Total Help needed standing up from a chair using your arms (e.g., wheelchair or bedside chair)?: Total Help needed to walk in hospital room?: Total Help needed climbing 3-5 steps with a railing? : Total 6 Click Score: 6    End of Session Equipment Utilized During Treatment: Oxygen Activity Tolerance: Patient limited by lethargy Patient left: in bed;with call bell/phone within reach;with family/visitor present;with bed alarm set Nurse Communication: Mobility status PT Visit Diagnosis: Muscle weakness (generalized) (M62.81);Difficulty in walking, not elsewhere classified (R26.2)    Time: 7169-6789 PT Time Calculation (min) (ACUTE ONLY): 18 min   Charges:   PT Evaluation $PT Eval Low Complexity: 1 Low          [facility] Pager [phone] Office [phone]   Aracelly Tencza 10/08/2018, 3:54 PM

## 2018-10-09 DIAGNOSIS — L03113 Cellulitis of right upper limb: Secondary | ICD-10-CM

## 2018-10-09 LAB — COMPREHENSIVE METABOLIC PANEL
ALT: 37 U/L (ref 0–44)
AST: 50 U/L — ABNORMAL HIGH (ref 15–41)
Albumin: 1.3 g/dL — ABNORMAL LOW (ref 3.5–5.0)
Alkaline Phosphatase: 108 U/L (ref 38–126)
Anion gap: 5 (ref 5–15)
BUN: 11 mg/dL (ref 8–23)
CO2: 21 mmol/L — AB (ref 22–32)
Calcium: 6.6 mg/dL — ABNORMAL LOW (ref 8.9–10.3)
Chloride: 116 mmol/L — ABNORMAL HIGH (ref 98–111)
Creatinine, Ser: 0.92 mg/dL (ref 0.61–1.24)
GFR calc Af Amer: >60 mL/min (ref >60)
GFR calc non Af Amer: >60 mL/min (ref >60)
Glucose, Bld: 118 mg/dL — ABNORMAL HIGH (ref 70–99)
Potassium: 3.2 mmol/L — ABNORMAL LOW (ref 3.5–5.1)
SODIUM: 142 mmol/L (ref 135–145)
Total Bilirubin: 0.3 mg/dL (ref 0.3–1.2)
Total Protein: 4.1 g/dL — ABNORMAL LOW (ref 6.5–8.1)

## 2018-10-09 LAB — HEPARIN LEVEL (UNFRACTIONATED)
Heparin Unfractionated: 0.56 IU/mL (ref 0.30–0.70)
Heparin Unfractionated: 0.69 IU/mL (ref 0.30–0.70)

## 2018-10-09 LAB — LACTIC ACID, PLASMA: Lactic Acid, Venous: 1.4 mmol/L (ref 0.5–1.9)

## 2018-10-09 LAB — CBC
HCT: 26.1 % — ABNORMAL LOW (ref 39.0–52.0)
HEMOGLOBIN: 7.8 g/dL — AB (ref 13.0–17.0)
MCH: 32.4 pg (ref 26.0–34.0)
MCHC: 29.9 g/dL — ABNORMAL LOW (ref 30.0–36.0)
MCV: 108.3 fL — ABNORMAL HIGH (ref 80.0–100.0)
Platelets: 194 10*3/uL (ref 150–400)
RBC: 2.41 MIL/uL — ABNORMAL LOW (ref 4.22–5.81)
RDW: 14.9 % (ref 11.5–15.5)
WBC: 13.5 10*3/uL — ABNORMAL HIGH (ref 4.0–10.5)
nRBC: 0 % (ref 0.0–0.2)

## 2018-10-09 LAB — MAGNESIUM: Magnesium: 1.5 mg/dL — ABNORMAL LOW (ref 1.7–2.4)

## 2018-10-09 LAB — PHENYTOIN LEVEL, TOTAL: Phenytoin Lvl: 6.5 ug/mL — ABNORMAL LOW (ref 10.0–20.0)

## 2018-10-09 MED ORDER — OXYMETAZOLINE HCL 0.05 % NA SOLN: 2.0000 | Freq: Two times a day (BID) | NASAL | Status: DC

## 2018-10-09 MED ORDER — CEFAZOLIN SODIUM-DEXTROSE 1-4 GM/50ML-% IV SOLN
1.0000 g | Freq: Three times a day (TID) | INTRAVENOUS | Status: DC
  Administered 2018-10-09 – 2018-10-10 (×4): 1 g via INTRAVENOUS
  Filled 2018-10-09 (×5): qty 50

## 2018-10-09 MED ORDER — OXYMETAZOLINE HCL 0.05 % NA SOLN
2.0000 | Freq: Two times a day (BID) | NASAL | Status: DC
  Administered 2018-10-09 – 2018-10-10 (×3): 2 via NASAL
  Filled 2018-10-09: qty 15

## 2018-10-09 MED ORDER — SODIUM CHLORIDE 0.9 % IV BOLUS
500.0000 mL | Freq: Once | INTRAVENOUS | Status: AC
  Administered 2018-10-09: 500 mL via INTRAVENOUS

## 2018-10-09 MED ORDER — POTASSIUM CHLORIDE IN NACL 20-0.9 MEQ/L-% IV SOLN
INTRAVENOUS | Status: DC
  Administered 2018-10-09: 10:00:00 via INTRAVENOUS
  Filled 2018-10-09 (×2): qty 1000

## 2018-10-09 MED ORDER — POTASSIUM CHLORIDE 10 MEQ/100ML IV SOLN
10.0000 meq | INTRAVENOUS | Status: AC
  Administered 2018-10-09 (×4): 10 meq via INTRAVENOUS
  Filled 2018-10-09 (×4): qty 100

## 2018-10-09 MED ORDER — POTASSIUM CHLORIDE 10 MEQ/100ML IV SOLN: 10.0000 meq | INTRAVENOUS | Status: DC

## 2018-10-09 MED ORDER — ORAL CARE MOUTH RINSE: 15.0000 mL | Freq: Two times a day (BID) | OROMUCOSAL | Status: DC

## 2018-10-09 MED ORDER — LACTATED RINGERS IV BOLUS
500.0000 mL | Freq: Once | INTRAVENOUS | Status: AC
  Administered 2018-10-09: 500 mL via INTRAVENOUS

## 2018-10-09 MED ORDER — MAGNESIUM SULFATE 4 GM/100ML IV SOLN
4.0000 g | Freq: Once | INTRAVENOUS | Status: AC
  Administered 2018-10-09: 4 g via INTRAVENOUS
  Filled 2018-10-09: qty 100

## 2018-10-09 NOTE — Progress Notes (Signed)
[place] for heparin Indication: DVT & PE  Allergies  Allergen Reactions  . Amoxicillin Rash    Unsure wasn't listed on [facility] 01/29/17.  Severe rash Unsure wasn't listed on [facility] 01/29/17.  Severe rash  . Nsaids Other (See Comments)    Avoid NSAIDS--  Strong family hx liver cancer Avoid NSAIDS--  Strong family hx liver cancer   Patient Measurements: Height: 5\' 2"  (157.5 cm) Weight: 114 lb 10.2 oz (52 kg) IBW/kg (Calculated) : 54.6 Heparin Dosing Weight: 52kg  Vital Signs: Temp: 98.1 F (36.7 C) (01/12 1200) Temp Source: Oral (01/12 1200) BP: 79/33 (01/12 0900) Pulse Rate: 64 (01/12 0900)  Labs: Recent Labs    10/06/18 1518  10/07/18 0253  10/08/18 0011  10/08/18 1818 10/09/18 0310 10/09/18 1327  HGB  --    < > 8.3*  --  8.4*  --   --  7.8*  --   HCT  --   --  28.2*  --  27.8*  --   --  26.1*  --   PLT  --   --  207  --  190  --   --  194  --   APTT 34  --   --   --   --   --   --   --   --   LABPROT 17.4*  --   --   --   --   --   --   --   --   INR 1.44  --   --   --   --   --   --   --   --   HEPARINUNFRC  --    < >  --    < > 0.61   < > 0.72* 0.56 0.69  CREATININE  --   --  0.95  --  1.02  --   --  0.92  --   CKTOTAL  --   --   --   --  528*  --   --   --   --    < > = values in this interval not displayed.   Estimated Creatinine Clearance: 59.7 mL/min (by C-G formula based on SCr of 0.92 mg/dL).   Assessment:  63 year old male with a past medical history of mental retardation who lives in a group home who is wheelchair-bound at baseline.  Patient was brought into the hospital due to decreased oral intake for the past 3 days.   Pharmacy has been consulted to dose heparin drip for DVT and PE.  Baseline INR borderline elevated, aPTT WNL  Prior anticoagulation: none   Today, 10/09/2018 2:30 PM   Heparin level remains therapeutic on current IV heparin rate of 1550 units/hr  CBC - Hgb 7.8, stable. Plts 194 stable.  No  reported or line issues per RN  Goal of Therapy: Heparin level 0.3-0.7 units/ml Monitor platelets by anticoagulation protocol: Yes  Plan: 1) Continue IV heparin at current rate of 1550 units/hr 2) daily HL/CBC 3) Monitor for bleeding   Ulice Dash, PharmD Clinical Pharmacist Pager # [phone]  10/09/2018 2:30 PM

## 2018-10-09 NOTE — Progress Notes (Signed)
538mL LR bolus administered d/t low MAP (40s). Pressures remained low after administration of bolus (73/34, MAP 45). XKennon Holter notified of persistently low BP. Will continue to monitor

## 2018-10-09 NOTE — Progress Notes (Signed)
Pharmacy Antibiotic Note  Sean Murray is a 63 y.o. male admitted on 10/22/2018 with cellulitis.  Pharmacy has been consulted for Ancef dosing.  Plan: Ancef 1g IV q8 based on current renal function  Height: 5\' 2"  (157.5 cm) Weight: 114 lb 10.2 oz (52 kg) IBW/kg (Calculated) : 54.6  Temp (24hrs), Avg:99.3 F (37.4 C), Min:98.1 F (36.7 C), Max:101.1 F (38.4 C)  Recent Labs  Lab 10/27/2018 1613  10/03/2018 1651 10/09/2018 1941 10/23/2018 2255 10/06/18 0248 10/06/18 0833 10/07/18 0253 10/08/18 0011 10/09/18 0310  WBC  --   --  13.2*  --   --  9.1  --  8.2 7.0 13.5*  CREATININE  --    < > 2.04*  --  1.57* 1.24  --  0.95 1.02 0.92  LATICACIDVEN 2.23*  --   --  0.93  --   --  0.8  --   --   --    < > = values in this interval not displayed.    Estimated Creatinine Clearance: 59.7 mL/min (by C-G formula based on SCr of 0.92 mg/dL).    Allergies  Allergen Reactions  . Amoxicillin Rash    Unsure wasn't listed on [facility] 01/29/17.  Severe rash Unsure wasn't listed on [facility] 01/29/17.  Severe rash  . Nsaids Other (See Comments)    Avoid NSAIDS--  Strong family hx liver cancer Avoid NSAIDS--  Strong family hx liver cancer   Thank you for allowing pharmacy to be a part of this patient's care.  Kara Mead 10/09/2018 8:49 AM

## 2018-10-09 NOTE — Progress Notes (Signed)
[facility] PROGRESS NOTE  Sean Murray JOI:[id number] DOB: 02-11-1956 DOA: 10/10/2018  PCP: [name], MD  Brief History/Interval Summary: 63 year old male with a past medical history of mental retardation who lives in a group home who is wheelchair-bound at baseline.  Her legal guardian who is apparently his sister who lives in [place].  Patient was brought into the hospital due to decreased oral intake for the past 3 days.  Apparently patient has dementia at baseline and has been progressively getting worse over the past many months.  Reason for Visit: Acute renal failure.  Hyponatremia.  Hypotension  Consultants: Palliative medicine  Procedures:   EEG This is an abnormal electroencephalogram secondary to general background slowing and GPEDs.  Also noted are short periods when the epileptiform discharges are more regular at a rate of about 3-4 per second.  Unclear significance but can not rule out subclinical seizure activity.  Clinical correlation recommended.  Transthoracic echocardiogram Study Conclusions  - Left ventricle: Systolic function was mildly to moderately   reduced. The estimated ejection fraction was in the range of 40%   to 45%. Cannot exclude hypokinesis of the anteroseptal, anterior,   and anterolateral myocardium. - Ventricular septum: Septal motion showed dyssynergy. These   changes are consistent with a left bundle branch block. - Impressions: Incomplete sutdy as patient terminated study prior   to exam completion. On available images EF appears to be in the   40-45% range with hypokinesis of the anterior wall, aanteroseptum   and anterolateral walls. As there are no 2-chamber images hard to   tell if this is all due to LBBB or if there is also a regional   wall motion abnormlaity due to previous infarct.  Impressions:  - Incomplete sutdy as patient terminated study prior to exam   completion. On available images EF appears to be  in the 40-45%   range with hypokinesis of the anterior wall, aanteroseptum and   anterolateral walls. As there are no 2-chamber images hard to   tell if this is all due to LBBB or if there is also a regional   wall motion abnormlaity due to previous infarct.  Antibiotics: None  Subjective/Interval History: Patient lying on the bed awake with eyes open.  Tracks me around the room.  Says a few words but does not really answer questions.    ROS: Unable to do due to his mental retardation  Objective:  Vital Signs  Vitals:   10/09/18 0300 10/09/18 0402 10/09/18 0800 10/09/18 0900  BP: (!) 70/34  (!) 82/45 (!) 79/33  Pulse: 65  68 64  Resp: 20  20 16   Temp:  98.1 F (36.7 C) 98.2 F (36.8 C)   TempSrc:  Oral Oral   SpO2: 98%  100% 100%  Weight:      Height:        Intake/Output Summary (Last 24 hours) at 10/09/2018 1048 Last data filed at 10/09/2018 0900 Gross per 24 hour  Intake 3048.82 ml  Output 1150 ml  Net 1898.82 ml   Filed Weights   10/06/18 0700  Weight: 52 kg   General appearance: Noted to be awake alert today.  In no distress.  Mildly distracted. Resp: Clear to auscultation bilaterally.  Normal effort Cardio: S1-S2 is normal regular.  No S3-S4.  No rubs murmurs or bruit GI: Abdomen is soft.  Nontender nondistended.  Bowel sounds are present normal.  No masses organomegaly Extremities: Right upper extremity noted to be swollen  at the site of previous IV.  Mild erythema.  Mild warmth.  Radial pulses present. Neurologic: Distracted.  Moving all his extremities.    Lab Results:  Data Reviewed: I have personally reviewed following labs and imaging studies  CBC: Recent Labs  Lab 09/29/2018 1651 10/06/18 0248 10/07/18 0253 10/08/18 0011 10/09/18 0310  WBC 13.2* 9.1 8.2 7.0 13.5*  NEUTROABS 10.0*  --   --   --   --   HGB 10.9* 8.8* 8.3* 8.4* 7.8*  HCT 36.1* 30.5* 28.2* 27.8* 26.1*  MCV 109.1* 112.5* 110.2* 108.2* 108.3*  PLT 279 228 207 190 194    Basic  Metabolic Panel: Recent Labs  Lab 10/12/2018 1903 10/23/2018 2255 10/06/18 0248 10/07/18 0253 10/08/18 0011 10/09/18 0310  NA  --  149* 150* 150* 147* 142  K  --  4.1 3.7 3.6 3.1* 3.2*  CL  --  121* 121* 124* 120* 116*  CO2  --  20* 20* 20* 22 21*  GLUCOSE  --  124* 142* 133* 155* 118*  BUN  --  52* 49* 22 14 11   CREATININE  --  1.57* 1.24 0.95 1.02 0.92  CALCIUM  --  7.6* 7.5* 7.1* 6.9* 6.6*  MG 2.5*  --  2.1  --   --  1.5*  PHOS 5.0*  --  3.4  --   --   --     GFR: Estimated Creatinine Clearance: 59.7 mL/min (by C-G formula based on SCr of 0.92 mg/dL).  Liver Function Tests: Recent Labs  Lab 10/16/2018 1651 10/06/18 0248 10/07/18 0253 10/08/18 0011 10/09/18 0310  AST 78* 57* 64* 79* 50*  ALT 54* 40 40 46* 37  ALKPHOS 101 69 67 103 108  BILITOT 0.4 0.5 0.5 0.4 0.3  PROT 7.1 5.3* 4.7* 4.6* 4.1*  ALBUMIN 2.6* 1.9* 1.6* 1.6* 1.3*     Recent Labs  Lab 10/16/2018 1651  AMMONIA 16    Coagulation Profile: Recent Labs  Lab 10/20/2018 1731 10/06/18 1518  INR 1.43 1.44    Cardiac Enzymes: Recent Labs  Lab 10/04/2018 1903 10/11/2018 2106 10/06/18 0248 10/08/18 0011  CKTOTAL 898*  --  728* 528*  TROPONINI  --  <0.03  --   --     CBG: Recent Labs  Lab 10/01/2018 1645  GLUCAP 125*     Recent Results (from the past 240 hour(s))  Urine culture     Status: None   Collection Time: 09/28/2018  3:32 PM  Result Value Ref Range Status   Specimen Description   Final    URINE, CATHETERIZED Performed at [facility], [place] [address], [place], [place] [address]    Special Requests   Final    NONE Performed at [facility], [place] [address], [place], [place] [address]    Culture   Final    NO GROWTH Performed at [facility], [place] [address] [place],  [address]    Report Status 10/07/2018 FINAL  Final  Blood Culture (routine x 2)     Status: None (Preliminary result)   Collection Time: 10/23/2018  4:50 PM  Result Value Ref  Range Status   Specimen Description   Final    BLOOD RIGHT ARM Performed at [place] [address], [place],  [address]    Special Requests   Final    BOTTLES DRAWN AEROBIC AND ANAEROBIC Blood Culture adequate volume   Culture   Final    NO  GROWTH 4 DAYS Performed at [facility], [place] [address] [place], [place] [address]    Report Status PENDING  Incomplete  Blood Culture (routine x 2)     Status: None (Preliminary result)   Collection Time: 10/24/2018  5:06 PM  Result Value Ref Range Status   Specimen Description BLOOD BLOOD LEFT FOREARM  Final   Special Requests   Final    BOTTLES DRAWN AEROBIC AND ANAEROBIC Blood Culture adequate volume Performed at [place] [address] [place], [place] [address]    Culture   Final    NO GROWTH 4 DAYS Performed at [facility], [place] [address] [place], [place] [address]    Report Status PENDING  Incomplete  MRSA PCR Screening     Status: None   Collection Time: 10/28/2018  8:57 PM  Result Value Ref Range Status   MRSA by PCR NEGATIVE NEGATIVE Final    Comment:        The GeneXpert MRSA Assay (FDA approved for NASAL specimens only), is one component of a comprehensive MRSA colonization surveillance program. It is not intended to diagnose MRSA infection nor to guide or monitor treatment for MRSA infections. Performed at [facility], [place] [address] [place], [place] [address]       Radiology Studies: No results found.   Medications:  Scheduled: . allopurinol  300 mg Oral q morning - 10a  . chlorhexidine  15 mL Mouth Rinse BID  . folic acid  1 mg Intravenous Daily  . magic mouthwash w/lidocaine  10 mL Oral QID  . mouth rinse  15 mL Mouth Rinse BID  . midodrine  10 mg Oral TID WC  . mupirocin ointment   Topical BID  . phenytoin (DILANTIN) IV  100 mg Intravenous Q8H  . senna  1 tablet Oral BID  . thiamine injection  100 mg Intravenous  Daily   Continuous: . 0.9 % NaCl with KCl 20 mEq / L 100 mL/hr at 10/09/18 0933  . heparin 1,550 Units/hr (10/09/18 0253)  . magnesium sulfate 1 - 4 g bolus IVPB 4 g (10/09/18 0946)  . potassium chloride Stopped (10/09/18 1041)  . sodium chloride     ZSW:[id number] **OR** acetaminophen, bisacodyl, diphenhydrAMINE, HYDROcodone-acetaminophen    Assessment/Plan:  Fever most likely secondary to cellulitis versus thrombophlebitis of the right upper extremity Patient had an IV access in the right upper extremity which infiltrated.  The area is warm to touch.  Cold compresses were applied yesterday.  However patient developed fever overnight.  His WBC has gone up.  We will place him on Ancef.  Doppler to be ordered.  Follow-up on blood cultures.  Lactic acid was rechecked and is again normal.  Hypotension, etiology unclear Apparently patient has some degree of chronic hypotension.  Old records reviewed.  Blood pressure has never been this low however.  Patient not noted to be on any antihypertensives.  Flomax was discontinued.  Lactic acid level was normal.  Cortisol level was normal.  There was no source of infection at the time of initial assessment.  Patient was placed on midodrine.  Echocardiogram shows normal systolic function.  Patient has not improved despite multiple days of IV fluids.  His prognosis is poor at this time.  We will see if there is any improvement with use of antibiotics.    Acute pulmonary embolism and lower extremity DVT Patient was found to have acute DVT in the lower extremity.  CT scan of the chest was done which revealed pulmonary embolism without any evidence for heart strain.  Continue IV heparin for now.  His oral intake is still inconsistent so unable to use oral agents for now.  Could consider transitioning to Lovenox.   Acute renal failure with hypernatremia and hypokalemia and hypomagnesemia This was most likely due to poor oral intake over the past many days  prior to admission.  This has resolved with IV fluids.  Sodium level is now normal.  Potassium remains low and will be repleted.  Magnesium also noted to be low and will be supplemented.    Mild rhabdomyolysis CK level was noted to be elevated.  Improved with hydration.  Levels have improved.  QT prolongation Avoid QT prolonging medications.  Monitor on telemetry.  Mildly abnormal LFTs Most likely in the setting of rhabdomyolysis.  Hepatitis panel is unremarkable.  Stable.  Patient with known history of cholelithiasis.  However his right upper quadrant is benign on examination.  Do not anticipate any further testing at this time.  History of seizure disorder EEG was done which revealed epileptiform activity.  Unclear if this was clinically significant.  However patient's Dilantin level was subtherapeutic at admission.  After discussions with neurology a loading dose of Dilantin was given.  No repeat EEG per neurology.  Dilantin level is subtherapeutic this morning when adjusted for low albumin.  Currently on intravenous phenytoin.  Macrocytic anemia Drop in hemoglobin is likely dilutional.  No evidence for overt bleeding.  Anemia panel reviewed.  Ferritin 1686.  Folic acid level of 26.  Vitamin B12 level 799.  Hemoglobin is low but stable.  Acute metabolic encephalopathy Patient with known history of mental retardation.  See above under seizure disorder as well.  Patient appears to be improving gradually.  History of mental retardation Patient has poor quality of life at baseline.  Apparently has been declining over the past several months.  Has had falls.  He is now wheelchair-bound.  Allergy medicine is following.  Discussions held with the patient's legal guardian.  He is now DNR.   Patient is not progressing at all.  We will give a trial of antibiotics.  Palliative medicine may need to discuss transitioning to hospice with patient's legal guardian.    DVT Prophylaxis: On IV heparin Code  Status: DNR Family Communication: No family or friends at bedside. Disposition Plan: Management as outlined above.  Prognosis is poor.    LOS: 4 days   [place] [facility] Pager [phone] 10/09/2018, 10:48 AM  If 7PM-7AM, please contact night-coverage at [email or web address], password [facility]

## 2018-10-09 NOTE — Progress Notes (Signed)
Pharmacy Consult Note - Phenytoin  Labs: phenytoin level 6.5, albumin 1.3, corrected phenytoin level 14.4  A/P: Phenytoin level at goal of 10-20 on current dosing of 100mg  IV q8. No changes. Recheck level in 5-7 days if necessary  Adrian Saran, PharmD, BCPS 10/09/2018 9:00 AM

## 2018-10-09 NOTE — Progress Notes (Signed)
[place] for heparin Indication: DVT & PE  Allergies  Allergen Reactions  . Amoxicillin Rash    Unsure wasn't listed on [facility] 01/29/17.  Severe rash Unsure wasn't listed on [facility] 01/29/17.  Severe rash  . Nsaids Other (See Comments)    Avoid NSAIDS--  Strong family hx liver cancer Avoid NSAIDS--  Strong family hx liver cancer   Patient Measurements: Height: 5\' 2"  (157.5 cm) Weight: 114 lb 10.2 oz (52 kg) IBW/kg (Calculated) : 54.6 Heparin Dosing Weight: 52kg  Vital Signs: Temp: 98.2 F (36.8 C) (01/12 0800) Temp Source: Oral (01/12 0800) BP: 70/34 (01/12 0300) Pulse Rate: 65 (01/12 0300)  Labs: Recent Labs    10/06/18 1518  10/07/18 0253  10/08/18 0011  10/08/18 1205 10/08/18 1818 10/09/18 0310  HGB  --    < > 8.3*  --  8.4*  --   --   --  7.8*  HCT  --   --  28.2*  --  27.8*  --   --   --  26.1*  PLT  --   --  207  --  190  --   --   --  194  APTT 34  --   --   --   --   --   --   --   --   LABPROT 17.4*  --   --   --   --   --   --   --   --   INR 1.44  --   --   --   --   --   --   --   --   HEPARINUNFRC  --    < >  --    < > 0.61   < > 0.65 0.72* 0.56  CREATININE  --   --  0.95  --  1.02  --   --   --  0.92  CKTOTAL  --   --   --   --  528*  --   --   --   --    < > = values in this interval not displayed.   Estimated Creatinine Clearance: 59.7 mL/min (by C-G formula based on SCr of 0.92 mg/dL).   Assessment:  62 year old male with a past medical history of mental retardation who lives in a group home who is wheelchair-bound at baseline.  Patient was brought into the hospital due to decreased oral intake for the past 3 days.   Pharmacy has been consulted to dose heparin drip for DVT and PE.  Baseline INR borderline elevated, aPTT WNL  Prior anticoagulation: none   Today, 10/09/2018 8:50 AM   Heparin level therapeutic on current IV heparin rate of 1550 units/hr  CBC - Hgb 7.8, stable. Plts 194 stable.  No reported or  line issues per RN  Goal of Therapy: Heparin level 0.3-0.7 units/ml Monitor platelets by anticoagulation protocol: Yes  Plan: 1) Continue IV heparin at current rate of 1550 units/hr 2) Check confirmatory heparin level today at 1200 3) daily HL/CBC 4) Monitor for bleeding   Adrian Saran, PharmD, BCPS 10/09/2018 8:51 AM

## 2018-10-10 ENCOUNTER — Inpatient Hospital Stay (HOSPITAL_COMMUNITY): Payer: Medicare Other

## 2018-10-10 DIAGNOSIS — M7989 Other specified soft tissue disorders: Secondary | ICD-10-CM

## 2018-10-10 LAB — BLOOD CULTURE ID PANEL (REFLEXED)
Acinetobacter baumannii: NOT DETECTED
CANDIDA KRUSEI: NOT DETECTED
Candida albicans: NOT DETECTED
Candida glabrata: NOT DETECTED
Candida parapsilosis: NOT DETECTED
Candida tropicalis: NOT DETECTED
Enterobacter cloacae complex: NOT DETECTED
Enterobacteriaceae species: NOT DETECTED
Enterococcus species: NOT DETECTED
Escherichia coli: NOT DETECTED
Haemophilus influenzae: NOT DETECTED
Klebsiella oxytoca: NOT DETECTED
Klebsiella pneumoniae: NOT DETECTED
Listeria monocytogenes: NOT DETECTED
Methicillin resistance: NOT DETECTED
Neisseria meningitidis: NOT DETECTED
PROTEUS SPECIES: NOT DETECTED
Pseudomonas aeruginosa: NOT DETECTED
STAPHYLOCOCCUS SPECIES: DETECTED — AB
Serratia marcescens: NOT DETECTED
Staphylococcus aureus (BCID): NOT DETECTED
Streptococcus agalactiae: NOT DETECTED
Streptococcus pneumoniae: NOT DETECTED
Streptococcus pyogenes: NOT DETECTED
Streptococcus species: NOT DETECTED

## 2018-10-10 LAB — CULTURE, BLOOD (ROUTINE X 2)
CULTURE: NO GROWTH
Culture: NO GROWTH
Special Requests: ADEQUATE
Special Requests: ADEQUATE

## 2018-10-10 LAB — CBC
HCT: 29.3 % — ABNORMAL LOW (ref 39.0–52.0)
Hemoglobin: 9 g/dL — ABNORMAL LOW (ref 13.0–17.0)
MCH: 33.1 pg (ref 26.0–34.0)
MCHC: 30.7 g/dL (ref 30.0–36.0)
MCV: 107.7 fL — ABNORMAL HIGH (ref 80.0–100.0)
Platelets: 238 10*3/uL (ref 150–400)
RBC: 2.72 MIL/uL — ABNORMAL LOW (ref 4.22–5.81)
RDW: 14.9 % (ref 11.5–15.5)
WBC: 19.7 10*3/uL — ABNORMAL HIGH (ref 4.0–10.5)
nRBC: 0 % (ref 0.0–0.2)

## 2018-10-10 LAB — BASIC METABOLIC PANEL
Anion gap: 7 (ref 5–15)
BUN: 11 mg/dL (ref 8–23)
CO2: 21 mmol/L — ABNORMAL LOW (ref 22–32)
Calcium: 6.9 mg/dL — ABNORMAL LOW (ref 8.9–10.3)
Chloride: 113 mmol/L — ABNORMAL HIGH (ref 98–111)
Creatinine, Ser: 0.9 mg/dL (ref 0.61–1.24)
GFR calc Af Amer: >60 mL/min (ref >60)
GFR calc non Af Amer: >60 mL/min (ref >60)
Glucose, Bld: 85 mg/dL (ref 70–99)
Potassium: 3.9 mmol/L (ref 3.5–5.1)
SODIUM: 141 mmol/L (ref 135–145)

## 2018-10-10 LAB — HEPARIN LEVEL (UNFRACTIONATED)
HEPARIN UNFRACTIONATED: 0.31 [IU]/mL (ref 0.30–0.70)
Heparin Unfractionated: 0.38 IU/mL (ref 0.30–0.70)

## 2018-10-10 LAB — MAGNESIUM: MAGNESIUM: 2 mg/dL (ref 1.7–2.4)

## 2018-10-10 MED ORDER — CEFAZOLIN SODIUM-DEXTROSE 2-4 GM/100ML-% IV SOLN
2.0000 g | Freq: Three times a day (TID) | INTRAVENOUS | Status: DC
  Administered 2018-10-10 – 2018-10-13 (×8): 2 g via INTRAVENOUS
  Filled 2018-10-10 (×10): qty 100

## 2018-10-10 MED ORDER — POTASSIUM CHLORIDE IN NACL 20-0.9 MEQ/L-% IV SOLN
INTRAVENOUS | Status: DC
  Administered 2018-10-10 – 2018-10-11 (×3): via INTRAVENOUS
  Filled 2018-10-10 (×4): qty 1000

## 2018-10-10 NOTE — Progress Notes (Addendum)
[place] for heparin Indication: DVT & PE  Allergies  Allergen Reactions  . Amoxicillin Rash    Unsure wasn't listed on [facility] 01/29/17.  Severe rash Unsure wasn't listed on [facility] 01/29/17.  Severe rash  . Nsaids Other (See Comments)    Avoid NSAIDS--  Strong family hx liver cancer Avoid NSAIDS--  Strong family hx liver cancer   Patient Measurements: Height: 5\' 2"  (157.5 cm) Weight: 114 lb 10.2 oz (52 kg) IBW/kg (Calculated) : 54.6 Heparin Dosing Weight: 52kg  Vital Signs: Temp: 98.7 F (37.1 C) (01/13 0400) Temp Source: Axillary (01/13 0400) BP: 90/58 (01/13 0600) Pulse Rate: 85 (01/13 0600)  Labs: Recent Labs    10/08/18 0011  10/09/18 0310 10/09/18 1327 10/10/18 0308  HGB 8.4*  --  7.8*  --  9.0*  HCT 27.8*  --  26.1*  --  29.3*  PLT 190  --  194  --  238  HEPARINUNFRC 0.61   < > 0.56 0.69 0.31  CREATININE 1.02  --  0.92  --  0.90  CKTOTAL 528*  --   --   --   --    < > = values in this interval not displayed.   Estimated Creatinine Clearance: 61 mL/min (by C-G formula based on SCr of 0.9 mg/dL).   Assessment:  63 year old male with a past medical history of mental retardation who lives in a group home who is wheelchair-bound at baseline.  Patient was brought into the hospital due to decreased oral intake for the past 3 days.   Pharmacy has been consulted to dose heparin drip for DVT and PE.  Baseline INR borderline elevated, aPTT WNL  Prior anticoagulation: none  Significant events: 1/12 RN reports nose bleeding.  Heparin held for 2 hrs then resumed.  Today, 10/10/2018 :  Heparin level 0.31, therapeutic on current IV heparin rate of 1550 units/hr  CBC - Hgb increased to 9. Plts remain WNL  No reported bleeding or line issues per RN.  No further nose bleeds reported.  SCr 0.9, stable with CrCl ~ 61 ml/min  Goal of Therapy: Heparin level 0.3-0.7 units/ml Monitor platelets by anticoagulation protocol:  Yes  Plan:  Continue IV heparin at current rate of 1550 units/hr  Check confirmatory heparin level today at 1300  Daily HL/CBC  Monitor for bleeding  Follow up plans for long-term anticoagulation; oral if/when consistent PO intake.  Gretta Arab PharmD, BCPS Pager [phone] 10/10/2018 7:10 AM   Addendum:  Heparin level 0.38 remains therapeutic on Heparin at 1550 units/hr. Plan:  Continue heparin IV infusion at 1550 units/hr  Daily heparin level and CBC  Gretta Arab PharmD, BCPS Pager [phone] 10/10/2018 2:43 PM

## 2018-10-10 NOTE — Progress Notes (Signed)
  Speech Language Pathology Treatment: Dysphagia  Patient Details Name: Sean Murray MRN: [id number] DOB: [birth year] Today's Date: 10/10/2018 Time: 9476-5465 SLP Time Calculation (min) (ACUTE ONLY): 20 min  Assessment / Plan / Recommendation Clinical Impression  SLP in for follow up after BSE completed Friday 10/06/2018. Visitors present indicate pt having an "off" day today. Minimal po intake, and not drinking from a straw today. Pt is afebrile, lungs diminished per RN. RN reports tolerance of ice cream earlier. SLP provided education regarding safe swallow and oral care. Visitors report puree/thin liquid diet continues to be appropriate. Pt currently sleeping. Education was primary goal this session, so pt was not awakened or observed with po intake. SLP will continue to follow at bedside for assessment of diet tolerance and education.    HPI HPI: 63 y.o. male admitted on 10/23/2018 with no PO intake for 3 days at his group home [employer]).  He was found to have acute renal failure, hypernatremia, mild rhabdomyolysis, and elevated LFTs.  PMH: Down Syndrome, LBBB, CKD 3 gouty arthritis, bladder mass (2017), MR, progressive dementia and hearing loss. CXR - no active cardiopulmonary disease.      SLP Plan  Continue with current plan of care       Recommendations  Diet recommendations: Dysphagia 1 (puree);Thin liquid Liquids provided via: Straw Medication Administration: Crushed with puree Supervision: Full supervision/cueing for compensatory strategies Compensations: Minimize environmental distractions;Slow rate;Small sips/bites Postural Changes and/or Swallow Maneuvers: Seated upright 90 degrees                Oral Care Recommendations: Oral care QID Follow up Recommendations: 24 hour supervision/assistance SLP Visit Diagnosis: Dysphagia, unspecified (R13.10) Plan: Continue with current plan of care       GO                B. Quentin O[facility], CCC-SLP Speech  Language Pathologist [phone]  Shonna Chock 10/10/2018, 3:09 PM

## 2018-10-10 NOTE — Progress Notes (Signed)
PHARMACY - PHYSICIAN COMMUNICATION CRITICAL VALUE ALERT - BLOOD CULTURE IDENTIFICATION (BCID)  Sean Murray is an 63 y.o. male who presented to [facility] on 10/[facility]/2018 with a chief complaint of decreased oral intake.  He was admitted with acute renal failure, hyponatremia & hypotension.   Assessment: Patient was started on Ancef because of fevers, leukocytosis.  Presumed source is cellulitis.   Name of physician (or Provider) Contacted: Curly Rim  Current antibiotics: Ancef 1gm IV q8h  Changes to prescribed antibiotics recommended:  Increase Ancef 2gm IV q8h   Results for orders placed or performed during the hospital encounter of 10/09/2018  Blood Culture ID Panel (Reflexed) (Collected: 10/09/2018  8:54 AM)  Result Value Ref Range   Enterococcus species NOT DETECTED NOT DETECTED   Listeria monocytogenes NOT DETECTED NOT DETECTED   Staphylococcus species DETECTED (A) NOT DETECTED   Staphylococcus aureus (BCID) NOT DETECTED NOT DETECTED   Methicillin resistance NOT DETECTED NOT DETECTED   Streptococcus species NOT DETECTED NOT DETECTED   Streptococcus agalactiae NOT DETECTED NOT DETECTED   Streptococcus pneumoniae NOT DETECTED NOT DETECTED   Streptococcus pyogenes NOT DETECTED NOT DETECTED   Acinetobacter baumannii NOT DETECTED NOT DETECTED   Enterobacteriaceae species NOT DETECTED NOT DETECTED   Enterobacter cloacae complex NOT DETECTED NOT DETECTED   Escherichia coli NOT DETECTED NOT DETECTED   Klebsiella oxytoca NOT DETECTED NOT DETECTED   Klebsiella pneumoniae NOT DETECTED NOT DETECTED   Proteus species NOT DETECTED NOT DETECTED   Serratia marcescens NOT DETECTED NOT DETECTED   Haemophilus influenzae NOT DETECTED NOT DETECTED   Neisseria meningitidis NOT DETECTED NOT DETECTED   Pseudomonas aeruginosa NOT DETECTED NOT DETECTED   Candida albicans NOT DETECTED NOT DETECTED   Candida glabrata NOT DETECTED NOT DETECTED   Candida krusei NOT DETECTED NOT DETECTED   Candida  parapsilosis NOT DETECTED NOT DETECTED   Candida tropicalis NOT DETECTED NOT DETECTED    Biagio Borg 10/10/2018  3:03 PM

## 2018-10-10 NOTE — Progress Notes (Signed)
[facility] PROGRESS NOTE  Sean Murray OTL:[id number] DOB: 10-05-1955 DOA: 10/07/2018  PCP: [name], MD  Brief History/Interval Summary: 63 year old male with a past medical history of mental retardation who lives in a group home who is wheelchair-bound at baseline.  Her legal guardian who is apparently his sister who lives in [place].  Patient was brought into the hospital due to decreased oral intake for the past 3 days.  Apparently patient has dementia at baseline and has been progressively getting worse over the past many months.  Reason for Visit: Acute renal failure.  Hyponatremia.  Hypotension  Consultants: Palliative medicine  Procedures:   EEG This is an abnormal electroencephalogram secondary to general background slowing and GPEDs.  Also noted are short periods when the epileptiform discharges are more regular at a rate of about 3-4 per second.  Unclear significance but can not rule out subclinical seizure activity.  Clinical correlation recommended.  Transthoracic echocardiogram Study Conclusions  - Left ventricle: Systolic function was mildly to moderately   reduced. The estimated ejection fraction was in the range of 40%   to 45%. Cannot exclude hypokinesis of the anteroseptal, anterior,   and anterolateral myocardium. - Ventricular septum: Septal motion showed dyssynergy. These   changes are consistent with a left bundle branch block. - Impressions: Incomplete sutdy as patient terminated study prior   to exam completion. On available images EF appears to be in the   40-45% range with hypokinesis of the anterior wall, aanteroseptum   and anterolateral walls. As there are no 2-chamber images hard to   tell if this is all due to LBBB or if there is also a regional   wall motion abnormlaity due to previous infarct.  Impressions:  - Incomplete sutdy as patient terminated study prior to exam   completion. On available images EF appears to be  in the 40-45%   range with hypokinesis of the anterior wall, aanteroseptum and   anterolateral walls. As there are no 2-chamber images hard to   tell if this is all due to LBBB or if there is also a regional   wall motion abnormlaity due to previous infarct.  Antibiotics: None  Subjective/Interval History: Patient lying in the bed with his eyes open.  Does not really communicate as has been the case for the past many days.    ROS: Unable to do due to his mental retardation  Objective:  Vital Signs  Vitals:   10/10/18 0300 10/10/18 0400 10/10/18 0500 10/10/18 0600  BP: 111/74 (!) 84/45 (!) 90/44 (!) 90/58  Pulse: (!) 101 86 80 85  Resp: 19 20 (!) 23 18  Temp: 99.7 F (37.6 C) 98.7 F (37.1 C)    TempSrc: Axillary Axillary    SpO2: 97% 97% 97% 94%  Weight:      Height:        Intake/Output Summary (Last 24 hours) at 10/10/2018 0855 Last data filed at 10/10/2018 0616 Gross per 24 hour  Intake 1877.22 ml  Output 1500 ml  Net 377.22 ml   Filed Weights   10/06/18 0700  Weight: 52 kg   General appearance: Awake alert.  Does not communicate.  Distracted. Resp: Normal effort is noted.  Diminished air entry at the bases.  Otherwise clear to auscultation.  No crackles or wheezing. Cardio: S1-S2 is normal regular.  No S3-S4.  No rubs murmurs or bruit GI: Abdomen is soft.  Nontender nondistended.  Bowel sounds are present normal.  No masses  organomegaly Extremities: Stable right upper extremity swelling at the antecubital fossa.  Not as erythematous as yesterday.  Good radial pulses. Neurologic: Distracted.  Moving all his extremities.   Lab Results:  Data Reviewed: I have personally reviewed following labs and imaging studies  CBC: Recent Labs  Lab 10/14/2018 1651 10/06/18 0248 10/07/18 0253 10/08/18 0011 10/09/18 0310 10/10/18 0308  WBC 13.2* 9.1 8.2 7.0 13.5* 19.7*  NEUTROABS 10.0*  --   --   --   --   --   HGB 10.9* 8.8* 8.3* 8.4* 7.8* 9.0*  HCT 36.1* 30.5* 28.2*  27.8* 26.1* 29.3*  MCV 109.1* 112.5* 110.2* 108.2* 108.3* 107.7*  PLT 279 228 207 190 194 017    Basic Metabolic Panel: Recent Labs  Lab 10/11/2018 1903  10/06/18 0248 10/07/18 0253 10/08/18 0011 10/09/18 0310 10/10/18 0308  NA  --    < > 150* 150* 147* 142 141  K  --    < > 3.7 3.6 3.1* 3.2* 3.9  CL  --    < > 121* 124* 120* 116* 113*  CO2  --    < > 20* 20* 22 21* 21*  GLUCOSE  --    < > 142* 133* 155* 118* 85  BUN  --    < > 49* 22 14 11 11   CREATININE  --    < > 1.24 0.95 1.02 0.92 0.90  CALCIUM  --    < > 7.5* 7.1* 6.9* 6.6* 6.9*  MG 2.5*  --  2.1  --   --  1.5* 2.0  PHOS 5.0*  --  3.4  --   --   --   --    < > = values in this interval not displayed.    GFR: Estimated Creatinine Clearance: 61 mL/min (by C-G formula based on SCr of 0.9 mg/dL).  Liver Function Tests: Recent Labs  Lab 10/22/2018 1651 10/06/18 0248 10/07/18 0253 10/08/18 0011 10/09/18 0310  AST 78* 57* 64* 79* 50*  ALT 54* 40 40 46* 37  ALKPHOS 101 69 67 103 108  BILITOT 0.4 0.5 0.5 0.4 0.3  PROT 7.1 5.3* 4.7* 4.6* 4.1*  ALBUMIN 2.6* 1.9* 1.6* 1.6* 1.3*     Recent Labs  Lab 10/12/2018 1651  AMMONIA 16    Coagulation Profile: Recent Labs  Lab 10/12/2018 1731 10/06/18 1518  INR 1.43 1.44    Cardiac Enzymes: Recent Labs  Lab 10/04/2018 1903 10/28/2018 2106 10/06/18 0248 10/08/18 0011  CKTOTAL 898*  --  728* 528*  TROPONINI  --  <0.03  --   --     CBG: Recent Labs  Lab 10/24/2018 1645  GLUCAP 125*     Recent Results (from the past 240 hour(s))  Urine culture     Status: None   Collection Time: 10/01/2018  3:32 PM  Result Value Ref Range Status   Specimen Description   Final    URINE, CATHETERIZED Performed at [facility], [place] [address] [place], [place] [address]    Special Requests   Final    NONE Performed at [facility], [place] [address] [place], [place] [address]    Culture   Final    NO GROWTH Performed at [facility],  [place] [address] [place], [place] [address]    Report Status 10/07/2018 FINAL  Final  Blood Culture (routine x 2)     Status: None   Collection Time: 10/14/2018  4:50 PM  Result  Value Ref Range Status   Specimen Description   Final    BLOOD RIGHT ARM Performed at [place] [address], [place], [place] [address]    Special Requests   Final    BOTTLES DRAWN AEROBIC AND ANAEROBIC Blood Culture adequate volume   Culture   Final    NO GROWTH 5 DAYS Performed at [facility], [place] [address], [place], [place] [address]    Report Status 10/10/2018 FINAL  Final  Blood Culture (routine x 2)     Status: None   Collection Time: 10/02/2018  5:06 PM  Result Value Ref Range Status   Specimen Description BLOOD BLOOD LEFT FOREARM  Final   Special Requests   Final    BOTTLES DRAWN AEROBIC AND ANAEROBIC Blood Culture adequate volume Performed at [place] [address] [place], [place] [address]    Culture   Final    NO GROWTH 5 DAYS Performed at [facility], [place] [address], [place], [place] [address]    Report Status 10/10/2018 FINAL  Final  MRSA PCR Screening     Status: None   Collection Time: 10/13/2018  8:57 PM  Result Value Ref Range Status   MRSA by PCR NEGATIVE NEGATIVE Final    Comment:        The GeneXpert MRSA Assay (FDA approved for NASAL specimens only), is one component of a comprehensive MRSA colonization surveillance program. It is not intended to diagnose MRSA infection nor to guide or monitor treatment for MRSA infections. Performed at [facility], [place] [address], [place], [place] [address]   Culture, blood (Routine X 2) w Reflex to ID Panel     Status: None (Preliminary result)   Collection Time: 10/09/18  8:54 AM  Result Value Ref Range Status   Specimen Description   Final    BLOOD RIGHT HAND Performed at [place] [address], [place], [place] [address]    Special  Requests   Final    BOTTLES DRAWN AEROBIC ONLY Blood Culture adequate volume Performed at [place] [address] [place], [place] [address]    Culture   Final    NO GROWTH < 24 HOURS Performed at [place] [address] [place], [place] [address]    Report Status PENDING  Incomplete  Culture, blood (Routine X 2) w Reflex to ID Panel     Status: None (Preliminary result)   Collection Time: 10/09/18  8:54 AM  Result Value Ref Range Status   Specimen Description   Final    BLOOD RIGHT ARM Performed at [place] [address] [place], [place] [address]    Special Requests   Final    BOTTLES DRAWN AEROBIC ONLY Blood Culture results may not be optimal due to an inadequate volume of blood received in culture bottles Performed at [place] [address], [place], [place] [address]    Culture   Final    NO GROWTH < 24 HOURS Performed at [place] [address] [place],  [address]    Report Status PENDING  Incomplete      Radiology Studies: Vas [place] Upper Extremity Venous Duplex  Result Date: 10/10/2018 UPPER VENOUS STUDY  Indications: Swelling Performing Technologist: Oliver Hum RVT  Examination Guidelines: A complete evaluation includes B-mode imaging, spectral Doppler, color Doppler, and power Doppler as needed of all  accessible portions of each vessel. Bilateral testing is considered an integral part of a complete examination. Limited examinations for reoccurring indications may be performed as noted.  Right Findings: +----------+------------+----------+---------+-----------+-------+ RIGHT     CompressiblePropertiesPhasicitySpontaneousSummary +----------+------------+----------+---------+-----------+-------+ IJV           Full                 Yes       Yes            +----------+------------+----------+---------+-----------+-------+ Subclavian    Full                  Yes       Yes            +----------+------------+----------+---------+-----------+-------+ Axillary      Full                 Yes       Yes            +----------+------------+----------+---------+-----------+-------+ Brachial      Full                 Yes       Yes            +----------+------------+----------+---------+-----------+-------+ Radial        Full                                          +----------+------------+----------+---------+-----------+-------+ Ulnar         Full                                          +----------+------------+----------+---------+-----------+-------+ Cephalic      Full                                          +----------+------------+----------+---------+-----------+-------+ Basilic       None                                   Acute  +----------+------------+----------+---------+-----------+-------+  Left Findings: +----------+------------+----------+---------+-----------+-------+ LEFT      CompressiblePropertiesPhasicitySpontaneousSummary +----------+------------+----------+---------+-----------+-------+ Subclavian    Full                 Yes       Yes            +----------+------------+----------+---------+-----------+-------+  Summary:  Right: No evidence of deep vein thrombosis in the upper extremity. Findings consistent with acute superficial vein thrombosis involving the right basilic vein.  Left: No evidence of thrombosis in the subclavian.  *See table(s) above for measurements and observations.    Preliminary      Medications:  Scheduled: . allopurinol  300 mg Oral q morning - 03T  . folic acid  1 mg Intravenous Daily  . magic mouthwash w/lidocaine  10 mL Oral QID  . mouth rinse  15 mL Mouth Rinse BID  . midodrine  10 mg Oral TID WC  . mupirocin ointment   Topical BID  . oxymetazoline  2 spray Each Nare BID  . phenytoin (DILANTIN) IV  100 mg Intravenous Q8H  . senna  1 tablet Oral  BID  .  thiamine injection  100 mg Intravenous Daily   Continuous: . 0.9 % NaCl with KCl 20 mEq / L 50 mL/hr at 10/10/18 0758  .  ceFAZolin (ANCEF) IV Stopped (10/10/18 4132)  . heparin 1,550 Units/hr (10/09/18 1949)   GMW:[id number] **OR** acetaminophen, bisacodyl, diphenhydrAMINE, HYDROcodone-acetaminophen    Assessment/Plan:  Fever most likely secondary to cellulitis versus thrombophlebitis of the right upper extremity Patient had an IV access in the right upper extremity which infiltrated.  Doppler shows acute superficial thrombophlebitis.  Since patient was febrile and had leukocytosis he was started on Ancef.  MRSA PCR was negative.  Lactic acid levels were normal.  Continue to monitor for now.   Hypotension, etiology unclear Apparently patient has some degree of chronic hypotension.  Old records reviewed.  Blood pressure has never been this low however.  Patient not noted to be on any antihypertensives.  Flomax was discontinued.  Lactic acid level was normal.  Cortisol level was normal.  Patient was placed on midodrine.  Echocardiogram shows normal systolic function.  Patient has not improved despite multiple days of IV fluids.  His prognosis is poor at this time.  We will see if there is any improvement with use of antibiotics.    Acute pulmonary embolism and lower extremity DVT Patient was found to have acute DVT in the lower extremity.  CT scan of the chest was done which revealed pulmonary embolism without any evidence for heart strain.  Continue IV heparin for now.  His oral intake is still inconsistent so unable to use oral agents for now.  Patient did have brief episode of epistaxis yesterday which appears to have resolved with Afrin nasal spray.  Hemoglobin is stable.  Acute renal failure with hypernatremia and hypokalemia and hypomagnesemia This was most likely due to poor oral intake over the past many days prior to admission.  Renal function is now normal.  Sodium and potassium  levels have improved.  Magnesium is normal.     Mild rhabdomyolysis CK level was noted to be elevated.  Improved with hydration.  Levels have improved.  QT prolongation Avoid QT prolonging medications.  Monitor on telemetry.  Mildly abnormal LFTs Most likely in the setting of rhabdomyolysis.  Hepatitis panel is unremarkable.  Stable.  Patient with known history of cholelithiasis.  However his right upper quadrant is benign on examination.  Do not anticipate any further testing at this time.  History of seizure disorder EEG was done which revealed epileptiform activity.  Unclear if this was clinically significant.  However patient's Dilantin level was subtherapeutic at admission.  After discussions with neurology a loading dose of Dilantin was given.  No repeat EEG per neurology.  Dilantin level was therapeutic yesterday when adjusted for low albumin.  Continue current dose of phenytoin.  Macrocytic anemia Drop in hemoglobin is likely dilutional.  No evidence for overt bleeding.  Anemia panel reviewed.  Ferritin 1686.  Folic acid level of 26.  Vitamin B12 level 799.  Hemoglobin is low but stable.  Acute metabolic encephalopathy Patient with known history of mental retardation.  See above under seizure disorder as well.  Patient's mentation waxing and waning.  Hypoalbuminemia/severe protein calorie malnutrition Oral intake remains very poor.  History of mental retardation Patient has poor quality of life at baseline.  Apparently has been declining over the past several months.  Has had falls.  He is now wheelchair-bound.  Palliative medicine is following.  Discussions held with the patient's legal guardian.  He is now DNR.   Patient has not shown much improvement despite maximal treatment over the last few days.  His oral intake remains very poor.  Palliative medicine may need to discuss transitioning to hospice with patient's legal guardian.    DVT Prophylaxis: On IV heparin Code Status:  DNR Family Communication: No family or friends at bedside. Disposition Plan: Management as outlined above.  Prognosis is poor.  Await further palliative medicine input.    LOS: 5 days   [facility] Pager [phone] 10/10/2018, 8:55 AM  If 7PM-7AM, please contact night-coverage at [email or web address], password [facility]

## 2018-10-10 NOTE — Progress Notes (Signed)
Right upper extremity venous duplex has been completed. Negative for DVT. There is evidence of acute superficial vein thrombosis involving the basilic vein of the right upper extremity. Results were given to the patient's nurse, Baker Janus.  10/10/18 8:44 AM Sean Murray RVT

## 2018-10-10 NOTE — Progress Notes (Signed)
Daily Progress Note   Patient Name: Sean Murray       Date: 10/10/2018 DOB: [birth year]  Age: 63 y.o. MRN#: [id number] Attending Physician: Bonnielee Haff, MD Primary Care Physician: [name], MD Admit Date: 10/14/2018  Reason for Consultation/Follow-up: Establishing goals of care  Subjective:  patient is awake and alert. He is able to track me in the room. He is more responsive. He also answers appropriately, using few words.   Close friend Tammy and Tammy's daughter are present at the bedside.   See below.   Length of Stay: 5  Current Medications: Scheduled Meds:  . allopurinol  300 mg Oral q morning - 58N  . folic acid  1 mg Intravenous Daily  . magic mouthwash w/lidocaine  10 mL Oral QID  . mouth rinse  15 mL Mouth Rinse BID  . midodrine  10 mg Oral TID WC  . mupirocin ointment   Topical BID  . oxymetazoline  2 spray Each Nare BID  . phenytoin (DILANTIN) IV  100 mg Intravenous Q8H  . senna  1 tablet Oral BID  . thiamine injection  100 mg Intravenous Daily    Continuous Infusions: . 0.9 % NaCl with KCl 20 mEq / L 50 mL/hr at 10/10/18 1200  .  ceFAZolin (ANCEF) IV Stopped (10/10/18 2778)  . heparin 1,550 Units/hr (10/10/18 1200)    PRN Meds: acetaminophen **OR** acetaminophen, bisacodyl, diphenhydrAMINE, HYDROcodone-acetaminophen  Physical Exam         Awake alert He eats ice cream with his bedside RN Tracks me in the room Regular Abdomen is not distended Has some edema S 1 S 2  Has low BP  Vital Signs: BP 94/70   Pulse 77   Temp 99.5 F (37.5 C) (Axillary)   Resp 16   Ht 5\' 2"  (1.575 m)   Wt 52 kg   SpO2 100%   BMI 20.97 kg/m  SpO2: SpO2: 100 % O2 Device: O2 Device: Room Air O2 Flow Rate: O2 Flow Rate (L/min): 2 L/min  Intake/output summary:    Intake/Output Summary (Last 24 hours) at 10/10/2018 1436 Last data filed at 10/10/2018 1200 Gross per 24 hour  Intake 2173.7 ml  Output 600 ml  Net 1573.7 ml   LBM: Last BM Date: 10/10/18 Baseline Weight: Weight: 52 kg Most recent weight: Weight:  52 kg      PPS 40% Palliative Assessment/Data:    Flowsheet Rows     Most Recent Value  Intake Tab  Referral Department  Hospitalist  Unit at Time of Referral  ER  Date Notified  10/06/18  Palliative Care Type  New Palliative care  Reason for referral  Clarify Goals of Care  Date of Admission  10/25/2018  # of days IP prior to Palliative referral  1  Clinical Assessment  Psychosocial & Spiritual Assessment  Palliative Care Outcomes      Patient Active Problem List   Diagnosis Date Noted  . Pressure injury of skin 10/06/2018  . DNR (do not resuscitate)   . Dementia associated with other underlying disease without behavioral disturbance ([place])   . Palliative care encounter   . Acute renal failure (ARF) ([place]) 09/29/2018  . Dehydration 10/22/2018  . QT prolongation 10/19/2018  . Hypernatremia 10/21/2018  . Down's syndrome 04/13/2017  . Memory disorder 04/13/2017  . HOH (hard of hearing) 04/13/2017  . Generalized abdominal pain 12/23/2016  . Abnormal findings-gastrointestinal tract 12/23/2016    Palliative Care Assessment & Plan   Patient Profile:    Assessment:  63 yo gentleman from group home, has MR, admitted with renal failure, generalized weakness, poor PO intake, oral ulcers.  PMT following for symptom management and goals of care discussions.    Recommendations/Plan:   patient seen and examined, call placed and discussed with sister legal guardian Fenton Malling at [phone].  The patient is one of 8 siblings, 4 are deceased now, sister [name] is his court appointed guardian. The patient was being cared for at his sister's home, how ever, sister died of cancer, he was then placed in a group home where he has lived  since 2017.   The patient has refused to eat/drink over the weekend.   Call placed and discussed with sister [name]: She states that she visited with the patient on Sunday 10-09-2018. The patient stayed awake and ate some. He does better when people he know are visiting with him and are feeding him.   We reviewed his current condition and his overall health. Goals, wishes and values explored.   Sister wants patient to be moved closer to [place], or [place] so as to be closer to her. His other sister and brother also live over there.   Continue current mode of care. CSW follow up requested to initiate search for SNF near legal guardian.    Code Status:    Code Status Orders  (From admission, onward)         Start     Ordered   10/06/18 1446  Do not attempt resuscitation (DNR)  Continuous    Question Answer Comment  In the event of cardiac or respiratory ARREST Do not call a "code blue"   In the event of cardiac or respiratory ARREST Do not perform Intubation, CPR, defibrillation or ACLS   In the event of cardiac or respiratory ARREST Use medication by any route, position, wound care, and other measures to relive pain and suffering. May use oxygen, suction and manual treatment of airway obstruction as needed for comfort.   Comments Per Guardian Ellen Allen.  DNR but full scope treatment otherwise.      01 /09/20 1446        Code Status History    Date Active Date Inactive Code Status Order ID Comments User Context   10/12/2018 2055 10/06/2018 1446 Full Code [id number]  Toy Baker, MD Inpatient       Prognosis:   Unable to determine  Discharge Planning:  SNF with palliative care following,near [place], [place] as per his guardian sister Noralee Space preference.   Care plan was discussed with patient's court appointed guardian Fenton Malling sister at [phone].   Thank you for allowing the Palliative Medicine Team to assist in the care of this patient.   Time In:  1400 Time Out: 1435 Total Time 35 Prolonged Time Billed  no       Greater than 50%  of this time was spent counseling and coordinating care related to the above assessment and plan.  Loistine Chance, MD [phone] Please contact Palliative Medicine Team phone at [phone] for questions and concerns.

## 2018-10-11 LAB — BASIC METABOLIC PANEL
Anion gap: 6 (ref 5–15)
BUN: 9 mg/dL (ref 8–23)
CO2: 20 mmol/L — ABNORMAL LOW (ref 22–32)
Calcium: 6.9 mg/dL — ABNORMAL LOW (ref 8.9–10.3)
Chloride: 112 mmol/L — ABNORMAL HIGH (ref 98–111)
Creatinine, Ser: 0.9 mg/dL (ref 0.61–1.24)
GFR calc Af Amer: >60 mL/min (ref >60)
GFR calc non Af Amer: >60 mL/min (ref >60)
Glucose, Bld: 96 mg/dL (ref 70–99)
Potassium: 3.8 mmol/L (ref 3.5–5.1)
Sodium: 138 mmol/L (ref 135–145)

## 2018-10-11 LAB — CBC
HCT: 27.4 % — ABNORMAL LOW (ref 39.0–52.0)
Hemoglobin: 8.3 g/dL — ABNORMAL LOW (ref 13.0–17.0)
MCH: 32.3 pg (ref 26.0–34.0)
MCHC: 30.3 g/dL (ref 30.0–36.0)
MCV: 106.6 fL — ABNORMAL HIGH (ref 80.0–100.0)
Platelets: 280 10*3/uL (ref 150–400)
RBC: 2.57 MIL/uL — ABNORMAL LOW (ref 4.22–5.81)
RDW: 14.9 % (ref 11.5–15.5)
WBC: 14.8 10*3/uL — ABNORMAL HIGH (ref 4.0–10.5)
nRBC: 0 % (ref 0.0–0.2)

## 2018-10-11 LAB — HEPARIN LEVEL (UNFRACTIONATED): Heparin Unfractionated: 0.44 IU/mL (ref 0.30–0.70)

## 2018-10-11 MED ORDER — OXYMETAZOLINE HCL 0.05 % NA SOLN
2.0000 | Freq: Two times a day (BID) | NASAL | Status: DC | PRN
  Filled 2018-10-11: qty 15

## 2018-10-11 MED ORDER — ADULT MULTIVITAMIN W/MINERALS CH
1.0000 | ORAL_TABLET | Freq: Every day | ORAL | Status: DC
  Administered 2018-10-11 – 2018-10-13 (×3): 1 via ORAL
  Filled 2018-10-11 (×3): qty 1

## 2018-10-11 NOTE — Progress Notes (Signed)
Physical Therapy Treatment Patient Details Name: Sean Murray MRN: [id number] DOB: 29-Sep-1955 Today's Date: 10/11/2018    History of Present Illness pt was admitted with acute rental failure/dehydration.  Has DVT/PE.   H/O MR, seizures    PT Comments    The patient tolerated sitting at the bed edge, more awake today. Demonstrates very little  Sitting balance, leans to the  Right.  Continue PT efforts.   Follow Up Recommendations  SNF     Equipment Recommendations  None recommended by PT    Recommendations for Other Services       Precautions / Restrictions Precautions Precautions: Fall Restrictions Weight Bearing Restrictions: No    Mobility  Bed Mobility Overal bed mobility: Needs Assistance Bed Mobility: Supine to Sit;Sit to Supine;Rolling Rolling: Max assist;+2 for physical assistance;+2 for safety/equipment   Supine to sit: +2 for physical assistance;Total assist Sit to supine: +2 for physical assistance;Total assist   General bed mobility comments: patient in fetal flexed position. Assisted to back and stretched both legs out. Then assisted to sitting at bed edge.  Transfers                 General transfer comment: unable, did make an attempt to stand but patient did not clear. may be safer to try from a recliner.  Ambulation/Gait                 Stairs             Wheelchair Mobility    Modified Rankin (Stroke Patients Only)       Balance Overall balance assessment: Needs assistance Sitting-balance support: Single extremity supported   Sitting balance - Comments: supporting with LUE only , listing to the right. At times, gains his  balance briefly.                                    Cognition Arousal/Alertness: Awake/alert Behavior During Therapy: WFL for tasks assessed/performed                                   General Comments: MR, patient was awakwe for mobility, does not really follow  directions      Exercises      General Comments        Pertinent Vitals/Pain Faces Pain Scale: Hurts even more Pain Location: LE when moved out of full flexion first time on right Pain Descriptors / Indicators: Moaning Pain Intervention(s): Monitored during session;Limited activity within patient's tolerance    Home Living                      Prior Function            PT Goals (current goals can now be found in the care plan section) Progress towards PT goals: Progressing toward goals    Frequency    Min 2X/week      PT Plan Current plan remains appropriate    Co-evaluation              AM-PAC PT "6 Clicks" Mobility   Outcome Measure  Help needed turning from your back to your side while in a flat bed without using bedrails?: Total Help needed moving from lying on your back to sitting on the side of a flat bed without using bedrails?: Total Help needed  moving to and from a bed to a chair (including a wheelchair)?: Total Help needed standing up from a chair using your arms (e.g., wheelchair or bedside chair)?: Total Help needed to walk in hospital room?: Total Help needed climbing 3-5 steps with a railing? : Total 6 Click Score: 6    End of Session   Activity Tolerance: Patient tolerated treatment well Patient left: in bed;with call bell/phone within reach;with bed alarm set Nurse Communication: Mobility status PT Visit Diagnosis: Muscle weakness (generalized) (M62.81);Difficulty in walking, not elsewhere classified (R26.2)     Time: 5366-4403 PT Time Calculation (min) (ACUTE ONLY): 25 min  Charges:  $Neuromuscular Re-education: 23-37 mins                     Tresa Endo PT Acute Rehabilitation Services Pager [phone] Office [phone]    Claretha Cooper 10/11/2018, 3:41 PM

## 2018-10-11 NOTE — Progress Notes (Signed)
[facility] PROGRESS NOTE  Sean Murray HWE:[id number] DOB: January 15, 1956 DOA: 10/01/2018  PCP: [name], MD  Brief History/Interval Summary: 63 year old male with a past medical history of mental retardation who lives in a group home who is wheelchair-bound at baseline.  Her legal guardian who is apparently his sister who lives in [place].  Patient was brought into the hospital due to decreased oral intake for the past 3 days.  Apparently patient has dementia at baseline and has been progressively getting worse over the past many months.  Reason for Visit: Right upper extremity cellulitis.  Acute superficial thrombophlebitis.  Hypotension.  Consultants: Palliative medicine  Procedures:   EEG This is an abnormal electroencephalogram secondary to general background slowing and GPEDs.  Also noted are short periods when the epileptiform discharges are more regular at a rate of about 3-4 per second.  Unclear significance but can not rule out subclinical seizure activity.  Clinical correlation recommended.  Transthoracic echocardiogram Study Conclusions  - Left ventricle: Systolic function was mildly to moderately   reduced. The estimated ejection fraction was in the range of 40%   to 45%. Cannot exclude hypokinesis of the anteroseptal, anterior,   and anterolateral myocardium. - Ventricular septum: Septal motion showed dyssynergy. These   changes are consistent with a left bundle branch block. - Impressions: Incomplete sutdy as patient terminated study prior   to exam completion. On available images EF appears to be in the   40-45% range with hypokinesis of the anterior wall, aanteroseptum   and anterolateral walls. As there are no 2-chamber images hard to   tell if this is all due to LBBB or if there is also a regional   wall motion abnormlaity due to previous infarct.  Impressions:  - Incomplete sutdy as patient terminated study prior to exam   completion.  On available images EF appears to be in the 40-45%   range with hypokinesis of the anterior wall, aanteroseptum and   anterolateral walls. As there are no 2-chamber images hard to   tell if this is all due to LBBB or if there is also a regional   wall motion abnormlaity due to previous infarct.  Antibiotics: None  Subjective/Interval History: Patient noted to be awake alert.  According to nursing staff he had a better day yesterday.  He was awake most of the day.  He also managed to eat and take his medications.  Does not really communicate any due to his history of mental retardation.  ROS: Unable to do due to his mental retardation  Objective:  Vital Signs  Vitals:   10/11/18 0000 10/11/18 0332 10/11/18 0400 10/11/18 0600  BP: (!) 106/57  (!) 103/54 93/65  Pulse:   83 80  Resp: (!) 23  17 18   Temp:  98.4 F (36.9 C)    TempSrc:  Axillary    SpO2:   94% 98%  Weight:      Height:        Intake/Output Summary (Last 24 hours) at 10/11/2018 0809 Last data filed at 10/11/2018 0554 Gross per 24 hour  Intake 744.18 ml  Output 1600 ml  Net -855.82 ml   Filed Weights   10/06/18 0700  Weight: 52 kg   General appearance: Awake alert.  In no distress.  Does not communicate much Resp: Clear to auscultation bilaterally.  Normal effort Cardio: S1-S2 is normal regular.  No S3-S4.  No rubs murmurs or bruit GI: Abdomen is soft.  Nontender nondistended.  Bowel sounds are present normal.  No masses organomegaly Extremities: Right arm swelling is stable.  Not as erythematous or warm to touch.  Good radial pulses. Neurologic: Alert.  Moving all his extremities.    Lab Results:  Data Reviewed: I have personally reviewed following labs and imaging studies  CBC: Recent Labs  Lab 10/04/2018 1651  10/07/18 0253 10/08/18 0011 10/09/18 0310 10/10/18 0308 10/11/18 0236  WBC 13.2*   < > 8.2 7.0 13.5* 19.7* 14.8*  NEUTROABS 10.0*  --   --   --   --   --   --   HGB 10.9*   < > 8.3* 8.4*  7.8* 9.0* 8.3*  HCT 36.1*   < > 28.2* 27.8* 26.1* 29.3* 27.4*  MCV 109.1*   < > 110.2* 108.2* 108.3* 107.7* 106.6*  PLT 279   < > 207 190 194 238 280   < > = values in this interval not displayed.    Basic Metabolic Panel: Recent Labs  Lab 10/09/2018 1903  10/06/18 0248 10/07/18 0253 10/08/18 0011 10/09/18 0310 10/10/18 0308 10/11/18 0236  NA  --    < > 150* 150* 147* 142 141 138  K  --    < > 3.7 3.6 3.1* 3.2* 3.9 3.8  CL  --    < > 121* 124* 120* 116* 113* 112*  CO2  --    < > 20* 20* 22 21* 21* 20*  GLUCOSE  --    < > 142* 133* 155* 118* 85 96  BUN  --    < > 49* 22 14 11 11 9   CREATININE  --    < > 1.24 0.95 1.02 0.92 0.90 0.90  CALCIUM  --    < > 7.5* 7.1* 6.9* 6.6* 6.9* 6.9*  MG 2.5*  --  2.1  --   --  1.5* 2.0  --   PHOS 5.0*  --  3.4  --   --   --   --   --    < > = values in this interval not displayed.    GFR: Estimated Creatinine Clearance: 61 mL/min (by C-G formula based on SCr of 0.9 mg/dL).  Liver Function Tests: Recent Labs  Lab 10/23/2018 1651 10/06/18 0248 10/07/18 0253 10/08/18 0011 10/09/18 0310  AST 78* 57* 64* 79* 50*  ALT 54* 40 40 46* 37  ALKPHOS 101 69 67 103 108  BILITOT 0.4 0.5 0.5 0.4 0.3  PROT 7.1 5.3* 4.7* 4.6* 4.1*  ALBUMIN 2.6* 1.9* 1.6* 1.6* 1.3*     Recent Labs  Lab 10/17/2018 1651  AMMONIA 16    Coagulation Profile: Recent Labs  Lab 10/21/2018 1731 10/06/18 1518  INR 1.43 1.44    Cardiac Enzymes: Recent Labs  Lab 10/17/2018 1903 10/14/2018 2106 10/06/18 0248 10/08/18 0011  CKTOTAL 898*  --  728* 528*  TROPONINI  --  <0.03  --   --     CBG: Recent Labs  Lab 10/28/2018 1645  GLUCAP 125*     Recent Results (from the past 240 hour(s))  Urine culture     Status: None   Collection Time: 10/04/2018  3:32 PM  Result Value Ref Range Status   Specimen Description   Final    URINE, CATHETERIZED Performed at [facility], [place] [address] [place], [place] [address]    Special Requests   Final     NONE Performed at [facility], [place] [address] [place],  [place] [address]    Culture   Final    NO GROWTH Performed at [facility], [place] [address] [place], [place] [address]    Report Status 10/07/2018 FINAL  Final  Blood Culture (routine x 2)     Status: None   Collection Time: 10/17/2018  4:50 PM  Result Value Ref Range Status   Specimen Description   Final    BLOOD RIGHT ARM Performed at [place] [address] [place], [place] [address]    Special Requests   Final    BOTTLES DRAWN AEROBIC AND ANAEROBIC Blood Culture adequate volume   Culture   Final    NO GROWTH 5 DAYS Performed at [facility], [place] [address], [place], [place] [address]    Report Status 10/10/2018 FINAL  Final  Blood Culture (routine x 2)     Status: None   Collection Time: 10/13/2018  5:06 PM  Result Value Ref Range Status   Specimen Description BLOOD BLOOD LEFT FOREARM  Final   Special Requests   Final    BOTTLES DRAWN AEROBIC AND ANAEROBIC Blood Culture adequate volume Performed at [place] [address], [place], [place] [address]    Culture   Final    NO GROWTH 5 DAYS Performed at [facility], [place] [address], [place], [place] [address]    Report Status 10/10/2018 FINAL  Final  MRSA PCR Screening     Status: None   Collection Time: 10/04/2018  8:57 PM  Result Value Ref Range Status   MRSA by PCR NEGATIVE NEGATIVE Final    Comment:        The GeneXpert MRSA Assay (FDA approved for NASAL specimens only), is one component of a comprehensive MRSA colonization surveillance program. It is not intended to diagnose MRSA infection nor to guide or monitor treatment for MRSA infections. Performed at [facility], [place] [address], [place], [place] [address]   Culture, blood (Routine X 2) w Reflex to ID Panel     Status: None (Preliminary result)   Collection Time: 10/09/18  8:54 AM  Result Value Ref  Range Status   Specimen Description   Final    BLOOD RIGHT HAND Performed at [place] [address] [place], [place] [address]    Special Requests   Final    BOTTLES DRAWN AEROBIC ONLY Blood Culture adequate volume Performed at [place] [address] [place], [place] [address]    Culture   Final    NO GROWTH < 24 HOURS Performed at [facility] [address], [place], [place] [address]    Report Status PENDING  Incomplete  Culture, blood (Routine X 2) w Reflex to ID Panel     Status: None (Preliminary result)   Collection Time: 10/09/18  8:54 AM  Result Value Ref Range Status   Specimen Description BLOOD RIGHT ARM  Final   Special Requests   Final    BOTTLES DRAWN AEROBIC ONLY Blood Culture results may not be optimal due to an inadequate volume of blood received in culture bottles Performed at [facility], [place] [address] [place],  [address]    Culture  Setup Time   Final    GRAM POSITIVE COCCI AEROBIC BOTTLE ONLY Organism ID to follow CRITICAL RESULT CALLED TO, READ BACK BY AND VERIFIED WITH: Shelda Jakes PharmD 14:55 10/10/18 ([email or web address]) Performed at [facility], [address], [place], [place] [address]    Culture GRAM POSITIVE COCCI  Final   Report Status PENDING  Incomplete  Blood Culture ID Panel (Reflexed)     Status: Abnormal   Collection Time: 10/09/18  8:54 AM  Result Value Ref Range Status   Enterococcus species NOT DETECTED NOT DETECTED Final   Listeria monocytogenes NOT DETECTED NOT DETECTED Final   Staphylococcus species DETECTED (A) NOT DETECTED Final    Comment: Methicillin (oxacillin) susceptible coagulase negative staphylococcus. Possible blood culture contaminant (unless isolated from more than one blood culture draw or clinical case suggests pathogenicity). No antibiotic treatment is indicated for blood  culture contaminants. CRITICAL RESULT CALLED TO, READ BACK BY AND  VERIFIED WITH: Shelda Jakes PharmD 14:55 10/10/18 ([email or web address])    Staphylococcus aureus (BCID) NOT DETECTED NOT DETECTED Final   Methicillin resistance NOT DETECTED NOT DETECTED Final   Streptococcus species NOT DETECTED NOT DETECTED Final   Streptococcus agalactiae NOT DETECTED NOT DETECTED Final   Streptococcus pneumoniae NOT DETECTED NOT DETECTED Final   Streptococcus pyogenes NOT DETECTED NOT DETECTED Final   Acinetobacter baumannii NOT DETECTED NOT DETECTED Final   Enterobacteriaceae species NOT DETECTED NOT DETECTED Final   Enterobacter cloacae complex NOT DETECTED NOT DETECTED Final   Escherichia coli NOT DETECTED NOT DETECTED Final   Klebsiella oxytoca NOT DETECTED NOT DETECTED Final   Klebsiella pneumoniae NOT DETECTED NOT DETECTED Final   Proteus species NOT DETECTED NOT DETECTED Final   Serratia marcescens NOT DETECTED NOT DETECTED Final   Haemophilus influenzae NOT DETECTED NOT DETECTED Final   Neisseria meningitidis NOT DETECTED NOT DETECTED Final   Pseudomonas aeruginosa NOT DETECTED NOT DETECTED Final   Candida albicans NOT DETECTED NOT DETECTED Final   Candida glabrata NOT DETECTED NOT DETECTED Final   Candida krusei NOT DETECTED NOT DETECTED Final   Candida parapsilosis NOT DETECTED NOT DETECTED Final   Candida tropicalis NOT DETECTED NOT DETECTED Final    Comment: Performed at [facility], [address], [place], [place] [address]      Radiology Studies: Vas [place] Upper Extremity Venous Duplex  Result Date: 10/10/2018 UPPER VENOUS STUDY  Indications: Swelling Performing Technologist: Oliver Hum RVT  Examination Guidelines: A complete evaluation includes B-mode imaging, spectral Doppler, color Doppler, and power Doppler as needed of all accessible portions of each vessel. Bilateral testing is considered an integral part of a complete examination. Limited examinations for reoccurring indications may be performed as noted.  Right Findings:  +----------+------------+----------+---------+-----------+-------+ RIGHT     CompressiblePropertiesPhasicitySpontaneousSummary +----------+------------+----------+---------+-----------+-------+ IJV           Full                 Yes       Yes            +----------+------------+----------+---------+-----------+-------+ Subclavian    Full                 Yes       Yes            +----------+------------+----------+---------+-----------+-------+ Axillary      Full                 Yes       Yes            +----------+------------+----------+---------+-----------+-------+ Brachial      Full                 Yes       Yes            +----------+------------+----------+---------+-----------+-------+  Radial        Full                                          +----------+------------+----------+---------+-----------+-------+ Ulnar         Full                                          +----------+------------+----------+---------+-----------+-------+ Cephalic      Full                                          +----------+------------+----------+---------+-----------+-------+ Basilic       None                                   Acute  +----------+------------+----------+---------+-----------+-------+  Left Findings: +----------+------------+----------+---------+-----------+-------+ LEFT      CompressiblePropertiesPhasicitySpontaneousSummary +----------+------------+----------+---------+-----------+-------+ Subclavian    Full                 Yes       Yes            +----------+------------+----------+---------+-----------+-------+  Summary:  Right: No evidence of deep vein thrombosis in the upper extremity. Findings consistent with acute superficial vein thrombosis involving the right basilic vein.  Left: No evidence of thrombosis in the subclavian.  *See table(s) above for measurements and observations.  Diagnosing physician: Deitra Mayo MD  Electronically signed by Deitra Mayo MD on 10/10/2018 at 10:42:53 AM.    Final      Medications:  Scheduled: . allopurinol  300 mg Oral q morning - 19J  . folic acid  1 mg Intravenous Daily  . magic mouthwash w/lidocaine  10 mL Oral QID  . mouth rinse  15 mL Mouth Rinse BID  . midodrine  10 mg Oral TID WC  . mupirocin ointment   Topical BID  . oxymetazoline  2 spray Each Nare BID  . phenytoin (DILANTIN) IV  100 mg Intravenous Q8H  . senna  1 tablet Oral BID  . thiamine injection  100 mg Intravenous Daily   Continuous: . 0.9 % NaCl with KCl 20 mEq / L 50 mL/hr at 10/10/18 1726  .  ceFAZolin (ANCEF) IV Stopped (10/11/18 0932)  . heparin 1,550 Units/hr (10/11/18 0300)   IZT:[id number] **OR** acetaminophen, bisacodyl, diphenhydrAMINE, HYDROcodone-acetaminophen    Assessment/Plan:  Fever most likely secondary to cellulitis versus thrombophlebitis of the right upper extremity Patient had an IV access in the right upper extremity which infiltrated.  Doppler shows acute superficial thrombophlebitis.  Since patient was febrile and had leukocytosis he was started on Ancef.  MRSA PCR was negative.  Lactic acid levels were normal.  One set of blood culture positive for gram-positive cocci which is preliminarily identified as coag negative staph.  It could be a contaminant but it could also be related to this superficial thrombophlebitis.  Will wait on final identification and then will need to discuss with ID.  Continue Ancef for now.  Hypotension, etiology unclear Patient has a history of chronic hypertension but he came in with pressures in the 70s and 80s.  Etiology was not immediately clear at admission.  No infection was present at the time of admission.  Cortisol level was normal.  Lactic acid level was normal.  Echocardiogram was incomplete study but showed mildly diminished systolic function.  However over the last 24 hours his blood pressures have improved.  Could be because  his infection which he developed later on in the hospital stay is being treated.  Could also be due to midodrine which will be continued for now.    Chronic systolic CHF Diminished EF noted on echocardiogram.  No evidence for acute exacerbation.  Due to his other comorbidities and poor baseline functional status he is not a candidate for further cardiac work-up or treatment.  Acute pulmonary embolism and lower extremity DVT Patient was found to have acute DVT in the lower extremity.  CT scan of the chest was done which revealed pulmonary embolism without any evidence for heart strain.  Patient being continued on IV heparin.  It appears that his oral intake has improved.  If he is consistently is able to take oral medications then he could be transitioned to Xarelto or Eliquis tomorrow.  Epistaxis has resolved.   Acute epistaxis Possibly related to heparin.  Was placed on Afrin nasal spray.  Resolved.  Hemoglobin is stable.  Acute renal failure with hypernatremia and hypokalemia and hypomagnesemia This was most likely due to poor oral intake over the past many days prior to admission.  Renal function is now normal.  Sodium and potassium levels have improved.  Magnesium is normal.     Mild rhabdomyolysis CK level was noted to be elevated.  Improved with hydration.  Levels have improved.  QT prolongation Avoid QT prolonging medications.  Monitor on telemetry.  Mildly abnormal LFTs Most likely in the setting of rhabdomyolysis.  Hepatitis panel is unremarkable.  Stable.  Patient with known history of cholelithiasis.  However his right upper quadrant is benign on examination.  Do not anticipate any further testing at this time.  History of seizure disorder EEG was done which revealed epileptiform activity.  Unclear if this was clinically significant.  However patient's Dilantin level was subtherapeutic at admission.  After discussions with neurology a loading dose of Dilantin was given.  No repeat  EEG per neurology.  Dilantin level was therapeutic on 1/12 when adjusted for low albumin.  Continue current dose of phenytoin.  Change to oral when he is able to take oral medications consistently.  Macrocytic anemia Drop in hemoglobin is likely dilutional.  No evidence for overt bleeding.  Anemia panel reviewed.  Ferritin 1686.  Folic acid level of 26.  Vitamin B12 level 799.  IMA globin is low but stable.  Acute metabolic encephalopathy Patient with known history of mental retardation.  See above under seizure disorder as well.  Mental status appears to be improving now.  Hypoalbuminemia/severe protein calorie malnutrition Oral intake has been poor.  Now improving.  History of mental retardation Patient has poor quality of life at baseline.  Apparently has been declining over the past several months.  Has had falls.  He is now wheelchair-bound.  Palliative medicine is following.  Discussions held with the patient's legal guardian.  He is now DNR.   For many days patient did not show any improvement but in the last 24 hours he appears to have perked up more.  His blood pressures have improved.  Palliative medicine continues to follow.   DVT Prophylaxis: On IV heparin Code Status: DNR Family Communication: No family or friends at bedside. Disposition Plan: Management as  outlined above.  Prognosis remains guarded.  However patient has stabilized enough for him to be transferred to the medical floor.      LOS: 6 days   [facility] Pager [phone] 10/11/2018, 8:09 AM  If 7PM-7AM, please contact night-coverage at [email or web address], password [facility]

## 2018-10-11 NOTE — Progress Notes (Signed)
[place] for heparin Indication: DVT & PE  Allergies  Allergen Reactions  . Amoxicillin Rash    Unsure wasn't listed on [facility] 01/29/17.  Severe rash Unsure wasn't listed on [facility] 01/29/17.  Severe rash  . Nsaids Other (See Comments)    Avoid NSAIDS--  Strong family hx liver cancer Avoid NSAIDS--  Strong family hx liver cancer   Patient Measurements: Height: 5\' 2"  (157.5 cm) Weight: 114 lb 10.2 oz (52 kg) IBW/kg (Calculated) : 54.6 Heparin Dosing Weight: 52kg  Vital Signs: Temp: 98.4 F (36.9 C) (01/14 0332) Temp Source: Axillary (01/14 0332) BP: 93/65 (01/14 0600) Pulse Rate: 80 (01/14 0600)  Labs: Recent Labs    10/09/18 0310  10/10/18 0308 10/10/18 1252 10/11/18 0236  HGB 7.8*  --  9.0*  --  8.3*  HCT 26.1*  --  29.3*  --  27.4*  PLT 194  --  238  --  280  HEPARINUNFRC 0.56   < > 0.31 0.38 0.44  CREATININE 0.92  --  0.90  --  0.90   < > = values in this interval not displayed.   Estimated Creatinine Clearance: 61 mL/min (by C-G formula based on SCr of 0.9 mg/dL).   Assessment:  63 year old male with a past medical history of mental retardation who lives in a group home who is wheelchair-bound at baseline.  Patient was brought into the hospital due to decreased oral intake for the past 3 days.   Pharmacy has been consulted to dose heparin drip for DVT and PE.  Baseline INR borderline elevated, aPTT WNL  Prior anticoagulation: none  Significant events: 1/12 RN reports nose bleeding.  Heparin held for 2 hrs then resumed. 1/13 Upper extremity negative for DVT, +acute superficial vein thrombosis in the right basilic vein.  Today, 10/11/2018 :  Heparin level 0.44, therapeutic on IV heparin at 1550 units/hr  CBC - Hgb decreased to 8.3 (near previous values).  Plts remain WNL  No reported bleeding or line issues per RN.  No further nose bleeds reported.  SCr 0.9, stable with CrCl ~ 61 ml/min  Goal of Therapy: Heparin level  0.3-0.7 units/ml Monitor platelets by anticoagulation protocol: Yes  Plan:  Continue IV heparin at current rate of 1550 units/hr  Daily HL/CBC  Monitor for bleeding  Follow up plans for long-term anticoagulation; oral if/when consistent PO intake.  Gretta Arab PharmD, BCPS Pager [phone] 10/11/2018 7:09 AM

## 2018-10-11 NOTE — Progress Notes (Signed)
Transferred to 1532 via bed .Report given to RN.

## 2018-10-11 NOTE — Progress Notes (Signed)
Nutrition Follow-up  DOCUMENTATION CODES:   Not applicable  INTERVENTION:  - Continue Magic Cup TID per diet order protocol.  - Continue to encourage PO intakes. - Tech/RN to provide feeding assistance as needed.  - Will order daily multivitamin with minerals.    NUTRITION DIAGNOSIS:   Inadequate oral intake related to lethargy/confusion, acute illness as evidenced by meal completion < 50%. -ongoing, revised.  GOAL:   Patient will meet greater than or equal to 90% of their needs -likely unmet  MONITOR:   PO intake, Supplement acceptance, Weight trends, Labs, Skin  ASSESSMENT:   63 year old male with a past medical history of mental retardation who lives in a group home who is wheelchair-bound at baseline.  His legal guardian, who is his sister, lives in [place], [place].  Patient was brought into the hospital due to decreased oral intake for the past 3 days.  Patient has dementia at baseline and has been progressively getting worse over the past many months.  No weight since admission on 1/9. Per chart review, patient has been eating mainly ice cream. Patient awake but unable to answer RD questions. No family/visitors present. Spoke with RN who reports patient drank 100 mL water and ate ice cream and applesauce this AM. She reports plan to provide 10 AM meds in ice cream.   Palliative following and last saw patient yesterday. SLP saw patient for follow-up yesterday and plan to continue Dysphagia 1, thin liquids.    Medications reviewed; 1 mg IV folic acid/day, 1 tablet senokot BID, 100 mg thiamine/day.  Labs reviewed; Cl: 112 mmol/L, Ca: 6.9 mg/dL. IVF; NS-20 mEq IV KCl @ 50 mL/hr.      Diet Order:   Diet Order            DIET - DYS 1 Room service appropriate? Yes; Fluid consistency: Thin  Diet effective now              EDUCATION NEEDS:   No education needs have been identified at this time  Skin:  Skin Assessment: Skin Integrity Issues: Skin Integrity  Issues:: DTI, Unstageable DTI: L IT and toe Unstageable: full thickness to R knee, L foot, L tibia, and L leg  Last BM:  1/13  Height:   Ht Readings from Last 1 Encounters:  10/06/18 5\' 2"  (1.575 m)    Weight:   Wt Readings from Last 1 Encounters:  10/06/18 52 kg    Ideal Body Weight:  53.63 kg  BMI:  Body mass index is 20.97 kg/m.  Estimated Nutritional Needs:   Kcal:  7494-4967 kcal  Protein:  80-95 grams  Fluid:  >/= 1.7 L/day     Jarome Matin, MS, RD, LDN, [facility] Inpatient Clinical Dietitian Pager # [phone] After hours/weekend pager # [phone]

## 2018-10-12 ENCOUNTER — Ambulatory Visit: Payer: Medicare Other | Admitting: Adult Health

## 2018-10-12 ENCOUNTER — Encounter

## 2018-10-12 DIAGNOSIS — G9341 Metabolic encephalopathy: Secondary | ICD-10-CM

## 2018-10-12 LAB — BASIC METABOLIC PANEL
Anion gap: 9 (ref 5–15)
BUN: 8 mg/dL (ref 8–23)
CO2: 23 mmol/L (ref 22–32)
Calcium: 7.1 mg/dL — ABNORMAL LOW (ref 8.9–10.3)
Chloride: 104 mmol/L (ref 98–111)
Creatinine, Ser: 0.9 mg/dL (ref 0.61–1.24)
GFR calc Af Amer: >60 mL/min (ref >60)
GFR calc non Af Amer: >60 mL/min (ref >60)
Glucose, Bld: 107 mg/dL — ABNORMAL HIGH (ref 70–99)
POTASSIUM: 3.9 mmol/L (ref 3.5–5.1)
Sodium: 136 mmol/L (ref 135–145)

## 2018-10-12 LAB — CBC
HCT: 25.4 % — ABNORMAL LOW (ref 39.0–52.0)
Hemoglobin: 7.9 g/dL — ABNORMAL LOW (ref 13.0–17.0)
MCH: 32.8 pg (ref 26.0–34.0)
MCHC: 31.1 g/dL (ref 30.0–36.0)
MCV: 105.4 fL — ABNORMAL HIGH (ref 80.0–100.0)
Platelets: 280 10*3/uL (ref 150–400)
RBC: 2.41 MIL/uL — ABNORMAL LOW (ref 4.22–5.81)
RDW: 14.8 % (ref 11.5–15.5)
WBC: 10.4 10*3/uL (ref 4.0–10.5)
nRBC: 0 % (ref 0.0–0.2)

## 2018-10-12 LAB — HEMOGLOBIN AND HEMATOCRIT, BLOOD
HCT: 25.4 % — ABNORMAL LOW (ref 39.0–52.0)
HEMATOCRIT: 23.4 % — AB (ref 39.0–52.0)
Hemoglobin: 7.1 g/dL — ABNORMAL LOW (ref 13.0–17.0)
Hemoglobin: 7.7 g/dL — ABNORMAL LOW (ref 13.0–17.0)

## 2018-10-12 LAB — CULTURE, BLOOD (ROUTINE X 2)

## 2018-10-12 LAB — HEPARIN LEVEL (UNFRACTIONATED): Heparin Unfractionated: 0.47 IU/mL (ref 0.30–0.70)

## 2018-10-12 MED ORDER — POVIDONE-IODINE 10 % EX SOLN
CUTANEOUS | Status: DC | PRN
  Filled 2018-10-12: qty 118

## 2018-10-12 MED ORDER — HYDROCODONE-ACETAMINOPHEN 5-325 MG PO TABS
1.0000 | ORAL_TABLET | ORAL | Status: DC | PRN
  Administered 2018-10-13 (×2): 1 via ORAL
  Filled 2018-10-12 (×2): qty 1

## 2018-10-12 MED ORDER — HEPARIN (PORCINE) 25000 UT/250ML-% IV SOLN
1550.0000 [IU]/h | INTRAVENOUS | Status: DC
  Administered 2018-10-12 – 2018-10-13 (×2): 1550 [IU]/h via INTRAVENOUS
  Filled 2018-10-12 (×2): qty 250

## 2018-10-12 MED ORDER — SODIUM CHLORIDE 0.9 % IV BOLUS
500.0000 mL | Freq: Once | INTRAVENOUS | Status: AC
  Administered 2018-10-12: 500 mL via INTRAVENOUS

## 2018-10-12 NOTE — Progress Notes (Signed)
[place] for heparin Indication: DVT & PE  Allergies  Allergen Reactions  . Amoxicillin Rash    Unsure wasn't listed on [facility] 01/29/17.  Severe rash Unsure wasn't listed on [facility] 01/29/17.  Severe rash  . Nsaids Other (See Comments)    Avoid NSAIDS--  Strong family hx liver cancer Avoid NSAIDS--  Strong family hx liver cancer   Patient Measurements: Height: 5\' 2"  (157.5 cm) Weight: 114 lb 10.2 oz (52 kg) IBW/kg (Calculated) : 54.6 Heparin Dosing Weight: 52kg  Vital Signs: Temp: 98.9 F (37.2 C) (01/15 0539) Temp Source: Oral (01/15 0539) BP: 130/80 (01/15 0539) Pulse Rate: 76 (01/15 0539)  Labs: Recent Labs    10/10/18 0308 10/10/18 1252 10/11/18 0236 10/12/18 0445  HGB 9.0*  --  8.3* 7.9*  HCT 29.3*  --  27.4* 25.4*  PLT 238  --  280 280  HEPARINUNFRC 0.31 0.38 0.44 0.47  CREATININE 0.90  --  0.90 0.90   Estimated Creatinine Clearance: 61 mL/min (by C-G formula based on SCr of 0.9 mg/dL).   Assessment:  63 year old male with a past medical history of mental retardation who lives in a group home who is wheelchair-bound at baseline.  Patient was brought into the hospital due to decreased oral intake for the past 3 days.   Pharmacy has been consulted to dose heparin drip for DVT and PE.  Baseline INR borderline elevated, aPTT WNL  Prior anticoagulation: none  Significant events: 1/12 RN reports nose bleeding.  Heparin held for 2 hrs then resumed. 1/13 Upper extremity negative for DVT, +acute superficial vein thrombosis in the right basilic vein.  Today, 10/12/2018 :  Heparin level 0.47, therapeutic on IV heparin at 1550 units/hr  CBC - Hgb decreased to 7.9 (near previous values).  Plts remain WNL  No reported bleeding or line issues per RN.  No further nose bleeds reported.  SCr 0.9, stable with CrCl ~ 61 ml/min  Goal of Therapy: Heparin level 0.3-0.7 units/ml Monitor platelets by anticoagulation protocol:  Yes  Plan:  Continue IV heparin at current rate of 1550 units/hr  Daily HL/CBC  Monitor for bleeding  Follow up plans for long-term anticoagulation; oral if/when consistent PO intake.  Dolly Rias RPh 10/12/2018, 7:21 AM Pager [phone]

## 2018-10-12 NOTE — Progress Notes (Signed)
[place] for heparin Indication: DVT & PE  Allergies  Allergen Reactions  . Amoxicillin Rash    Unsure wasn't listed on [facility] 01/29/17.  Severe rash Unsure wasn't listed on [facility] 01/29/17.  Severe rash  . Nsaids Other (See Comments)    Avoid NSAIDS--  Strong family hx liver cancer Avoid NSAIDS--  Strong family hx liver cancer   Patient Measurements: Height: 5\' 2"  (157.5 cm) Weight: 114 lb 10.2 oz (52 kg) IBW/kg (Calculated) : 54.6 Heparin Dosing Weight: 52kg  Vital Signs: Temp: 99.9 F (37.7 C) (01/15 1248) Temp Source: Oral (01/15 1248) BP: 95/55 (01/15 1248) Pulse Rate: 87 (01/15 1248)  Labs: Recent Labs    10/10/18 0308 10/10/18 1252 10/11/18 0236 10/12/18 0445 10/12/18 1538 10/12/18 1836  HGB 9.0*  --  8.3* 7.9* 7.1* 7.7*  HCT 29.3*  --  27.4* 25.4* 23.4* 25.4*  PLT 238  --  280 280  --   --   HEPARINUNFRC 0.31 0.38 0.44 0.47  --   --   CREATININE 0.90  --  0.90 0.90  --   --    Estimated Creatinine Clearance: 61 mL/min (by C-G formula based on SCr of 0.9 mg/dL).   Assessment:  63 year old male with a past medical history of mental retardation who lives in a group home who is wheelchair-bound at baseline.  Patient was brought into the hospital due to decreased oral intake for the past 3 days.   Pharmacy has been consulted to dose heparin drip for DVT and PE.  Baseline INR borderline elevated, aPTT WNL  Prior anticoagulation: none  Significant events: 1/12 RN reports nose bleeding.  Heparin held for 2 hrs then resumed. 1/13 Upper extremity negative for DVT, +acute superficial vein thrombosis in the right basilic vein.  Today, 10/12/2018 :  Heparin level 0.47, therapeutic on IV heparin at 1550 units/hr  CBC - Hgb decreased to 7.9 (near previous values).  Plts remain WNL  No reported bleeding or line issues per RN.  No further nose bleeds reported.  SCr 0.9, stable with CrCl ~ 61 ml/min  8:03 PM  - Heparin drip paused  for a few hours due to drop in Hgb - Upon recheck, Hgb stable - No issues reported   Goal of Therapy: Heparin level 0.3-0.7 units/ml Monitor platelets by anticoagulation protocol: Yes  Plan:  Instructed RN to resume infusion at previous rate  Daily HL/CBC  Monitor for bleeding  Follow up plans for long-term anticoagulation; oral if/when consistent PO intake.  Ulice Dash, PharmD Clinical Pharmacist Pager # [phone]  10/12/2018, 8:03 PM

## 2018-10-12 NOTE — Progress Notes (Addendum)
**Note De-Identified vi Obfusction** PROGRESS NOTE    Crmelo Reidel  BJS:[id number] DOB: 03-Aug-1956 DOA: 10/21/2018 PCP: Wend Low, MD   Brief Nrrtive:  48 yer old mle with  pst medicl history of mentl retrdtion who lives in  group home who is wheelchir-bound t bseline.  Her legl gurdin who is pprently his sister who lives in [place].  Ptient ws brought into the hospitl due to decresed orl intke for the pst 3 dys.  Apprently ptient hs dementi t bseline nd hs been progressively getting worse over the pst mny months.  Assessment & Pln:   Active Problems:   Down's syndrome   Acute renl filure (ARF) (HCC)   Dehydrtion   QT prolongtion   Hyperntremi   Pressure injury of skin   DNR (do not resuscitte)   Dementi ssocited with other underlying disese without behviorl disturbnce ([place])   Pllitive cre encounter   Fever  Cellulitis versus Thrombophlebitis of the right upper extremity Ptient hd n IV ccess in the right upper extremity which infiltrted.  Doppler shows cute superficil thrombophlebitis.   Pt on ncef from 1/12 - present  MRSA PCR negtive 1/2 blood cx with cogulse negtive stph - suspect this is contminnt, but will pln to repet cx once course of bx is complete for cellulitis (discussed with ID)  Hypotension, etiology uncler BP fluctuting, pprently pt with hx of chronic hypotension Presented with BP's in 70's nd 80's Improved tody Etiology ws not immeditely cler t dmission.  No infection ws present t the time of dmission.   Cortisol level ws norml (13.6), mking drenl insufficiency unlikely.  Lctic cid level ws norml.   Echo with EF 40-45% with hypokinesis of nterior wll, nteroseptum nd nterolterl wlls - difficult to tell if relted to LBBB or regionl wll motion bnormlity (per report, see below) BP's hve improved with midodrine nd tretment of his infection bove   Chronic systolic  CHF Diminished EF noted on echocrdiogrm.  No evidence for cute excerbtion.   Report s noted below Due to his other comorbidities nd poor bseline functionl sttus he is not  cndidte for further crdic work-up or tretment.  Acute pulmonry embolism nd lower extremity DVT Ptient ws found to hve cute DVT in the lower extremity.   CT PE protocol with right lower lobe PE without evidence of right hert strin Pt on heprin - holding now, with downtrending H/H - follow repet H/H to see if this is spurious, will resume  It ppers tht his orl intke hs improved.  If he is consistently is ble to tke orl medictions then he could be trnsitioned to Xrelto or Eliquis tomorrow.  Epistxis hs resolved.   Mcrocytic nemi hemoglobin hs been downtrending since 1/13 (but it ws ~7.8 on 1/12).  Drop ws thought to be dilutionl by previous MD.  Lbs suggestive of AOCD with iron deficiency.  Norml folte/b12.  No signs of bleeding nd epistxis seems resolved.  Will hold heprin until repet H/H/type nd screen.  Will resume if ble.  Delirium: pt gitted lst night nd this morning.  He ws somnolent when I sw him (on discussion with friends t bedside, te brekfst well this AM).  Pt lso woke for pllitive cre. Delirium precutions Will decrese norco dose s this seemed to cuse sleepiness  Acute epistxis Possibly relted to heprin.  Ws plced on Afrin nsl spry.  Resolved.  Hemoglobin is stble.  Acute renl filure with hyperntremi nd hypoklemi nd hypomgnesemi This ws most likely due to poor **Note De-Identified vi Obfusction** orl intke over the pst mny dys prior to dmission.  Renl function is now norml.  Sodium nd potssium levels hve improved.  Mgnesium is norml.     Mild rhbdomyolysis CK level ws noted to be elevted.  Improved with hydrtion.  Levels hve improved.  QT prolongtion Avoid QT prolonging medictions.  Monitor on telemetry.  Mildly bnorml  LFTs Most likely in the setting of rhbdomyolysis.  Heptitis pnel is unremrkble.  Stble.  Ptient with known history of cholelithisis.  However his right upper qudrnt is benign on exmintion.  Do not nticipte ny further testing t this time. Repet in the AM  History of seizure disorder EEG ws done which reveled epileptiform ctivity.  Uncler if this ws cliniclly significnt.  However ptient's Dilntin level ws subtherpeutic t dmission.  After discussions with neurology  loding dose of Dilntin ws given.  No repet EEG per neurology.  Dilntin level ws therpeutic on 1/12 when djusted for low lbumin.  Continue current dose of phenytoin.  Chnge to orl when he is ble to tke orl medictions consistently. My need to consider discussing with neurology gin if not improving (neurology note from 6/30)  Acute metbolic encephlopthy Ptient with known history of mentl retrdtion.  See bove under seizure disorder s well.  Mentl sttus ppers to be improving now.  Hypolbuminemi/severe protein clorie mlnutrition Orl intke hs been poor.  Now improving.  History of mentl retrdtion Ptient hs poor qulity of life t bseline.  Apprently hs been declining over the pst severl months.  Hs hd flls.  He is now wheelchir-bound.  Pllitive medicine is following.  Discussions held with the ptient's legl gurdin.  He is now DNR.  For mny dys ptient did not show ny improvement but in the lst 24 hours he ppers to hve perked up more.  His blood pressures hve improved.  Pllitive medicine continues to follow.   Addendum: h/h pproves stble, will resume heprin  DVT prophylxis: heprin, on hold, repet H/H nd resume if ble Code Sttus: DNR Fmily Communiction: fmily friend Tmmy t bedside Disposition Pln: pending improvement   Consultnts:   neurology  Procedures:  Echo Study Conclusions  - Left ventricle: Systolic function  ws mildly to modertely   reduced. The estimted ejection frction ws in the rnge of 40%   to 45%. Cnnot exclude hypokinesis of the nteroseptl, nterior,   nd nterolterl myocrdium. - Ventriculr septum: Septl motion showed dyssynergy. These   chnges re consistent with  left bundle brnch block. - Impressions: Incomplete sutdy s ptient terminted study prior   to exm completion. On vilble imges EF ppers to be in the   40-45% rnge with hypokinesis of the nterior wll, nteroseptum   nd nterolterl wlls. As there re no 2-chmber imges hrd to   tell if this is ll due to LBBB or if there is lso  regionl   wll motion bnormlity due to previous infrct.  Impressions:  - Incomplete sutdy s ptient terminted study prior to exm   completion. On vilble imges EF ppers to be in the 40-45%   rnge with hypokinesis of the nterior wll, nteroseptum nd   nterolterl wlls. As there re no 2-chmber imges hrd to   tell if this is ll due to LBBB or if there is lso  regionl   wll motion bnormlity due to previous infrct.   Antimicrobils:  Anti-infectives (From dmission, onwrd)   Strt     Dose/Rte Route Frequency Ordered Stop  10/10/18 2200  ceFAZolin (ANCEF) IVPB 2g/100 mL premix     2 g 200 mL/hr over 30 Minutes Intravenous Every 8 hours 10/10/18 1510     10/09/18 1400  ceFAZolin (ANCEF) IVPB 1 g/50 mL premix  Status:  Discontinued     1 g 100 mL/hr over 30 Minutes Intravenous Every 8 hours 10/09/18 1256 10/10/18 1510         Subjective: Pt sleepy. Per Tammy, was awake this morning to eat breakfast. Had episode of agitation last night and this AM after breakfast. Now somnolent after receiving pain meds.  Objective: Vitals:   10/11/18 2100 10/11/18 2329 10/12/18 0539 10/12/18 1248  BP: 122/70  130/80 (!) 95/55  Pulse: 73 74 76 87  Resp: 18  18   Temp: 98.9 F (37.2 C)  98.9 F (37.2 C) 99.9 F (37.7 C)  TempSrc:  Oral  Oral Oral  SpO2: 98% 98% 99% 96%  Weight:      Height:        Intake/Output Summary (Last 24 hours) at 10/12/2018 1738 Last data filed at 10/12/2018 1400 Gross per 24 hour  Intake 2282.12 ml  Output 1800 ml  Net 482.12 ml   Filed Weights   10/06/18 0700  Weight: 52 kg    Examination:  General exam: somnolent, opens eyes to irritative stimulus to foot Respiratory system: Clear to auscultation. Respiratory effort normal. Cardiovascular system: S1 & S2 heard, RRR. Gastrointestinal system: Abdomen is nondistended, soft and mild discomfort on abdominal exam Central nervous system: somnolent (per friend, after receiving pain meds), opens eyes to irritative stimuli to foot. Extremities: no lee.  RUE erythema/swelling seems improved. Skin: No rashes, lesions or ulcers    Data Reviewed: I have personally reviewed following labs and imaging studies  CBC: Recent Labs  Lab 10/08/18 0011 10/09/18 0310 10/10/18 0308 10/11/18 0236 10/12/18 0445 10/12/18 1538  WBC 7.0 13.5* 19.7* 14.8* 10.4  --   HGB 8.4* 7.8* 9.0* 8.3* 7.9* 7.1*  HCT 27.8* 26.1* 29.3* 27.4* 25.4* 23.4*  MCV 108.2* 108.3* 107.7* 106.6* 105.4*  --   PLT 190 194 238 280 280  --    Basic Metabolic Panel: Recent Labs  Lab 10/14/2018 1903  10/06/18 0248  10/08/18 0011 10/09/18 0310 10/10/18 0308 10/11/18 0236 10/12/18 0445  NA  --    < > 150*   < > 147* 142 141 138 136  K  --    < > 3.7   < > 3.1* 3.2* 3.9 3.8 3.9  CL  --    < > 121*   < > 120* 116* 113* 112* 104  CO2  --    < > 20*   < > 22 21* 21* 20* 23  GLUCOSE  --    < > 142*   < > 155* 118* 85 96 107*  BUN  --    < > 49*   < > 14 11 11 9 8   CREATININE  --    < > 1.24   < > 1.02 0.92 0.90 0.90 0.90  CALCIUM  --    < > 7.5*   < > 6.9* 6.6* 6.9* 6.9* 7.1*  MG 2.5*  --  2.1  --   --  1.5* 2.0  --   --   PHOS 5.0*  --  3.4  --   --   --   --   --   --    < > = values in this interval  not displayed.   GFR: Estimated Creatinine Clearance: 61 mL/min  (by C-G formula based on SCr of 0.9 mg/dL). Liver Function Tests: Recent Labs  Lab 10/06/18 0248 10/07/18 0253 10/08/18 0011 10/09/18 0310  AST 57* 64* 79* 50*  ALT 40 40 46* 37  ALKPHOS 69 67 103 108  BILITOT 0.5 0.5 0.4 0.3  PROT 5.3* 4.7* 4.6* 4.1*  ALBUMIN 1.9* 1.6* 1.6* 1.3*   No results for input(s): LIPASE, AMYLASE in the last 168 hours. No results for input(s): AMMONIA in the last 168 hours. Coagulation Profile: Recent Labs  Lab 10/06/18 1518  INR 1.44   Cardiac Enzymes: Recent Labs  Lab 10/17/2018 1903 10/11/2018 2106 10/06/18 0248 10/08/18 0011  CKTOTAL 898*  --  728* 528*  TROPONINI  --  <0.03  --   --    BNP (last 3 results) No results for input(s): PROBNP in the last 8760 hours. HbA1C: No results for input(s): HGBA1C in the last 72 hours. CBG: No results for input(s): GLUCAP in the last 168 hours. Lipid Profile: No results for input(s): CHOL, HDL, LDLCALC, TRIG, CHOLHDL, LDLDIRECT in the last 72 hours. Thyroid Function Tests: No results for input(s): TSH, T4TOTAL, FREET4, T3FREE, THYROIDAB in the last 72 hours. Anemia Panel: No results for input(s): VITAMINB12, FOLATE, FERRITIN, TIBC, IRON, RETICCTPCT in the last 72 hours. Sepsis Labs: Recent Labs  Lab 10/14/2018 1941 10/06/18 0833 10/09/18 0854  LATICACIDVEN 0.93 0.8 1.4    Recent Results (from the past 240 hour(s))  Urine culture     Status: None   Collection Time: 10/08/2018  3:32 PM  Result Value Ref Range Status   Specimen Description   Final    URINE, CATHETERIZED Performed at [facility] [address], [place], [place] [address]    Special Requests   Final    NONE Performed at [facility], [place] [address] [place], [place] [address]    Culture   Final    NO GROWTH Performed at [facility], [place] [address], [place], [place] [address]    Report Status 10/07/2018 FINAL  Final  Blood Culture (routine x 2)     Status: None   Collection Time:  10/25/2018  4:50 PM  Result Value Ref Range Status   Specimen Description   Final    BLOOD RIGHT ARM Performed at [facility] [address] [place], [place] [address]    Special Requests   Final    BOTTLES DRAWN AEROBIC AND ANAEROBIC Blood Culture adequate volume   Culture   Final    NO GROWTH 5 DAYS Performed at [facility], [place] [address] [place], [place] [address]    Report Status 10/10/2018 FINAL  Final  Blood Culture (routine x 2)     Status: None   Collection Time: 10/04/2018  5:06 PM  Result Value Ref Range Status   Specimen Description BLOOD BLOOD LEFT FOREARM  Final   Special Requests   Final    BOTTLES DRAWN AEROBIC AND ANAEROBIC Blood Culture adequate volume Performed at [place] [address] [place], [place] [address]    Culture   Final    NO GROWTH 5 DAYS Performed at [facility], [place] [address], [place], [place] [address]    Report Status 10/10/2018 FINAL  Final  MRSA PCR Screening     Status: None   Collection Time: 10/14/2018  8:57 PM  Result Value Ref Range Status **Note De-Identified vi Obfusction** MRS by PCR NEGTIVE NEGTIVE Finl    Comment:        The GeneXpert MRS ssy (FD pproved for NSL specimens only), is one component of  comprehensive MRS coloniztion surveillnce progrm. It is not intended to dignose MRS infection nor to guide or monitor tretment for MRS infections. Performed t [facility], [place] [address] [place], [place] [address]   Culture, blood (Routine X 2) w Reflex to ID Pnel     Sttus: None (Preliminry result)   Collection Time: 10/09/18  8:54 M  Result Vlue Ref Rnge Sttus   Specimen Description   Finl    BLOOD RIGHT HND Performed t [place] [address] [place], [place] [address]    Specil Requests   Finl    BOTTLES DRWN EROBIC ONLY Blood Culture dequte volume Performed t [place] [address],  [place], [place] [address]    Culture   Finl    NO GROWTH 3 DYS Performed t [facility], [place] [address] [place], [place] [address]    Report Sttus PENDING  Incomplete  Culture, blood (Routine X 2) w Reflex to ID Pnel     Sttus: [email or web address]   Collection Time: 10/09/18  8:54 M  Result Vlue Ref Rnge Sttus   Specimen Description BLOOD RIGHT RM  Finl   Specil Requests   Finl    BOTTLES DRWN EROBIC ONLY Blood Culture results my not be optiml due to n indequte volume of blood received in culture bottles Performed t [facility], [place] [address] [place], [place] [address]    Culture  Setup Time   Finl    GRM POSITIVE COCCI EROBIC BOTTLE ONLY Orgnism ID to follow CRITICL RESULT CLLED TO, RED BCK BY ND VERIFIED WITH: Sheld Jkes PhrmD 14:55 10/10/18 ([email or web address])    Culture ()  Finl    STPHYLOCOCCUS SPECIES (COGULSE NEGTIVE) THE SIGNIFICNCE OF ISOLTING THIS ORGNISM FROM  SINGLE SET OF BLOOD CULTURES WHEN MULTIPLE SETS RE DRWN IS UNCERTIN. PLESE NOTIFY THE MICROBIOLOGY DEPRTMENT WITHIN ONE WEEK IF SPECITION ND SENSITIVITIES RE REQUIRED. Performed t [facility], [place] [address], [place], [place] [address]    Report Sttus 10/12/2018 FINL  Finl  Blood Culture ID Pnel (Reflexed)     Sttus: [email or web address]   Collection Time: 10/09/18  8:54 M  Result Vlue Ref Rnge Sttus   Enterococcus species NOT DETECTED NOT DETECTED Finl   Listeri monocytogenes NOT DETECTED NOT DETECTED Finl   Stphylococcus species DETECTED () NOT DETECTED Finl    Comment: Methicillin (oxcillin) susceptible cogulse negtive stphylococcus. Possible blood culture contminnt (unless isolted from more thn one blood culture drw or clinicl cse suggests pthogenicity). No ntibiotic tretment is indicted for blood  culture contminnts. CRITICL RESULT CLLED TO, RED BCK BY ND VERIFIED WITH: Sheld Jkes PhrmD 14:55 10/10/18 ([email or web address])     Stphylococcus ureus (BCID) NOT DETECTED NOT DETECTED Finl   Methicillin resistnce NOT DETECTED NOT DETECTED Finl   Streptococcus species NOT DETECTED NOT DETECTED Finl   Streptococcus glctie NOT DETECTED NOT DETECTED Finl   Streptococcus pneumonie NOT DETECTED NOT DETECTED Finl   Streptococcus pyogenes NOT DETECTED NOT DETECTED Finl   cinetobcter bumnnii NOT DETECTED NOT DETECTED Finl   Enterobctericee species NOT DETECTED NOT DETECTED Finl   Enterobcter cloce complex NOT DETECTED NOT DETECTED Finl   Escherichi coli NOT DETECTED NOT DETECTED Finl   Klebsiell oxytoc NOT DETECTED NOT DETECTED Finl  Klebsiella pneumoniae NOT DETECTED NOT DETECTED Final   Proteus species NOT DETECTED NOT DETECTED Final   Serratia marcescens NOT DETECTED NOT DETECTED Final   Haemophilus influenzae NOT DETECTED NOT DETECTED Final   Neisseria meningitidis NOT DETECTED NOT DETECTED Final   Pseudomonas aeruginosa NOT DETECTED NOT DETECTED Final   Candida albicans NOT DETECTED NOT DETECTED Final   Candida glabrata NOT DETECTED NOT DETECTED Final   Candida krusei NOT DETECTED NOT DETECTED Final   Candida parapsilosis NOT DETECTED NOT DETECTED Final   Candida tropicalis NOT DETECTED NOT DETECTED Final    Comment: Performed at [facility], [place] [address] [place], [place] [address]         Radiology Studies: No results found.      Scheduled Meds: . allopurinol  300 mg Oral q morning - 59F  . folic acid  1 mg Intravenous Daily  . magic mouthwash w/lidocaine  10 mL Oral QID  . mouth rinse  15 mL Mouth Rinse BID  . midodrine  10 mg Oral TID WC  . multivitamin with minerals  1 tablet Oral Daily  . mupirocin ointment   Topical BID  . phenytoin (DILANTIN) IV  100 mg Intravenous Q8H  . senna  1 tablet Oral BID  . thiamine injection  100 mg Intravenous Daily   Continuous Infusions: . 0.9 % NaCl with KCl 20 mEq / L 50 mL/hr at 10/11/18 1133  .  ceFAZolin (ANCEF) IV  2 g (10/12/18 1451)  . heparin 1,550 Units/hr (10/12/18 0806)     LOS: 7 days    Time spent: over 23 min    Fayrene Helper, MD [facility] Pager see AMION for pager  If 7PM-7AM, please contact night-coverage [email or web address] Password [facility] 10/12/2018, 5:38 PM

## 2018-10-12 NOTE — Care Management Important Message (Signed)
Important Message  Patient Details  Name: Sean Murray MRN: [id number] Date of Birth: [birth year]   Medicare Important Message Given:  Yes    Kerin Salen 10/12/2018, 10:10 AMImportant Message  Patient Details  Name: Sean Murray MRN: [id number] Date of Birth: [birth year]   Medicare Important Message Given:  Yes    Kerin Salen 10/12/2018, 10:10 AM

## 2018-10-13 ENCOUNTER — Inpatient Hospital Stay (HOSPITAL_COMMUNITY): Payer: Medicare Other

## 2018-10-13 ENCOUNTER — Other Ambulatory Visit: Payer: Self-pay

## 2018-10-13 DIAGNOSIS — R509 Fever, unspecified: Secondary | ICD-10-CM

## 2018-10-13 LAB — OCCULT BLOOD X 1 CARD TO LAB, STOOL: Fecal Occult Bld: POSITIVE — AB

## 2018-10-13 LAB — COMPREHENSIVE METABOLIC PANEL
ALBUMIN: 1.4 g/dL — AB (ref 3.5–5.0)
ALT: 17 U/L (ref 0–44)
AST: 48 U/L — ABNORMAL HIGH (ref 15–41)
Alkaline Phosphatase: 145 U/L — ABNORMAL HIGH (ref 38–126)
Anion gap: 10 (ref 5–15)
BILIRUBIN TOTAL: 0.3 mg/dL (ref 0.3–1.2)
BUN: 11 mg/dL (ref 8–23)
CO2: 23 mmol/L (ref 22–32)
Calcium: 6.8 mg/dL — ABNORMAL LOW (ref 8.9–10.3)
Chloride: 103 mmol/L (ref 98–111)
Creatinine, Ser: 1.01 mg/dL (ref 0.61–1.24)
GFR calc Af Amer: >60 mL/min (ref >60)
GFR calc non Af Amer: >60 mL/min (ref >60)
Glucose, Bld: 109 mg/dL — ABNORMAL HIGH (ref 70–99)
Potassium: 4 mmol/L (ref 3.5–5.1)
Sodium: 136 mmol/L (ref 135–145)
Total Protein: 4.8 g/dL — ABNORMAL LOW (ref 6.5–8.1)

## 2018-10-13 LAB — URINALYSIS, ROUTINE W REFLEX MICROSCOPIC
Bilirubin Urine: NEGATIVE
Glucose, UA: NEGATIVE mg/dL
Hgb urine dipstick: NEGATIVE
Ketones, ur: NEGATIVE mg/dL
Leukocytes, UA: NEGATIVE
Nitrite: NEGATIVE
PROTEIN: NEGATIVE mg/dL
Specific Gravity, Urine: 1.016 (ref 1.005–1.030)
pH: 5 (ref 5.0–8.0)

## 2018-10-13 LAB — MRSA PCR SCREENING: MRSA by PCR: NEGATIVE

## 2018-10-13 LAB — CBC
HCT: 20.8 % — ABNORMAL LOW (ref 39.0–52.0)
Hemoglobin: 6.4 g/dL — CL (ref 13.0–17.0)
MCH: 33 pg (ref 26.0–34.0)
MCHC: 30.8 g/dL (ref 30.0–36.0)
MCV: 107.2 fL — ABNORMAL HIGH (ref 80.0–100.0)
NRBC: 0 % (ref 0.0–0.2)
Platelets: 269 10*3/uL (ref 150–400)
RBC: 1.94 MIL/uL — ABNORMAL LOW (ref 4.22–5.81)
RDW: 15.3 % (ref 11.5–15.5)
WBC: 12.8 10*3/uL — ABNORMAL HIGH (ref 4.0–10.5)

## 2018-10-13 LAB — MAGNESIUM: Magnesium: 1.8 mg/dL (ref 1.7–2.4)

## 2018-10-13 LAB — ABO/RH: ABO/RH(D): A POS

## 2018-10-13 LAB — PREPARE RBC (CROSSMATCH)

## 2018-10-13 MED ORDER — SODIUM CHLORIDE 0.9% IV SOLUTION
Freq: Once | INTRAVENOUS | Status: AC
  Administered 2018-10-13: 15:00:00 via INTRAVENOUS

## 2018-10-13 MED ORDER — FENTANYL CITRATE (PF) 100 MCG/2ML IJ SOLN
12.5000 ug | INTRAMUSCULAR | Status: DC | PRN
  Administered 2018-10-14 (×2): 12.5 ug via INTRAVENOUS
  Filled 2018-10-13 (×2): qty 2

## 2018-10-13 MED ORDER — LORAZEPAM 2 MG/ML IJ SOLN
0.2500 mg | Freq: Four times a day (QID) | INTRAMUSCULAR | Status: DC | PRN
  Administered 2018-10-13 – 2018-10-14 (×2): 0.25 mg via INTRAVENOUS
  Filled 2018-10-13 (×2): qty 1

## 2018-10-13 MED ORDER — SODIUM CHLORIDE 0.9 % IV SOLN
2.0000 g | Freq: Once | INTRAVENOUS | Status: AC
  Administered 2018-10-13: 2 g via INTRAVENOUS
  Filled 2018-10-13: qty 2

## 2018-10-13 MED ORDER — POLYETHYLENE GLYCOL 3350 17 G PO PACK
17.0000 g | PACK | Freq: Two times a day (BID) | ORAL | Status: DC
  Administered 2018-10-13 (×2): 17 g via ORAL
  Filled 2018-10-13 (×2): qty 1

## 2018-10-13 MED ORDER — VANCOMYCIN HCL IN DEXTROSE 1-5 GM/200ML-% IV SOLN
1000.0000 mg | INTRAVENOUS | Status: DC
  Administered 2018-10-14: 1000 mg via INTRAVENOUS
  Filled 2018-10-13: qty 200

## 2018-10-13 MED ORDER — SODIUM CHLORIDE 0.9 % IV SOLN
1.0000 g | Freq: Two times a day (BID) | INTRAVENOUS | Status: DC
  Administered 2018-10-13 – 2018-10-14 (×2): 1 g via INTRAVENOUS
  Filled 2018-10-13 (×3): qty 1

## 2018-10-13 MED ORDER — VANCOMYCIN HCL IN DEXTROSE 1-5 GM/200ML-% IV SOLN
1000.0000 mg | Freq: Once | INTRAVENOUS | Status: AC
  Administered 2018-10-13: 1000 mg via INTRAVENOUS
  Filled 2018-10-13: qty 200

## 2018-10-13 NOTE — Progress Notes (Signed)
Pharmacy Consult Note - Phenytoin  Labs: most recent corrected phenytoin level 14.4 on 1/12  A/P:   Phenytoin level at goal of 10-20 on current dosing of 100mg  IV q8. No changes  Recheck level on 1/20 if necessary  Pharmacy will hereafter leave notes only for adjustments or levels  Reuel Boom, PharmD, BCPS [phone] 10/13/2018, 2:15 PM

## 2018-10-13 NOTE — Progress Notes (Signed)
Daily Progress Note   Patient Name: Sean Murray       Date: 10/13/2018 DOB: 1956/06/07  Age: 63 y.o. MRN#: [id number] Attending Physician: Elodia Florence., * Primary Care Physician: [name], MD Admit Date: 10/06/2018  Reason for Consultation/Follow-up: Establishing goals of care  Subjective: Patient is sleepy today.  He does arouse briefly during physical examination, and he endorses pain when asked about pain in his belly.  He then goes back to sleep.   Close friend Tammy and Tammy's daughter are present at the bedside.  They shared with me about watching him decline, then get better earlier this week, and not appear to be less responsive again today.  He did eat this morning and was more awake per RN report.  See below.   Length of Stay: 8  Current Medications: Scheduled Meds:  . allopurinol  300 mg Oral q morning - 40J  . folic acid  1 mg Intravenous Daily  . magic mouthwash w/lidocaine  10 mL Oral QID  . mouth rinse  15 mL Mouth Rinse BID  . midodrine  10 mg Oral TID WC  . multivitamin with minerals  1 tablet Oral Daily  . mupirocin ointment   Topical BID  . phenytoin (DILANTIN) IV  100 mg Intravenous Q8H  . senna  1 tablet Oral BID  . thiamine injection  100 mg Intravenous Daily    Continuous Infusions: .  ceFAZolin (ANCEF) IV 2 g (10/13/18 0521)    PRN Meds: acetaminophen **OR** acetaminophen, bisacodyl, diphenhydrAMINE, HYDROcodone-acetaminophen, oxymetazoline, povidone-iodine  Physical Exam         Sleepy.  Arouses briefly but does not really participate in encounter. Regular Abdomen is not distended Has some edema S 1 S 2  Has low BP  Vital Signs: BP (!) 90/47 (BP Location: Right Arm)   Pulse 91   Temp 99.2 F (37.3 C) (Oral)   Resp 18   Ht 5\' 2"   (1.575 m)   Wt 52 kg   SpO2 98%   BMI 20.97 kg/m  SpO2: SpO2: 98 % O2 Device: O2 Device: Room Air O2 Flow Rate: O2 Flow Rate (L/min): 2 L/min  Intake/output summary:   Intake/Output Summary (Last 24 hours) at 10/13/2018 0836 Last data filed at 10/13/2018 0832 Gross per 24 hour  Intake 1691.57 ml  Output 2100 ml  Net -408.43 ml   LBM: Last BM Date: 10/10/18 Baseline Weight: Weight: 52 kg Most recent weight: Weight: 52 kg      PPS 40% Palliative Assessment/Data:    Flowsheet Rows     Most Recent Value  Intake Tab  Referral Department  Hospitalist  Unit at Time of Referral  ER  Date Notified  10/06/18  Palliative Care Type  New Palliative care  Reason for referral  Clarify Goals of Care  Date of Admission  10/20/2018  Date first seen by Palliative Care  10/06/18  # of days Palliative referral response time  0 Day(s)  # of days IP prior to Palliative referral  1  Clinical Assessment  Psychosocial & Spiritual Assessment  Palliative Care Outcomes      Patient Active Problem List   Diagnosis Date Noted  . Metabolic encephalopathy   . Pressure injury of skin 10/06/2018  . DNR (do not resuscitate)   . Dementia associated with other underlying disease without behavioral disturbance ([place])   . Palliative care encounter   . Acute renal failure (ARF) ([place]) 10/04/2018  . Dehydration 10/08/2018  . QT prolongation 10/22/2018  . Hypernatremia 10/24/2018  . Down's syndrome 04/13/2017  . Memory disorder 04/13/2017  . HOH (hard of hearing) 04/13/2017  . Generalized abdominal pain 12/23/2016  . Abnormal findings-gastrointestinal tract 12/23/2016    Palliative Care Assessment & Plan   Patient Profile:    Assessment:  63 yo gentleman from group home, has MR, admitted with renal failure, generalized weakness, poor PO intake, oral ulcers.  PMT following for symptom management and goals of care discussions.    Recommendations/Plan: - It appears that [name] is having waxing  and waning status and I am concerned this is possible related to delirium.  He is having periods where he is awake and able to take in nutrition and hydration. - Standard delirium management (adapted from NICE guidelines 2011 for prevention of delirium):  Provide continuity of care when possible (avoid frequent changing of surroundings and staff).  Frequent reorientation to time with:  A clock should always be visible.  Make sure Calendar/white board is updated.  Lights on/blinds open during the day and off/closed at night.  Encourage frequent family visits.  Monitor for and treat dehydration/constipation.  Optimize oxygen saturation.  Avoid catheters and IV's when possible and look for/treat infections.  Encourage early mobility.  Assess and treat pain.  Ensure adequate nutrition and functioning dentures.  Address reversible causes of hearing and visual impairment:  Use pocket talker if hearing aids are unavailable.  Avoid sleep disturbance (normalize sleep/wake cycle).  Minimize disturbances and consider NOT obtaining vitals at night if possible.  Review Medications to avoid polypharmacy and avoid deliriogenic medications when possible:  Benzodiazepines.  Dihydropyridines.  Antihistamines.  Anticholinergics.  (Possibly avoid: H2 blockers, tricyclic antidepressants, antiparkinson medications, steroids, NSAID's).   - Pain: Endorsed pain when asked, but then fell back to sleep.  Continue judicious use of opioids.  Note small BM charted x 2 days. Consider constipation may be part of the issue.  Would consider x-ray if not improved tomorrow.   - Sister wants patient to be moved closer to [place], or [place] so as to be closer to her. His other sister and brother also live over there.   - Continue current mode of care. CSW follow up requested to initiate search for SNF near legal guardian.    Code Status:    Code Status Orders  (  From admission, onward)         Start      Ordered   10/06/18 1446  Do not attempt resuscitation (DNR)  Continuous    Question Answer Comment  In the event of cardiac or respiratory ARREST Do not call a "code blue"   In the event of cardiac or respiratory ARREST Do not perform Intubation, CPR, defibrillation or ACLS   In the event of cardiac or respiratory ARREST Use medication by any route, position, wound care, and other measures to relive pain and suffering. May use oxygen, suction and manual treatment of airway obstruction as needed for comfort.   Comments Per Guardian Fenton Malling.  DNR but full scope treatment otherwise.      10/06/18 1446        Code Status History    Date Active Date Inactive Code Status Order ID Comments User Context   10/01/2018 2055 10/06/2018 1446 Full Code [id number]  Toy Baker, MD Inpatient       Prognosis:   Unable to determine  Discharge Planning:  SNF with palliative care following,near [place], [place] as per his guardian sister Noralee Space preference.   Care plan was discussed with Dr. Florene Glen Thank you for allowing the Palliative Medicine Team to assist in the care of this patient.   Total Time 35 Prolonged Time Billed  no       Greater than 50%  of this time was spent counseling and coordinating care related to the above assessment and plan.  Micheline Rough, MD  Please contact Palliative Medicine Team phone at [phone] for questions and concerns.

## 2018-10-13 NOTE — Progress Notes (Signed)
Pharmacy Antibiotic Note  Sean Murray is a 63 y.o. male admitted on 10/20/2018 with progressive failure to thrive. Hospital course complicated by DVT, cellulitis, and now multilobar PNA. Pharmacy has been consulted for vancomycin and cefepime dosing.  Plan:  Vancomycin 1000 mg IV now, then 1000 mg IV q24 hr (est AUC 534 based on SCr 1)  Measure vancomycin AUC at steady state as indicated  Cefepime 1 g IV q12 hr  SCr q48 hr while on vanc  F/u MRSA PCR  Height: 5\' 2"  (157.5 cm) Weight: 114 lb 10.2 oz (52 kg) IBW/kg (Calculated) : 54.6  Temp (24hrs), Avg:100.5 F (38.1 C), Min:99.2 F (37.3 C), Max:102.3 F (39.1 C)  Recent Labs  Lab 10/09/18 0310 10/09/18 0854 10/10/18 0308 10/11/18 0236 10/12/18 0445 10/13/18 0755  WBC 13.5*  --  19.7* 14.8* 10.4 12.8*  CREATININE 0.92  --  0.90 0.90 0.90 1.01  LATICACIDVEN  --  1.4  --   --   --   --     Estimated Creatinine Clearance: 54.3 mL/min (by C-G formula based on SCr of 1.01 mg/dL).    Allergies  Allergen Reactions  . Amoxicillin Rash    Unsure wasn't listed on [facility] 01/29/17.  Severe rash Unsure wasn't listed on [facility] 01/29/17.  Severe rash  . Nsaids Other (See Comments)    Avoid NSAIDS--  Strong family hx liver cancer Avoid NSAIDS--  Strong family hx liver cancer      Thank you for allowing pharmacy to be a part of this patient's care.  Shannie Kontos A 10/13/2018 2:11 PM

## 2018-10-13 NOTE — Progress Notes (Signed)
Daily Progress Note   Patient Name: Sean Murray       Date: 10/13/2018 DOB: 08-01-1956  Age: 63 y.o. MRN#: [id number] Attending Physician: Elodia Florence., * Primary Care Physician: [name], MD Admit Date: 10/07/2018  Reason for Consultation/Follow-up: Establishing goals of care  Subjective: Patient is sleepy today.  He is more restless and with secretions with concern for gross aspiration.  Close friend Tammy and Tammy's mother are present at the bedside.  They feel that he is worsening and are concerned that he does not suffer.  I called and spoke with his sister/HCPOA via phone.  See below.   Length of Stay: 8  Current Medications: Scheduled Meds:  . allopurinol  300 mg Oral q morning - 49Q  . folic acid  1 mg Intravenous Daily  . magic mouthwash w/lidocaine  10 mL Oral QID  . mouth rinse  15 mL Mouth Rinse BID  . midodrine  10 mg Oral TID WC  . multivitamin with minerals  1 tablet Oral Daily  . mupirocin ointment   Topical BID  . phenytoin (DILANTIN) IV  100 mg Intravenous Q8H  . polyethylene glycol  17 g Oral BID  . senna  1 tablet Oral BID  . thiamine injection  100 mg Intravenous Daily    Continuous Infusions: . ceFEPime (MAXIPIME) IV    . [START ON 10/14/2018] vancomycin      PRN Meds: acetaminophen **OR** acetaminophen, bisacodyl, diphenhydrAMINE, HYDROcodone-acetaminophen, LORazepam, oxymetazoline, povidone-iodine  Physical Exam         Sleepy.  Does not participate in encounter. Regular Abdomen is not distended Has some edema S 1 S 2  Has low BP  Vital Signs: BP 94/71   Pulse (!) 106   Temp 99.6 F (37.6 C) (Axillary)   Resp (!) 22   Ht 5\' 2"  (1.575 m)   Wt 52 kg   SpO2 98%   BMI 20.97 kg/m  SpO2: SpO2: 98 % O2 Device: O2 Device: Room  Air O2 Flow Rate: O2 Flow Rate (L/min): 2 L/min  Intake/output summary:   Intake/Output Summary (Last 24 hours) at 10/13/2018 1644 Last data filed at 10/13/2018 1515 Gross per 24 hour  Intake 1473.68 ml  Output 1720 ml  Net -246.32 ml   LBM: Last BM Date: 10/10/18 Baseline Weight: Weight:  52 kg Most recent weight: Weight: 52 kg      PPS 40% Palliative Assessment/Data:    Flowsheet Rows     Most Recent Value  Intake Tab  Referral Department  Hospitalist  Unit at Time of Referral  ER  Date Notified  10/06/18  Palliative Care Type  New Palliative care  Reason for referral  Clarify Goals of Care  Date of Admission  10/21/2018  Date first seen by Palliative Care  10/06/18  # of days Palliative referral response time  0 Day(s)  # of days IP prior to Palliative referral  1  Clinical Assessment  Psychosocial & Spiritual Assessment  Palliative Care Outcomes      Patient Active Problem List   Diagnosis Date Noted  . Metabolic encephalopathy   . Pressure injury of skin 10/06/2018  . DNR (do not resuscitate)   . Dementia associated with other underlying disease without behavioral disturbance ([place])   . Palliative care encounter   . Acute renal failure (ARF) ([place]) 09/30/2018  . Dehydration 10/16/2018  . QT prolongation 10/23/2018  . Hypernatremia 10/20/2018  . Down's syndrome 04/13/2017  . Memory disorder 04/13/2017  . HOH (hard of hearing) 04/13/2017  . Generalized abdominal pain 12/23/2016  . Abnormal findings-gastrointestinal tract 12/23/2016    Palliative Care Assessment & Plan   Patient Profile:    Assessment:  63 yo gentleman from group home, has MR, admitted with renal failure, generalized weakness, poor PO intake, oral ulcers.  PMT following for symptom management and goals of care discussions.    Recommendations/Plan: - It appears that Sean Murray is having waxing and waning status and I am concerned this is possible related to delirium.  He is having periods  where he is awake and able to take in nutrition and hydration. - Standard delirium management (adapted from NICE guidelines 2011 for prevention of delirium):  Provide continuity of care when possible (avoid frequent changing of surroundings and staff).  Frequent reorientation to time with:  A clock should always be visible.  Make sure Calendar/white board is updated.  Lights on/blinds open during the day and off/closed at night.  Encourage frequent family visits.  Monitor for and treat dehydration/constipation.  Optimize oxygen saturation.  Avoid catheters and IV's when possible and look for/treat infections.  Encourage early mobility.  Assess and treat pain.  Ensure adequate nutrition and functioning dentures.  Address reversible causes of hearing and visual impairment:  Use pocket talker if hearing aids are unavailable.  Avoid sleep disturbance (normalize sleep/wake cycle).  Minimize disturbances and consider NOT obtaining vitals at night if possible.  Review Medications to avoid polypharmacy and avoid deliriogenic medications when possible:  Benzodiazepines.  Dihydropyridines.  Antihistamines.  Anticholinergics.  (Possibly avoid: H2 blockers, tricyclic antidepressants, antiparkinson medications, steroids, NSAID's).   - Pain: Unable to determine if he is in pain today as he appears restless.  Continue judicious use of opioids.  - He appears worse to me overall today.  Labs reveal increased WBC, decreased Hgb.  X ray with concern for multifocal PNA.  Discussed with his sister who reports understanding that we may be approaching end of life.  She is going to try to get a ride to visit tonight or tomorrow.  Plan to continue with blood transfusion and widen antibiotic coverage and reassess in 24 hours.  If he worsens or fails to improve, his sister reports that she wants to make sure he is out of pain and not suffering and may want to transition  to full comfort care.   Code Status:      Code Status Orders  (From admission, onward)         Start     Ordered   10/06/18 1446  Do not attempt resuscitation (DNR)  Continuous    Question Answer Comment  In the event of cardiac or respiratory ARREST Do not call a "code blue"   In the event of cardiac or respiratory ARREST Do not perform Intubation, CPR, defibrillation or ACLS   In the event of cardiac or respiratory ARREST Use medication by any route, position, wound care, and other measures to relive pain and suffering. May use oxygen, suction and manual treatment of airway obstruction as needed for comfort.   Comments Per Guardian Fenton Malling.  DNR but full scope treatment otherwise.      10/06/18 1446        Code Status History    Date Active Date Inactive Code Status Order ID Comments User Context   10/26/2018 2055 10/06/2018 1446 Full Code [id number]  Toy Baker, MD Inpatient       Prognosis:   Unable to determine  Discharge Planning:  To be determined   Care plan was discussed with Dr. Florene Glen Thank you for allowing the Palliative Medicine Team to assist in the care of this patient.   Total Time 35 Prolonged Time Billed  no       Greater than 50%  of this time was spent counseling and coordinating care related to the above assessment and plan.  Micheline Rough, MD  Please contact Palliative Medicine Team phone at [phone] for questions and concerns.

## 2018-10-13 NOTE — Progress Notes (Signed)
PROGRESS NOTE    Sean Murray  HCW:[id number] DOB: [birth year] DOA: 10/27/2018 PCP: [name], MD   Brief Narrative:  63 year old male with Sean Murray past medical history of mental retardation who lives in [name] who is wheelchair-bound at baseline.  Her legal guardian who is apparently his sister who lives in [place].  Patient was brought into the hospital due to decreased oral intake for the past 3 days.  Apparently patient has dementia at baseline and has been progressively getting worse over the past many months.  Assessment & Plan:   Active Problems:   Down's syndrome   Acute renal failure (ARF) (HCC)   Dehydration   QT prolongation   Hypernatremia   Pressure injury of skin   DNR (do not resuscitate)   Dementia associated with other underlying disease without behavioral disturbance ([place])   Palliative care encounter   Metabolic encephalopathy  Multifocal Pneumonia  Recurrent Fever  Concern for aspiration: CXR with multifocal pneumonia Follow blood cx Vanc, cefepime, narrow as able Follow repeat MRSA PCR SLP eval  Fever  Cellulitis versus Thrombophlebitis of the right upper extremity Patient had an IV access in the right upper extremity which infiltrated.  Doppler shows acute superficial thrombophlebitis.  This seems improved. Pt on ancef from 1/12 - 1/16 -> will d/c in setting of above MRSA PCR negative 1/2 blood cx with coagulase negative staph - suspect this is contaminant, but will plan to repeat cx once course of abx is complete for cellulitis (discussed with ID) (follow repeat blood cx)  Hypotension, etiology unclear BP fluctuating, apparently pt with hx of chronic hypotension Presented with BP's in 70's and 80's Improved today Etiology was not immediately clear at admission.  No infection was present at the time of admission.   Cortisol level was normal (13.6), making adrenal insufficiency unlikely.  Lactic acid level was normal.   Echo with EF  40-45% with hypokinesis of anterior wall, anteroseptum and anterolateral walls - difficult to tell if related to LBBB or regional wall motion abnormality (per report, see below) BP's have improved with midodrine and treatment of his infection above   Chronic systolic CHF Diminished EF noted on echocardiogram.  No evidence for acute exacerbation.   Report as noted below Due to his other comorbidities and poor baseline functional status he is not [name] candidate for further cardiac work-up or treatment.  Acute pulmonary embolism and lower extremity DVT Patient was found to have acute DVT in the lower extremity.   CT PE protocol with right lower lobe PE without evidence of right heart strain Pt on heparin - holding now, with downtrending H/H - discussed with sister  It appears that his oral intake has improved.  If he is consistently is able to take oral medications then he could be transitioned to Xarelto or Eliquis tomorrow.  Epistaxis has resolved.   Macrocytic anemia hemoglobin has been downtrending since 1/13 (but it was ~7.8 on 1/12).   Hb down to 6.4 today, transfuse and hold heparin as noted above No obvious signs bleeding FOBT Labs suggestive of AOCD with iron deficiency.  Normal folate/b12.  No signs of bleeding and epistaxis seems resolved.    Delirium: pt agitated this morning, likely in setting of pneumonia and acute hospitalization.   Delirium precautions Will decrease norco dose as this seemed to cause sleepiness Ativan ordered prn agitation (qt prolonged on initial EKG, unable to repeat today per nurse due to his agitation, follow)  Acute epistaxis Possibly related  to heparin.  Was placed on Afrin nasal spray.  Resolved.   Urinary Retention: Foley placed, will continue to monitor Renal function appears stable  Acute renal failure with hypernatremia and hypokalemia and hypomagnesemia This was most likely due to poor oral intake over the past many days prior to admission.   Renal function is now normal.  Sodium and potassium levels have improved.  Magnesium is normal.     Mild rhabdomyolysis CK level was noted to be elevated.  Improved with hydration.  Levels have improved.  QT prolongation Avoid QT prolonging medications.  Monitor on telemetry.  Mildly abnormal LFTs Most likely in the setting of rhabdomyolysis.  Hepatitis panel is unremarkable.  Stable.  Patient with known history of cholelithiasis.  However his right upper quadrant is benign on examination.  Do not anticipate any further testing at this time. CTM  History of seizure disorder EEG was done which revealed epileptiform activity.  Unclear if this was clinically significant.  However patient's Dilantin level was subtherapeutic at admission.  After discussions with neurology [name] loading dose of Dilantin was given.  No repeat EEG per neurology.  Dilantin level was therapeutic on 1/12 when adjusted for low albumin.  Continue current dose of phenytoin.  Change to oral when he is able to take oral medications consistently. May need to consider discussing with neurology again if not improving (neurology note from 1/10), but for now, continue to monitor  Acute metabolic encephalopathy Patient with known history of mental retardation.  See above under seizure disorder as well.  Persistent delirium as noted above.  Hypoalbuminemia/severe protein calorie malnutrition Oral intake has been poor.  Now improving.  History of mental retardation Patient has poor quality of life at baseline.  Apparently has been declining over the past several months.  Has had falls.  He is now wheelchair-bound.  Palliative medicine is following.  Discussions held with the patient's legal guardian.  He is now DNR.  For many days patient did not show any improvement but in the last 24 hours he appears to have perked up more.  His blood pressures have improved.  Palliative medicine continues to follow.   Goals of care: pt with  generally poor prognosis with multifocal pneumonia.  Some concern for aspiration as well.  Planning to treat treatable for now per discussion with sister, but if not improving, may discuss transitioning to comfort care.   DVT prophylaxis: heparin, on hold Code Status: DNR Family Communication: family friend Tammy at bedside, sister over phone Disposition Plan: pending improvement   Consultants:   neurology  Procedures:  Echo Study Conclusions  - Left ventricle: Systolic function was mildly to moderately   reduced. The estimated ejection fraction was in the range of 40%   to 45%. Cannot exclude hypokinesis of the anteroseptal, anterior,   and anterolateral myocardium. - Ventricular septum: Septal motion showed dyssynergy. These   changes are consistent with Elyanah Farino left bundle branch block. - Impressions: Incomplete sutdy as patient terminated study prior   to exam completion. On available images EF appears to be in the   40-45% range with hypokinesis of the anterior wall, aanteroseptum   and anterolateral walls. As there are no 2-chamber images hard to   tell if this is all due to LBBB or if there is also Octave Montrose regional   wall motion abnormlaity due to previous infarct.  Impressions:  - Incomplete sutdy as patient terminated study prior to exam   completion. On available images EF appears to  be in the 40-45%   range with hypokinesis of the anterior wall, aanteroseptum and   anterolateral walls. As there are no 2-chamber images hard to   tell if this is all due to LBBB or if there is also Glyn Zendejas regional   wall motion abnormlaity due to previous infarct.   Antimicrobials:  Anti-infectives (From admission, onward)   Start     Dose/Rate Route Frequency Ordered Stop   10/14/18 0800  vancomycin (VANCOCIN) IVPB 1000 mg/200 mL premix     1,000 mg 200 mL/hr over 60 Minutes Intravenous Every 24 hours 10/13/18 1410     10/13/18 2200  ceFEPIme (MAXIPIME) 1 g in sodium chloride 0.9 % 100 mL  IVPB     1 g 200 mL/hr over 30 Minutes Intravenous Every 12 hours 10/13/18 1410     10/13/18 1315  vancomycin (VANCOCIN) IVPB 1000 mg/200 mL premix     1,000 mg 200 mL/hr over 60 Minutes Intravenous  Once 10/13/18 1300 10/13/18 1555   10/13/18 1300  ceFEPIme (MAXIPIME) 2 g in sodium chloride 0.9 % 100 mL IVPB     2 g 200 mL/hr over 30 Minutes Intravenous  Once 10/13/18 1300 10/13/18 1516   10/10/18 2200  ceFAZolin (ANCEF) IVPB 2g/100 mL premix  Status:  Discontinued     2 g 200 mL/hr over 30 Minutes Intravenous Every 8 hours 10/10/18 1510 10/13/18 1252   10/09/18 1400  ceFAZolin (ANCEF) IVPB 1 g/50 mL premix  Status:  Discontinued     1 g 100 mL/hr over 30 Minutes Intravenous Every 8 hours 10/09/18 1256 10/10/18 1510         Subjective: Agitated, restless Doesn't say much to me  Objective: Vitals:   10/13/18 1437 10/13/18 1515 10/13/18 1555 10/13/18 1832  BP: (!) 102/54 (!) 100/57 94/71 105/62  Pulse: 92 (!) 103 (!) 106 84  Resp:  (!) 22 (!) 22 18  Temp: 100 F (37.8 C) 99.9 F (37.7 C) 99.6 F (37.6 C) 99.5 F (37.5 C)  TempSrc: Oral Axillary Axillary Axillary  SpO2:    100%  Weight:      Height:        Intake/Output Summary (Last 24 hours) at 10/13/2018 2120 Last data filed at 10/13/2018 1832 Gross per 24 hour  Intake 1786.8 ml  Output 1820 ml  Net -33.2 ml   Filed Weights   10/06/18 0700  Weight: 52 kg    Examination:  General: agitated Cardiovascular: Heart sounds show Susane Bey regular rate, and rhythm Lungs: rattling upper airway sounds, not on O2 Abdomen: Soft, nontender, nondistended  Neurological: agitated, moving all extremities Skin: Warm and dry. No rashes or lesions. Extremities: No clubbing or cyanosis. No edema.      Data Reviewed: I have personally reviewed following labs and imaging studies  CBC: Recent Labs  Lab 10/09/18 0310 10/10/18 0308 10/11/18 0236 10/12/18 0445 10/12/18 1538 10/12/18 1836 10/13/18 0755  WBC 13.5* 19.7* 14.8*  10.4  --   --  12.8*  HGB 7.8* 9.0* 8.3* 7.9* 7.1* 7.7* 6.4*  HCT 26.1* 29.3* 27.4* 25.4* 23.4* 25.4* 20.8*  MCV 108.3* 107.7* 106.6* 105.4*  --   --  107.2*  PLT 194 238 280 280  --   --  798   Basic Metabolic Panel: Recent Labs  Lab 10/09/18 0310 10/10/18 0308 10/11/18 0236 10/12/18 0445 10/13/18 0755  NA 142 141 138 136 136  K 3.2* 3.9 3.8 3.9 4.0  CL 116* 113* 112* 104 103  CO2  21* 21* 20* 23 23  GLUCOSE 118* 85 96 107* 109*  BUN 11 11 9 8 11   CREATININE 0.92 0.90 0.90 0.90 1.01  CALCIUM 6.6* 6.9* 6.9* 7.1* 6.8*  MG 1.5* 2.0  --   --  1.8   GFR: Estimated Creatinine Clearance: 54.3 mL/min (by C-G formula based on SCr of 1.01 mg/dL). Liver Function Tests: Recent Labs  Lab 10/07/18 0253 10/08/18 0011 10/09/18 0310 10/13/18 0755  AST 64* 79* 50* 48*  ALT 40 46* 37 17  ALKPHOS 67 103 108 145*  BILITOT 0.5 0.4 0.3 0.3  PROT 4.7* 4.6* 4.1* 4.8*  ALBUMIN 1.6* 1.6* 1.3* 1.4*   No results for input(s): LIPASE, AMYLASE in the last 168 hours. No results for input(s): AMMONIA in the last 168 hours. Coagulation Profile: No results for input(s): INR, PROTIME in the last 168 hours. Cardiac Enzymes: Recent Labs  Lab 10/08/18 0011  CKTOTAL 528*   BNP (last 3 results) No results for input(s): PROBNP in the last 8760 hours. HbA1C: No results for input(s): HGBA1C in the last 72 hours. CBG: No results for input(s): GLUCAP in the last 168 hours. Lipid Profile: No results for input(s): CHOL, HDL, LDLCALC, TRIG, CHOLHDL, LDLDIRECT in the last 72 hours. Thyroid Function Tests: No results for input(s): TSH, T4TOTAL, FREET4, T3FREE, THYROIDAB in the last 72 hours. Anemia Panel: No results for input(s): VITAMINB12, FOLATE, FERRITIN, TIBC, IRON, RETICCTPCT in the last 72 hours. Sepsis Labs: Recent Labs  Lab 10/09/18 0854  LATICACIDVEN 1.4    Recent Results (from the past 240 hour(s))  Urine culture     Status: None   Collection Time: 10/17/2018  3:32 PM  Result Value Ref  Range Status   Specimen Description   Final    URINE, CATHETERIZED Performed at [facility] [address] [place], [place] [address]    Special Requests   Final    NONE Performed at [facility], [place] [address] [place], [place] [address]    Culture   Final    NO GROWTH Performed at [facility], [place] [address], [place], [place] [address]    Report Status 10/07/2018 FINAL  Final  Blood Culture (routine x 2)     Status: None   Collection Time: 10/02/2018  4:50 PM  Result Value Ref Range Status   Specimen Description   Final    BLOOD RIGHT ARM Performed at [facility] [address] [place], [place] [address]    Special Requests   Final    BOTTLES DRAWN AEROBIC AND ANAEROBIC Blood Culture adequate volume   Culture   Final    NO GROWTH 5 DAYS Performed at [facility], [place] [address], [place], [place] [address]    Report Status 10/10/2018 FINAL  Final  Blood Culture (routine x 2)     Status: None   Collection Time: 10/27/2018  5:06 PM  Result Value Ref Range Status   Specimen Description BLOOD BLOOD LEFT FOREARM  Final   Special Requests   Final    BOTTLES DRAWN AEROBIC AND ANAEROBIC Blood Culture adequate volume Performed at [facility] [address] [place], [place] [address]    Culture   Final    NO GROWTH 5 DAYS Performed at [facility], [place] [address], [place], [place] [address]    Report Status 10/10/2018 FINAL  Final  MRSA PCR Screening     Status: None  Collection Time: 10/02/2018  8:57 PM  Result Value Ref Range Status   MRSA by PCR NEGATIVE NEGATIVE Final    Comment:        The GeneXpert MRSA Assay (FDA approved for NASAL specimens only), is one component of Keishaun Hazel comprehensive MRSA colonization surveillance program. It is not intended to diagnose MRSA infection nor to guide or monitor treatment for MRSA infections. Performed at [facility],  [place] [address] [place], [place] [address]   Culture, blood (Routine X 2) w Reflex to ID Panel     Status: None (Preliminary result)   Collection Time: 10/09/18  8:54 AM  Result Value Ref Range Status   Specimen Description   Final    BLOOD RIGHT HAND Performed at [facility] [address] [place], [place] [address]    Special Requests   Final    BOTTLES DRAWN AEROBIC ONLY Blood Culture adequate volume Performed at [facility] [address], [place], [place] [address]    Culture   Final    NO GROWTH 4 DAYS Performed at [facility], [place] [address] [place], [place] [address]    Report Status PENDING  Incomplete  Culture, blood (Routine X 2) w Reflex to ID Panel     Status: Abnormal   Collection Time: 10/09/18  8:54 AM  Result Value Ref Range Status   Specimen Description BLOOD RIGHT ARM  Final   Special Requests   Final    BOTTLES DRAWN AEROBIC ONLY Blood Culture results may not be optimal due to an inadequate volume of blood received in culture bottles Performed at [facility], [place] [address], [place], [place] [address]    Culture  Setup Time   Final    GRAM POSITIVE COCCI AEROBIC BOTTLE ONLY Organism ID to follow CRITICAL RESULT CALLED TO, READ BACK BY AND VERIFIED WITH: Shelda Jakes PharmD 14:55 10/10/18 ([email or web address])    Culture (Praveen Coia)  Final    STAPHYLOCOCCUS SPECIES (COAGULASE NEGATIVE) THE SIGNIFICANCE OF ISOLATING THIS ORGANISM FROM Aryannah Mohon SINGLE SET OF BLOOD CULTURES WHEN MULTIPLE SETS ARE DRAWN IS UNCERTAIN. PLEASE NOTIFY THE MICROBIOLOGY DEPARTMENT WITHIN ONE WEEK IF SPECIATION AND SENSITIVITIES ARE REQUIRED. Performed at [facility], [place] [address] [place],  [address]    Report Status 10/12/2018 FINAL  Final  Blood Culture ID Panel (Reflexed)     Status: Abnormal   Collection Time: 10/09/18  8:54 AM  Result Value Ref Range Status   Enterococcus species NOT DETECTED NOT DETECTED Final   Listeria  monocytogenes NOT DETECTED NOT DETECTED Final   Staphylococcus species DETECTED (Aanvi Voyles) NOT DETECTED Final    Comment: Methicillin (oxacillin) susceptible coagulase negative staphylococcus. Possible blood culture contaminant (unless isolated from more than one blood culture draw or clinical case suggests pathogenicity). No antibiotic treatment is indicated for blood  culture contaminants. CRITICAL RESULT CALLED TO, READ BACK BY AND VERIFIED WITH: Shelda Jakes PharmD 14:55 10/10/18 ([email or web address])    Staphylococcus aureus (BCID) NOT DETECTED NOT DETECTED Final   Methicillin resistance NOT DETECTED NOT DETECTED Final   Streptococcus species NOT DETECTED NOT DETECTED Final   Streptococcus agalactiae NOT DETECTED NOT DETECTED Final   Streptococcus pneumoniae NOT DETECTED NOT DETECTED Final   Streptococcus pyogenes NOT DETECTED NOT DETECTED Final   Acinetobacter baumannii NOT DETECTED NOT DETECTED Final   Enterobacteriaceae species NOT DETECTED NOT DETECTED Final   Enterobacter cloacae complex NOT DETECTED NOT DETECTED Final   Escherichia coli NOT  DETECTED NOT DETECTED Final   Klebsiella oxytoca NOT DETECTED NOT DETECTED Final   Klebsiella pneumoniae NOT DETECTED NOT DETECTED Final   Proteus species NOT DETECTED NOT DETECTED Final   Serratia marcescens NOT DETECTED NOT DETECTED Final   Haemophilus influenzae NOT DETECTED NOT DETECTED Final   Neisseria meningitidis NOT DETECTED NOT DETECTED Final   Pseudomonas aeruginosa NOT DETECTED NOT DETECTED Final   Candida albicans NOT DETECTED NOT DETECTED Final   Candida glabrata NOT DETECTED NOT DETECTED Final   Candida krusei NOT DETECTED NOT DETECTED Final   Candida parapsilosis NOT DETECTED NOT DETECTED Final   Candida tropicalis NOT DETECTED NOT DETECTED Final    Comment: Performed at [facility], [place] [address] [place], [place] [address]  MRSA PCR Screening     Status: None   Collection Time: 10/13/18  3:15 PM  Result Value Ref Range Status   MRSA  by PCR NEGATIVE NEGATIVE Final    Comment:        The GeneXpert MRSA Assay (FDA approved for NASAL specimens only), is one component of Caroleen Stoermer comprehensive MRSA colonization surveillance program. It is not intended to diagnose MRSA infection nor to guide or monitor treatment for MRSA infections. Performed at [facility], [place] [address] [place], [place] [address]          Radiology Studies: Dg Abd 1 View  Result Date: 10/13/2018 CLINICAL DATA:  Abdominal pain EXAM: ABDOMEN - 1 VIEW COMPARISON:  10/25/2018 FINDINGS: Scattered large and small bowel gas is noted. Gallstone is again seen. No findings of constipation or obstruction are noted. Degenerative change of the lumbar spine is again seen. IMPRESSION: Chronic changes without acute abnormality. Electronically Signed   By: Inez Catalina M.D.   On: 10/13/2018 10:32   Dg Chest Port 1 View  Result Date: 10/13/2018 CLINICAL DATA:  Fever EXAM: PORTABLE CHEST 1 VIEW COMPARISON:  10/27/2018 FINDINGS: Heart is normal size. There are bilateral perihilar and lower lobe airspace opacities with small effusions. No acute bony abnormality. IMPRESSION: Bilateral mid and lower lung airspace opacities with small effusions. Given the normal heart size, findings are suspicious for multifocal pneumonia. Electronically Signed   By: Rolm Baptise M.D.   On: 10/13/2018 10:30        Scheduled Meds: . allopurinol  300 mg Oral q morning - 30Q  . folic acid  1 mg Intravenous Daily  . magic mouthwash w/lidocaine  10 mL Oral QID  . mouth rinse  15 mL Mouth Rinse BID  . midodrine  10 mg Oral TID WC  . multivitamin with minerals  1 tablet Oral Daily  . mupirocin ointment   Topical BID  . phenytoin (DILANTIN) IV  100 mg Intravenous Q8H  . polyethylene glycol  17 g Oral BID  . senna  1 tablet Oral BID  . thiamine injection  100 mg Intravenous Daily   Continuous Infusions: . ceFEPime (MAXIPIME) IV    . [START ON 10/14/2018] vancomycin        LOS: 8 days    Time spent: over 30 min    Fayrene Helper, MD [facility] Pager see AMION for pager  If 7PM-7AM, please contact night-coverage [email or web address] Password [email or web address] 10/13/2018, 9:20 PM

## 2018-10-14 DIAGNOSIS — J189 Pneumonia, unspecified organism: Secondary | ICD-10-CM

## 2018-10-14 LAB — COMPREHENSIVE METABOLIC PANEL
ALT: 14 U/L (ref 0–44)
ANION GAP: 8 (ref 5–15)
AST: 47 U/L — ABNORMAL HIGH (ref 15–41)
Albumin: 1.4 g/dL — ABNORMAL LOW (ref 3.5–5.0)
Alkaline Phosphatase: 113 U/L (ref 38–126)
BUN: 13 mg/dL (ref 8–23)
CO2: 22 mmol/L (ref 22–32)
Calcium: 6.8 mg/dL — ABNORMAL LOW (ref 8.9–10.3)
Chloride: 103 mmol/L (ref 98–111)
Creatinine, Ser: 1.06 mg/dL (ref 0.61–1.24)
GFR calc Af Amer: >60 mL/min (ref >60)
GFR calc non Af Amer: >60 mL/min (ref >60)
Glucose, Bld: 102 mg/dL — ABNORMAL HIGH (ref 70–99)
Potassium: 4 mmol/L (ref 3.5–5.1)
Sodium: 133 mmol/L — ABNORMAL LOW (ref 135–145)
Total Bilirubin: 0.7 mg/dL (ref 0.3–1.2)
Total Protein: 4.5 g/dL — ABNORMAL LOW (ref 6.5–8.1)

## 2018-10-14 LAB — PREPARE RBC (CROSSMATCH)

## 2018-10-14 LAB — CBC
HCT: 21.1 % — ABNORMAL LOW (ref 39.0–52.0)
Hemoglobin: 6.6 g/dL — CL (ref 13.0–17.0)
MCH: 31.4 pg (ref 26.0–34.0)
MCHC: 31.3 g/dL (ref 30.0–36.0)
MCV: 100.5 fL — ABNORMAL HIGH (ref 80.0–100.0)
PLATELETS: 234 10*3/uL (ref 150–400)
RBC: 2.1 MIL/uL — ABNORMAL LOW (ref 4.22–5.81)
RDW: 18.4 % — ABNORMAL HIGH (ref 11.5–15.5)
WBC: 10.6 10*3/uL — ABNORMAL HIGH (ref 4.0–10.5)
nRBC: 0 % (ref 0.0–0.2)

## 2018-10-14 LAB — URINE CULTURE: CULTURE: NO GROWTH

## 2018-10-14 LAB — MAGNESIUM: Magnesium: 1.8 mg/dL (ref 1.7–2.4)

## 2018-10-14 LAB — CULTURE, BLOOD (ROUTINE X 2)
Culture: NO GROWTH
Special Requests: ADEQUATE

## 2018-10-14 MED ORDER — SODIUM CHLORIDE 0.9% FLUSH: 3.0000 mL | INTRAVENOUS | Status: DC | PRN

## 2018-10-14 MED ORDER — LORAZEPAM 2 MG/ML PO CONC: 1.0000 mg | ORAL | Status: DC | PRN

## 2018-10-14 MED ORDER — LORAZEPAM 1 MG PO TABS: 1.0000 mg | ORAL_TABLET | ORAL | Status: DC | PRN

## 2018-10-14 MED ORDER — GLYCOPYRROLATE 1 MG PO TABS
1.0000 mg | ORAL_TABLET | ORAL | Status: DC | PRN
  Filled 2018-10-14: qty 1

## 2018-10-14 MED ORDER — SODIUM CHLORIDE 0.9 % IV BOLUS
1000.0000 mL | Freq: Once | INTRAVENOUS | Status: AC
  Administered 2018-10-14: 1000 mL via INTRAVENOUS

## 2018-10-14 MED ORDER — BIOTENE DRY MOUTH MT LIQD: 15.0000 mL | OROMUCOSAL | Status: DC | PRN

## 2018-10-14 MED ORDER — ONDANSETRON 4 MG PO TBDP: 4.0000 mg | ORAL_TABLET | Freq: Four times a day (QID) | ORAL | Status: DC | PRN

## 2018-10-14 MED ORDER — SODIUM CHLORIDE 0.9 % IV SOLN: 250.0000 mL | INTRAVENOUS | Status: DC | PRN

## 2018-10-14 MED ORDER — GLYCOPYRROLATE 0.2 MG/ML IJ SOLN
0.2000 mg | INTRAMUSCULAR | Status: DC | PRN
  Filled 2018-10-14: qty 1

## 2018-10-14 MED ORDER — GLYCOPYRROLATE 0.2 MG/ML IJ SOLN
0.2000 mg | INTRAMUSCULAR | Status: DC | PRN
  Administered 2018-10-14 (×2): 0.2 mg via INTRAVENOUS
  Filled 2018-10-14 (×4): qty 1

## 2018-10-14 MED ORDER — HYDROMORPHONE HCL 1 MG/ML IJ SOLN
0.5000 mg | INTRAMUSCULAR | Status: DC | PRN
  Administered 2018-10-14 – 2018-10-16 (×14): 0.5 mg via INTRAVENOUS
  Filled 2018-10-14 (×15): qty 0.5

## 2018-10-14 MED ORDER — LORAZEPAM 2 MG/ML IJ SOLN
1.0000 mg | INTRAMUSCULAR | Status: DC | PRN
  Administered 2018-10-14 – 2018-10-15 (×2): 1 mg via INTRAVENOUS
  Filled 2018-10-14 (×2): qty 1

## 2018-10-14 MED ORDER — SODIUM CHLORIDE 0.9% IV SOLUTION
Freq: Once | INTRAVENOUS | Status: AC
  Administered 2018-10-14: 09:00:00 via INTRAVENOUS

## 2018-10-14 MED ORDER — FENTANYL CITRATE (PF) 100 MCG/2ML IJ SOLN
12.5000 ug | INTRAMUSCULAR | Status: DC | PRN
  Administered 2018-10-14: 25 ug via INTRAVENOUS
  Filled 2018-10-14: qty 2

## 2018-10-14 MED ORDER — SODIUM CHLORIDE 0.9% FLUSH
3.0000 mL | Freq: Two times a day (BID) | INTRAVENOUS | Status: DC
  Administered 2018-10-15: 3 mL via INTRAVENOUS

## 2018-10-14 MED ORDER — HALOPERIDOL LACTATE 5 MG/ML IJ SOLN: 1.0000 mg | INTRAMUSCULAR | Status: DC | PRN

## 2018-10-14 MED ORDER — POLYVINYL ALCOHOL 1.4 % OP SOLN: 1.0000 [drp] | Freq: Four times a day (QID) | OPHTHALMIC | Status: DC | PRN

## 2018-10-14 MED ORDER — ONDANSETRON HCL 4 MG/2ML IJ SOLN: 4.0000 mg | Freq: Four times a day (QID) | INTRAMUSCULAR | Status: DC | PRN

## 2018-10-14 MED ORDER — HALOPERIDOL LACTATE 2 MG/ML PO CONC
0.5000 mg | ORAL | Status: DC | PRN
  Filled 2018-10-14: qty 0.3

## 2018-10-14 MED ORDER — HALOPERIDOL 0.5 MG PO TABS
0.5000 mg | ORAL_TABLET | ORAL | Status: DC | PRN
  Filled 2018-10-14: qty 1

## 2018-10-14 NOTE — Progress Notes (Signed)
Daily Progress Note   Patient Name: Sean Murray       Date: 10/14/2018 DOB: Feb 06, 1956  Age: 63 y.o. MRN#: [id number] Attending Physician: Elodia Florence., * Primary Care Physician: [name], MD Admit Date: 10/22/2018  Reason for Consultation/Follow-up: Establishing goals of care  Subjective: Sean Murray is unresponsive today with periods of apnea of > 20 seconds.  Met with family in conjunction with Dr. Florene Glen including Azucena Freed.  We discussed continued decline and the fact that Sean Murray is actively dying.  Family expressed understanding of this and expressed desire to ensure that he is comfortable.  We discussed care plan moving forward, including continuation of medications for comfort (treat pain, anxiety, agitation, continue seizure meds) while also stopping interventions not related to his comfort (abx, labs, etc).  Family is in agreement with this plan.  Length of Stay: 9  Current Medications: Scheduled Meds:  . allopurinol  300 mg Oral q morning - 03E  . folic acid  1 mg Intravenous Daily  . magic mouthwash w/lidocaine  10 mL Oral QID  . mouth rinse  15 mL Mouth Rinse BID  . midodrine  10 mg Oral TID WC  . multivitamin with minerals  1 tablet Oral Daily  . mupirocin ointment   Topical BID  . phenytoin (DILANTIN) IV  100 mg Intravenous Q8H  . polyethylene glycol  17 g Oral BID  . senna  1 tablet Oral BID  . thiamine injection  100 mg Intravenous Daily    Continuous Infusions: . ceFEPime (MAXIPIME) IV 1 g (10/14/18 1219)  . vancomycin 1,000 mg (10/14/18 0822)    PRN Meds: acetaminophen **OR** acetaminophen, bisacodyl, diphenhydrAMINE, fentaNYL (SUBLIMAZE) injection, HYDROcodone-acetaminophen, LORazepam, oxymetazoline, povidone-iodine  Physical Exam           Does not participate in encounter.  Actively dying. Diminished with periods of apnea noted. Abdomen is not distended Has some edema S 1 S 2  Has low BP  Vital Signs: BP (!) 86/50 (BP Location: Right Arm)   Pulse 80   Temp 99 F (37.2 C) (Axillary)   Resp 12   Ht 5' 2"  (1.575 m)   Wt 52 kg   SpO2 98%   BMI 20.97 kg/m  SpO2: SpO2: 98 % O2 Device: O2 Device: Room Air O2 Flow Rate: O2 Flow Rate (  L/min): 2 L/min  Intake/output summary:   Intake/Output Summary (Last 24 hours) at 10/14/2018 1235 Last data filed at 10/14/2018 1057 Gross per 24 hour  Intake 1301.15 ml  Output 1520 ml  Net -218.85 ml   LBM: Last BM Date: 10/10/18 Baseline Weight: Weight: 52 kg Most recent weight: Weight: 52 kg      PPS 10% Palliative Assessment/Data:    Flowsheet Rows     Most Recent Value  Intake Tab  Referral Department  Hospitalist  Unit at Time of Referral  ER  Date Notified  10/06/18  Palliative Care Type  New Palliative care  Reason for referral  Clarify Goals of Care  Date of Admission  10/11/2018  Date first seen by Palliative Care  10/06/18  # of days Palliative referral response time  0 Day(s)  # of days IP prior to Palliative referral  1  Clinical Assessment  Psychosocial & Spiritual Assessment  Palliative Care Outcomes      Patient Active Problem List   Diagnosis Date Noted  . Fever   . Metabolic encephalopathy   . Pressure injury of skin 10/06/2018  . DNR (do not resuscitate)   . Dementia associated with other underlying disease without behavioral disturbance ([place])   . Palliative care encounter   . Acute renal failure (ARF) ([place]) 10/27/2018  . Dehydration 10/27/2018  . QT prolongation 10/06/2018  . Hypernatremia 10/04/2018  . Down's syndrome 04/13/2017  . Memory disorder 04/13/2017  . HOH (hard of hearing) 04/13/2017  . Generalized abdominal pain 12/23/2016  . Abnormal findings-gastrointestinal tract 12/23/2016    Palliative Care Assessment & Plan    Patient Profile:    Assessment:  63 yo gentleman from group home, has MR, admitted with renal failure, generalized weakness, poor PO intake, oral ulcers.  PMT following for symptom management and goals of care discussions.    Recommendations/Plan: - Sean Murray continues to decline.  He appears to me today to be actively dying.  Discussed with family, including HCPOA, and plan moving forward is for full comfort care. - Pain: dilaudid as needed - anxiety: ativan as needed -agitation: haldol as needed -secretions: robinul as needed -seizures: continue dilantin  Code Status:    Code Status Orders  (From admission, onward)         Start     Ordered   10/06/18 1446  Do not attempt resuscitation (DNR)  Continuous    Question Answer Comment  In the event of cardiac or respiratory ARREST Do not call a "code blue"   In the event of cardiac or respiratory ARREST Do not perform Intubation, CPR, defibrillation or ACLS   In the event of cardiac or respiratory ARREST Use medication by any route, position, wound care, and other measures to relive pain and suffering. May use oxygen, suction and manual treatment of airway obstruction as needed for comfort.   Comments Per Guardian Fenton Malling.  DNR but full scope treatment otherwise.      10/06/18 1446        Code Status History    Date Active Date Inactive Code Status Order ID Comments User Context   10/14/2018 2055 10/06/2018 1446 Full Code [id number]  Toy Baker, MD Inpatient       Prognosis:   Hours - Days  Discharge Planning:  Anticipate hospital death  Care plan was discussed with Dr. Florene Glen Thank you for allowing the Palliative Medicine Team to assist in the care of this patient.   Total Time 45 Prolonged Time  Billed  no       Greater than 50%  of this time was spent counseling and coordinating care related to the above assessment and plan.  Micheline Rough, MD  Please contact Palliative Medicine Team phone at  [phone] for questions and concerns.

## 2018-10-14 NOTE — Evaluation (Signed)
SLP Cancellation Note  Patient Details Name: Jakota Manthei MRN: [id number] DOB: [birth year]   Cancelled treatment:       Reason Eval/Treat Not Completed: Other (comment)(received cancellation order - note pt aspirating secretions at this time)   Macario Golds 10/14/2018, 3:31 PM   Luanna Salk, [facility] SLP Acute Rehab Services Pager [phone] Office [phone]

## 2018-10-14 NOTE — Progress Notes (Signed)
Pharmacy Antibiotic Note  Sean Murray is a 63 y.o. male admitted on 10/17/2018 with progressive failure to thrive. Hospital course complicated by DVT, cellulitis, and now multilobar PNA. Pharmacy has been consulted for vancomycin and cefepime dosing.  Today, 10/14/2018:  Vancomycin discontinued  Transitioning to comfort care  D2 cefepime  Afebrile since yesterday, WBC improved  Plan:  Cefepime 1 g IV q12 hr  Pharmacy will sign off, following peripherally for renal adjustments and Cx results   Height: 5\' 2"  (157.5 cm) Weight: 114 lb 10.2 oz (52 kg) IBW/kg (Calculated) : 54.6  Temp (24hrs), Avg:99.2 F (37.3 C), Min:97.6 F (36.4 C), Max:100 F (37.8 C)  Recent Labs  Lab 10/09/18 0854 10/10/18 0308 10/11/18 0236 10/12/18 0445 10/13/18 0755 10/14/18 0455  WBC  --  19.7* 14.8* 10.4 12.8* 10.6*  CREATININE  --  0.90 0.90 0.90 1.01 1.06  LATICACIDVEN 1.4  --   --   --   --   --     Estimated Creatinine Clearance: 51.8 mL/min (by C-G formula based on SCr of 1.06 mg/dL).    Allergies  Allergen Reactions  . Amoxicillin Rash    Unsure wasn't listed on [facility] 01/29/17.  Severe rash Unsure wasn't listed on [facility] 01/29/17.  Severe rash  . Nsaids Other (See Comments)    Avoid NSAIDS--  Strong family hx liver cancer Avoid NSAIDS--  Strong family hx liver cancer      Thank you for allowing pharmacy to be a part of this patient's care.  Reuel Boom, PharmD, BCPS [phone] 10/14/2018, 2:30 PM

## 2018-10-14 NOTE — Evaluation (Addendum)
SLP Cancellation Note  Patient Details Name: Sean Murray MRN: [id number] DOB: 1956/02/01   Cancelled treatment:       Reason Eval/Treat Not Completed: Other (comment)(note palliative following this pt, will follow up)   Macario Golds 10/14/2018, 9:34 AM   Luanna Salk, [place] [facility] SLP Acute Rehab Services Pager [phone] Office [phone]

## 2018-10-14 NOTE — Progress Notes (Signed)
PT Cancellation Note  Patient Details Name: Sean Murray MRN: [id number] DOB: [birth year]   Cancelled Treatment:    Reason Eval/Treat Not Completed: Medical issues which prohibited therapy(Per RN, pt with 30-second periods of apnea and RN defers PT. Pt not medically ready for PT today. PT to check back as schedule allows.  )  Sean Murray, [place] Pager [phone]  Office [phone]   Sean Murray D Elonda Husky 10/14/2018, 11:44 AM

## 2018-10-14 NOTE — Progress Notes (Signed)
PROGRESS NOTE    Sean Murray  ATF:[id number] DOB: 1955/11/23 DOA: 10/28/2018 PCP: [name], MD   Brief Narrative:  63 year old male with Sean Murray past medical history of mental retardation who lives in [facility] group home who is wheelchair-bound at baseline.  Her legal guardian who is apparently his sister who lives in [place].  Patient was brought into the hospital due to decreased oral intake for the past 3 days.  Apparently patient has dementia at baseline and has been progressively getting worse over the past many months.  Assessment & Plan:   Active Problems:   Down's syndrome   Acute renal failure (ARF) (HCC)   Dehydration   QT prolongation   Hypernatremia   Pressure injury of skin   DNR (do not resuscitate)   Dementia associated with other underlying disease without behavioral disturbance ([place])   Palliative care encounter   Metabolic encephalopathy   Fever  Goals of Care: pt declining today with several periods of apnea.  On discussion with pt siblings Sean Murray as well as brother and sister), discussed that primary goal at this point is for comfort.  Transitioning to comfort care. Appreciate assistance of palliative care Prn robinul, haldol, dilaudid, ativan, zofran for comfort Continuing phenytoin Dc abx, stop labs  Multifocal Pneumonia  Recurrent Fever  Concern for aspiration: CXR with multifocal pneumonia Follow blood cx Given goals of care, will d/c abx  Fever  Cellulitis versus Thrombophlebitis of the right upper extremity Patient had an IV access in the right upper extremity which infiltrated.  Doppler shows acute superficial thrombophlebitis.  This seems improved. Pt on ancef from 1/12 - 1/16 -> will d/c in setting of above MRSA PCR negative 1/2 blood cx with coagulase negative staph - suspect this is contaminant, but will plan to repeat cx once course of abx is complete for cellulitis (discussed with ID) (follow repeat blood cx)  Hypotension, etiology  unclear BP fluctuating, apparently pt with hx of chronic hypotension Presented with BP's in 70's and 80's BP's in 80's and 90's today, slightly lower than previous, transitioning to comfort measures as noted above Etiology was not immediately clear at admission.  No infection was present at the time of admission.   Cortisol level was normal (13.6), making adrenal insufficiency unlikely.  Lactic acid level was normal.   Echo with EF 40-45% with hypokinesis of anterior wall, anteroseptum and anterolateral walls - difficult to tell if related to LBBB or regional wall motion abnormality (per report, see below)   Chronic systolic CHF Diminished EF noted on echocardiogram.  No evidence for acute exacerbation.   Report as noted below Due to his other comorbidities and poor baseline functional status he is not Sean Murray candidate for further cardiac work-up or treatment.  Acute pulmonary embolism and lower extremity DVT Patient was found to have acute DVT in the lower extremity.   CT PE protocol with right lower lobe PE without evidence of right heart strain Heparin d/c'd given low hb and comfort measures now as noted above  Macrocytic anemia hemoglobin has been downtrending since 1/13 (but it was ~7.8 on 1/12).   S/p 2 units pRBC No obvious signs bleeding FOBT Labs suggestive of AOCD with iron deficiency.  Normal folate/b12.  No signs of bleeding and epistaxis seems resolved.    Delirium: likely in setting of pneumonia and acute hospitalization.   Delirium precautions Comfort measures as noted above  Acute epistaxis Possibly related to heparin.  Was placed on Afrin nasal spray.  Resolved.  Urinary Retention: Foley placed, will continue to monitor Renal function appears stable  Acute renal failure with hypernatremia and hypokalemia and hypomagnesemia This was most likely due to poor oral intake over the past many days prior to admission.  Renal function is now normal.  Sodium and potassium  levels have improved.  Magnesium is normal.     Mild rhabdomyolysis CK level was noted to be elevated.  Improved with hydration.  Levels have improved.  QT prolongation Avoid QT prolonging medications.  Monitor on telemetry.  Mildly abnormal LFTs Most likely in the setting of rhabdomyolysis.  Hepatitis panel is unremarkable.  Stable.  Patient with known history of cholelithiasis.  However his right upper quadrant is benign on examination.  Do not anticipate any further testing at this time. CTM  History of seizure disorder EEG was done which revealed epileptiform activity.  Unclear if this was clinically significant.  However patient's Dilantin level was subtherapeutic at admission.  After discussions with neurology Sean Murray loading dose of Dilantin was given.  No repeat EEG per neurology.  Dilantin level was therapeutic on 1/12 when adjusted for low albumin.  Continue current dose of phenytoin.  Change to oral when he is able to take oral medications consistently. May need to consider discussing with neurology again if not improving (neurology note from 1/10), but for now, continue to monitor Continue dilantin  Acute metabolic encephalopathy Patient with known history of mental retardation.  See above under seizure disorder as well.  Persistent delirium as noted above.  Hypoalbuminemia/severe protein calorie malnutrition Oral intake has been poor.  Now improving.  History of mental retardation Patient has poor quality of life at baseline.  Apparently has been declining over the past several months.  Has had falls.  He is now wheelchair-bound.  Palliative medicine is following.  Discussions held with the patient's legal guardian.  He is now DNR.  For many days patient did not show any improvement but in the last 24 hours he appears to have perked up more.  His blood pressures have improved.  Palliative medicine continues to follow.   DVT prophylaxis: heparin, on hold Code Status:  DNR Family Communication: Sister, Sean Murray and siblings Disposition Plan: pending improvement   Consultants:   neurology  Procedures:  Echo Study Conclusions  - Left ventricle: Systolic function was mildly to moderately   reduced. The estimated ejection fraction was in the range of 40%   to 45%. Cannot exclude hypokinesis of the anteroseptal, anterior,   and anterolateral myocardium. - Ventricular septum: Septal motion showed dyssynergy. These   changes are consistent with Atha Mcbain left bundle branch block. - Impressions: Incomplete sutdy as patient terminated study prior   to exam completion. On available images EF appears to be in the   40-45% range with hypokinesis of the anterior wall, aanteroseptum   and anterolateral walls. As there are no 2-chamber images hard to   tell if this is all due to LBBB or if there is also Tremel Setters regional   wall motion abnormlaity due to previous infarct.  Impressions:  - Incomplete sutdy as patient terminated study prior to exam   completion. On available images EF appears to be in the 40-45%   range with hypokinesis of the anterior wall, aanteroseptum and   anterolateral walls. As there are no 2-chamber images hard to   tell if this is all due to LBBB or if there is also Trell Secrist regional   wall motion abnormlaity due to previous infarct.   Antimicrobials:  Anti-infectives (From admission, onward)   Start     Dose/Rate Route Frequency Ordered Stop   10/14/18 0800  vancomycin (VANCOCIN) IVPB 1000 mg/200 mL premix  Status:  Discontinued     1,000 mg 200 mL/hr over 60 Minutes Intravenous Every 24 hours 10/13/18 1410 10/14/18 1345   10/13/18 2200  ceFEPIme (MAXIPIME) 1 g in sodium chloride 0.9 % 100 mL IVPB  Status:  Discontinued     1 g 200 mL/hr over 30 Minutes Intravenous Every 12 hours 10/13/18 1410 10/14/18 1457   10/13/18 1315  vancomycin (VANCOCIN) IVPB 1000 mg/200 mL premix     1,000 mg 200 mL/hr over 60 Minutes Intravenous  Once 10/13/18 1300  10/13/18 1555   10/13/18 1300  ceFEPIme (MAXIPIME) 2 g in sodium chloride 0.9 % 100 mL IVPB     2 g 200 mL/hr over 30 Minutes Intravenous  Once 10/13/18 1300 10/13/18 1516   10/10/18 2200  ceFAZolin (ANCEF) IVPB 2g/100 mL premix  Status:  Discontinued     2 g 200 mL/hr over 30 Minutes Intravenous Every 8 hours 10/10/18 1510 10/13/18 1252   10/09/18 1400  ceFAZolin (ANCEF) IVPB 1 g/50 mL premix  Status:  Discontinued     1 g 100 mL/hr over 30 Minutes Intravenous Every 8 hours 10/09/18 1256 10/10/18 1510         Subjective: Restless this morning. On evaluation later this afternoon, seemed more somnolent. Family, friends at bedside. Initially discussed with sister, plan for CT of abdomen, but on reevaluation, pt was having periods of apnea, declining.  Discussed again with sister and decided to d/c plan for CT, plan for conversation in PM regarding transition to comfort measures.  Objective: Vitals:   10/14/18 0900 10/14/18 0915 10/14/18 1055 10/14/18 1411  BP: (!) 82/47 (!) 90/47 (!) 86/50 (!) 98/53  Pulse: 90 89 80 83  Resp: 12   20  Temp: 98.9 F (37.2 C) 99.1 F (37.3 C) 99 F (37.2 C) 99.2 F (37.3 C)  TempSrc: Axillary Axillary Axillary Oral  SpO2: 95%  98% 95%  Weight:      Height:        Intake/Output Summary (Last 24 hours) at 10/14/2018 1907 Last data filed at 10/14/2018 1832 Gross per 24 hour  Intake 1483.7 ml  Output 2450 ml  Net -966.3 ml   Filed Weights   10/06/18 0700  Weight: 52 kg    Examination:  General: agitated in morning Cardiovascular: Heart sounds show Ellianah Cordy regular rate, and rhythm. Lungs: Clear to auscultation bilaterally  Abdomen: Soft, mild abdominal ttp, nondistended  Neurological: confused. Moves all extremities 4.  Skin: Warm and dry. No rashes or lesions. Extremities: No clubbing or cyanosis. No edema.      Data Reviewed: I have personally reviewed following labs and imaging studies  CBC: Recent Labs  Lab 10/10/18 0308  10/11/18 0236 10/12/18 0445 10/12/18 1538 10/12/18 1836 10/13/18 0755 10/14/18 0455  WBC 19.7* 14.8* 10.4  --   --  12.8* 10.6*  HGB 9.0* 8.3* 7.9* 7.1* 7.7* 6.4* 6.6*  HCT 29.3* 27.4* 25.4* 23.4* 25.4* 20.8* 21.1*  MCV 107.7* 106.6* 105.4*  --   --  107.2* 100.5*  PLT 238 280 280  --   --  269 893   Basic Metabolic Panel: Recent Labs  Lab 10/09/18 0310 10/10/18 0308 10/11/18 0236 10/12/18 0445 10/13/18 0755 10/14/18 0455  NA 142 141 138 136 136 133*  K 3.2* 3.9 3.8 3.9 4.0 4.0  CL 116*  113* 112* 104 103 103  CO2 21* 21* 20* 23 23 22   GLUCOSE 118* 85 96 107* 109* 102*  BUN 11 11 9 8 11 13   CREATININE 0.92 0.90 0.90 0.90 1.01 1.06  CALCIUM 6.6* 6.9* 6.9* 7.1* 6.8* 6.8*  MG 1.5* 2.0  --   --  1.8 1.8   GFR: Estimated Creatinine Clearance: 51.8 mL/min (by C-G formula based on SCr of 1.06 mg/dL). Liver Function Tests: Recent Labs  Lab 10/08/18 0011 10/09/18 0310 10/13/18 0755 10/14/18 0455  AST 79* 50* 48* 47*  ALT 46* 37 17 14  ALKPHOS 103 108 145* 113  BILITOT 0.4 0.3 0.3 0.7  PROT 4.6* 4.1* 4.8* 4.5*  ALBUMIN 1.6* 1.3* 1.4* 1.4*   No results for input(s): LIPASE, AMYLASE in the last 168 hours. No results for input(s): AMMONIA in the last 168 hours. Coagulation Profile: No results for input(s): INR, PROTIME in the last 168 hours. Cardiac Enzymes: Recent Labs  Lab 10/08/18 0011  CKTOTAL 528*   BNP (last 3 results) No results for input(s): PROBNP in the last 8760 hours. HbA1C: No results for input(s): HGBA1C in the last 72 hours. CBG: No results for input(s): GLUCAP in the last 168 hours. Lipid Profile: No results for input(s): CHOL, HDL, LDLCALC, TRIG, CHOLHDL, LDLDIRECT in the last 72 hours. Thyroid Function Tests: No results for input(s): TSH, T4TOTAL, FREET4, T3FREE, THYROIDAB in the last 72 hours. Anemia Panel: No results for input(s): VITAMINB12, FOLATE, FERRITIN, TIBC, IRON, RETICCTPCT in the last 72 hours. Sepsis Labs: Recent Labs  Lab  10/09/18 0854  LATICACIDVEN 1.4    Recent Results (from the past 240 hour(s))  Urine culture     Status: None   Collection Time: 10/10/2018  3:32 PM  Result Value Ref Range Status   Specimen Description   Final    URINE, CATHETERIZED Performed at [facility] [address] [place], [place] [address]    Special Requests   Final    NONE Performed at [facility], [place] [address] [place], [place] [address]    Culture   Final    NO GROWTH Performed at [facility], [place] [address] [place], [place] [address]    Report Status 10/07/2018 FINAL  Final  Blood Culture (routine x 2)     Status: None   Collection Time: 10/09/2018  4:50 PM  Result Value Ref Range Status   Specimen Description   Final    BLOOD RIGHT ARM Performed at [facility] [address], [place], [place] [address]    Special Requests   Final    BOTTLES DRAWN AEROBIC AND ANAEROBIC Blood Culture adequate volume   Culture   Final    NO GROWTH 5 DAYS Performed at [facility], [place] [address] [place], [place] [address]    Report Status 10/10/2018 FINAL  Final  Blood Culture (routine x 2)     Status: None   Collection Time: 10/16/2018  5:06 PM  Result Value Ref Range Status   Specimen Description BLOOD BLOOD LEFT FOREARM  Final   Special Requests   Final    BOTTLES DRAWN AEROBIC AND ANAEROBIC Blood Culture adequate volume Performed at [facility] [address] [place], [place] [address]    Culture   Final    NO GROWTH 5 DAYS Performed at [facility], [place] [address], [place], [place] [address]    Report Status 10/10/2018 FINAL  Final  MRSA PCR Screening     Status: None   Collection Time: 10/17/2018  8:57 PM  Result Value Ref Range Status   MRSA by PCR NEGATIVE NEGATIVE Final    Comment:        The GeneXpert MRSA Assay (FDA approved for NASAL specimens only), is one component of Leili Eskenazi comprehensive MRSA  colonization surveillance program. It is not intended to diagnose MRSA infection nor to guide or monitor treatment for MRSA infections. Performed at [facility], [place] [address] [place], [place] [address]   Culture, blood (Routine X 2) w Reflex to ID Panel     Status: None   Collection Time: 10/09/18  8:54 AM  Result Value Ref Range Status   Specimen Description   Final    BLOOD RIGHT HAND Performed at [facility] [address], [place], [place] [address]    Special Requests   Final    BOTTLES DRAWN AEROBIC ONLY Blood Culture adequate volume Performed at [facility] [address], [place], [place] [address]    Culture   Final    NO GROWTH 5 DAYS Performed at [facility], [place] [address], [place], [place] [address]    Report Status 10/14/2018 FINAL  Final  Culture, blood (Routine X 2) w Reflex to ID Panel     Status: Abnormal   Collection Time: 10/09/18  8:54 AM  Result Value Ref Range Status   Specimen Description BLOOD RIGHT ARM  Final   Special Requests   Final    BOTTLES DRAWN AEROBIC ONLY Blood Culture results may not be optimal due to an inadequate volume of blood received in culture bottles Performed at [facility], [place] [address], [place], [place] [address]    Culture  Setup Time   Final    GRAM POSITIVE COCCI AEROBIC BOTTLE ONLY Organism ID to follow CRITICAL RESULT CALLED TO, READ BACK BY AND VERIFIED WITH: Shelda Jakes PharmD 14:55 10/10/18 ([email or web address])    Culture (Cire Deyarmin)  Final    STAPHYLOCOCCUS SPECIES (COAGULASE NEGATIVE) THE SIGNIFICANCE OF ISOLATING THIS ORGANISM FROM Neeya Prigmore SINGLE SET OF BLOOD CULTURES WHEN MULTIPLE SETS ARE DRAWN IS UNCERTAIN. PLEASE NOTIFY THE MICROBIOLOGY DEPARTMENT WITHIN ONE WEEK IF SPECIATION AND SENSITIVITIES ARE REQUIRED. Performed at [facility], [place] [address], [place], [place] [address]    Report Status 10/12/2018 FINAL  Final  Blood Culture ID Panel  (Reflexed)     Status: Abnormal   Collection Time: 10/09/18  8:54 AM  Result Value Ref Range Status   Enterococcus species NOT DETECTED NOT DETECTED Final   Listeria monocytogenes NOT DETECTED NOT DETECTED Final   Staphylococcus species DETECTED (Melinna Linarez) NOT DETECTED Final    Comment: Methicillin (oxacillin) susceptible coagulase negative staphylococcus. Possible blood culture contaminant (unless isolated from more than one blood culture draw or clinical case suggests pathogenicity). No antibiotic treatment is indicated for blood  culture contaminants. CRITICAL RESULT CALLED TO, READ BACK BY AND VERIFIED WITH: Shelda Jakes PharmD 14:55 10/10/18 ([email or web address])    Staphylococcus aureus (BCID) NOT DETECTED NOT DETECTED Final   Methicillin resistance NOT DETECTED NOT DETECTED Final   Streptococcus species NOT DETECTED NOT DETECTED Final   Streptococcus agalactiae NOT DETECTED NOT DETECTED Final   Streptococcus pneumoniae NOT DETECTED NOT DETECTED Final   Streptococcus pyogenes NOT DETECTED NOT DETECTED Final   Acinetobacter baumannii NOT DETECTED NOT DETECTED Final   Enterobacteriaceae species NOT DETECTED NOT DETECTED Final   Enterobacter  cloacae complex NOT DETECTED NOT DETECTED Final   Escherichia coli NOT DETECTED NOT DETECTED Final   Klebsiella oxytoca NOT DETECTED NOT DETECTED Final   Klebsiella pneumoniae NOT DETECTED NOT DETECTED Final   Proteus species NOT DETECTED NOT DETECTED Final   Serratia marcescens NOT DETECTED NOT DETECTED Final   Haemophilus influenzae NOT DETECTED NOT DETECTED Final   Neisseria meningitidis NOT DETECTED NOT DETECTED Final   Pseudomonas aeruginosa NOT DETECTED NOT DETECTED Final   Candida albicans NOT DETECTED NOT DETECTED Final   Candida glabrata NOT DETECTED NOT DETECTED Final   Candida krusei NOT DETECTED NOT DETECTED Final   Candida parapsilosis NOT DETECTED NOT DETECTED Final   Candida tropicalis NOT DETECTED NOT DETECTED Final    Comment: Performed at [facility], [place] [address], [place], [place] [address]  Culture, Urine     Status: None   Collection Time: 10/13/18  9:52 AM  Result Value Ref Range Status   Specimen Description   Final    URINE, RANDOM Performed at [facility] [address] [place], [place] [address]    Special Requests   Final    NONE Performed at [facility], [place] [address], [place], [place] [address]    Culture   Final    NO GROWTH Performed at [facility], [place] [address], [place], [place] [address]    Report Status 10/14/2018 FINAL  Final  Culture, blood (routine x 2)     Status: None (Preliminary result)   Collection Time: 10/13/18 10:25 AM  Result Value Ref Range Status   Specimen Description   Final    BLOOD RIGHT ARM Performed at [facility] [address], [place], [place] [address]    Special Requests   Final    BOTTLES DRAWN AEROBIC AND ANAEROBIC Blood Culture adequate volume Performed at [facility] [address] [place], [place] [address]    Culture   Final    NO GROWTH < 24 HOURS Performed at [facility] [address], [place], [place] [address]    Report Status PENDING  Incomplete  Culture, blood (routine x 2)     Status: None (Preliminary result)   Collection Time: 10/13/18 10:30 AM  Result Value Ref Range Status   Specimen Description   Final    BLOOD RIGHT HAND Performed at [facility] [address], [place], [place] [address]    Special Requests   Final    BOTTLES DRAWN AEROBIC ONLY Blood Culture adequate volume Performed at [facility] [address], [place], [place] [address]    Culture   Final    NO GROWTH < 24 HOURS Performed at [facility] [address], [place], [place] [address]    Report Status PENDING  Incomplete  MRSA PCR Screening     Status: None   Collection Time: 10/13/18  3:15 PM  Result Value Ref Range  Status   MRSA by PCR NEGATIVE NEGATIVE Final    Comment:        The GeneXpert MRSA Assay (FDA approved for NASAL specimens only), is one component of Adysson Revelle comprehensive MRSA colonization surveillance program. It is not intended to diagnose MRSA infection nor to guide or monitor treatment for MRSA infections. Performed at [facility], [place] [address] [place],  [address]          Radiology Studies: Dg Abd 1  View  Result Date: 10/13/2018 CLINICAL DATA:  Abdominal pain EXAM: ABDOMEN - 1 VIEW COMPARISON:  10/01/2018 FINDINGS: Scattered large and small bowel gas is noted. Gallstone is again seen. No findings of constipation or obstruction are noted. Degenerative change of the lumbar spine is again seen. IMPRESSION: Chronic changes without acute abnormality. Electronically Signed   By: Inez Catalina M.D.   On: 10/13/2018 10:32   Dg Chest Port 1 View  Result Date: 10/13/2018 CLINICAL DATA:  Fever EXAM: PORTABLE CHEST 1 VIEW COMPARISON:  10/17/2018 FINDINGS: Heart is normal size. There are bilateral perihilar and lower lobe airspace opacities with small effusions. No acute bony abnormality. IMPRESSION: Bilateral mid and lower lung airspace opacities with small effusions. Given the normal heart size, findings are suspicious for multifocal pneumonia. Electronically Signed   By: Rolm Baptise M.D.   On: 10/13/2018 10:30        Scheduled Meds: . magic mouthwash w/lidocaine  10 mL Oral QID  . mouth rinse  15 mL Mouth Rinse BID  . phenytoin (DILANTIN) IV  100 mg Intravenous Q8H  . sodium chloride flush  3 mL Intravenous Q12H   Continuous Infusions: . sodium chloride       LOS: 9 days    Time spent: over 30 min    Fayrene Helper, MD [facility] Pager see AMION for pager  If 7PM-7AM, please contact night-coverage [email or web address] Password [facility] 10/14/2018, 7:07 PM

## 2018-10-15 DIAGNOSIS — R109 Unspecified abdominal pain: Secondary | ICD-10-CM

## 2018-10-15 DIAGNOSIS — N179 Acute kidney failure, unspecified: Principal | ICD-10-CM

## 2018-10-15 DIAGNOSIS — J189 Pneumonia, unspecified organism: Secondary | ICD-10-CM

## 2018-10-15 NOTE — Progress Notes (Signed)
PROGRESS NOTE    Sean Murray  ATF:[id number] DOB: July 05, 1956 DOA: 10/21/2018 PCP: [name], MD   Brief Narrative:  63 year old male with Sean Murray past medical history of mental retardation who lives in [name] group [facility] who is wheelchair-bound at baseline.  Her legal guardian who is apparently his sister who lives in [place].  Patient was brought into the hospital due to decreased oral intake for the past 3 days.  Apparently patient has dementia at baseline and has been progressively getting worse over the past many months.  Assessment & Plan:   Active Problems:   Down's syndrome   Acute renal failure (ARF) (HCC)   Dehydration   QT prolongation   Hypernatremia   Pressure injury of skin   DNR (do not resuscitate)   Dementia associated with other underlying disease without behavioral disturbance [facility])   Palliative care encounter   Metabolic encephalopathy   Fever   Multifocal pneumonia  Goals of Care: Maintaining comfort measures as [name] continues to decline. Appreciate assistance of palliative care Prn robinul, haldol, dilaudid, ativan, zofran for comfort Continuing phenytoin Dc abx, stop labs  Multifocal Pneumonia  Recurrent Fever  Concern for aspiration: CXR with multifocal pneumonia Follow blood cx Given goals of care, will d/c abx  Fever  Cellulitis versus Thrombophlebitis of the right upper extremity Patient had an IV access in the right upper extremity which infiltrated.  Doppler shows acute superficial thrombophlebitis.  This seems improved. Pt on ancef from 1/12 - 1/16 -> will d/c in setting of above MRSA PCR negative 1/2 blood cx with coagulase negative staph - suspect this is contaminant, but will plan to repeat cx once course of abx is complete for cellulitis (discussed with ID) (follow repeat blood cx)  Hypotension, etiology unclear BP fluctuating, apparently pt with hx of chronic hypotension Presented with BP's in 70's and 80's BP's in 80's and  90's today, slightly lower than previous, transitioning to comfort measures as noted above Etiology was not immediately clear at admission.  No infection was present at the time of admission.   Cortisol level was normal (13.6), making adrenal insufficiency unlikely.  Lactic acid level was normal.   Echo with EF 40-45% with hypokinesis of anterior wall, anteroseptum and anterolateral walls - difficult to tell if related to LBBB or regional wall motion abnormality (per report, see below)   Chronic systolic CHF Diminished EF noted on echocardiogram.  No evidence for acute exacerbation.   Report as noted below Due to his other comorbidities and poor baseline functional status he is not [name] candidate for further cardiac work-up or treatment.  Acute pulmonary embolism and lower extremity DVT Patient was found to have acute DVT in the lower extremity.   CT PE protocol with right lower lobe PE without evidence of right heart strain Heparin d/c'd given low hb and comfort measures now as noted above  Macrocytic anemia hemoglobin has been downtrending since 1/13 (but it was ~7.8 on 1/12).   S/p 2 units pRBC No obvious signs bleeding FOBT Labs suggestive of AOCD with iron deficiency.  Normal folate/b12.  No signs of bleeding and epistaxis seems resolved.    Delirium: likely in setting of pneumonia and acute hospitalization.   Delirium precautions Comfort measures as noted above  Acute epistaxis Possibly related to heparin.  Was placed on Afrin nasal spray.  Resolved.   Urinary Retention: Foley placed, will continue to monitor Renal function appears stable  Acute renal failure with hypernatremia and hypokalemia and hypomagnesemia  This was most likely due to poor oral intake over the past many days prior to admission.  Renal function is now normal.  Sodium and potassium levels have improved.  Magnesium is normal.     Mild rhabdomyolysis CK level was noted to be elevated.  Improved with  hydration.  Levels have improved.  QT prolongation Avoid QT prolonging medications.  Monitor on telemetry.  Mildly abnormal LFTs Most likely in the setting of rhabdomyolysis.  Hepatitis panel is unremarkable.  Stable.  Patient with known history of cholelithiasis.  However his right upper quadrant is benign on examination.  Do not anticipate any further testing at this time. CTM  History of seizure disorder EEG was done which revealed epileptiform activity.  Unclear if this was clinically significant.  However patient's Dilantin level was subtherapeutic at admission.  After discussions with neurology Delva Derden loading dose of Dilantin was given.  No repeat EEG per neurology.  Dilantin level was therapeutic on 1/12 when adjusted for low albumin.  Continue current dose of phenytoin.  Change to oral when he is able to take oral medications consistently. May need to consider discussing with neurology again if not improving (neurology note from 1/10), but for now, continue to monitor Continue dilantin  Acute metabolic encephalopathy Patient with known history of mental retardation.  See above under seizure disorder as well.  Persistent delirium as noted above.  Hypoalbuminemia/severe protein calorie malnutrition Oral intake has been poor.  Now improving.  History of mental retardation Patient has poor quality of life at baseline.  Apparently has been declining over the past several months.  Has had falls.  He is now wheelchair-bound.  Palliative medicine is following.  Discussions held with the patient's legal guardian.  He is now DNR.  For many days patient did not show any improvement but in the last 24 hours he appears to have perked up more.  His blood pressures have improved.  Palliative medicine continues to follow.   DVT prophylaxis: heparin, on hold Code Status: DNR Family Communication: Sister, Sean Murray and siblings Disposition Plan: pending improvement   Consultants:    neurology  Procedures:  Echo Study Conclusions  - Left ventricle: Systolic function was mildly to moderately   reduced. The estimated ejection fraction was in the range of 40%   to 45%. Cannot exclude hypokinesis of the anteroseptal, anterior,   and anterolateral myocardium. - Ventricular septum: Septal motion showed dyssynergy. These   changes are consistent with Shylo Dillenbeck left bundle branch block. - Impressions: Incomplete sutdy as patient terminated study prior   to exam completion. On available images EF appears to be in the   40-45% range with hypokinesis of the anterior wall, aanteroseptum   and anterolateral walls. As there are no 2-chamber images hard to   tell if this is all due to LBBB or if there is also Jaecob Lowden regional   wall motion abnormlaity due to previous infarct.  Impressions:  - Incomplete sutdy as patient terminated study prior to exam   completion. On available images EF appears to be in the 40-45%   range with hypokinesis of the anterior wall, aanteroseptum and   anterolateral walls. As there are no 2-chamber images hard to   tell if this is all due to LBBB or if there is also Arsal Tappan regional   wall motion abnormlaity due to previous infarct.   Antimicrobials:  Anti-infectives (From admission, onward)   Start     Dose/Rate Route Frequency Ordered Stop   10/14/18 0800  vancomycin (VANCOCIN) IVPB 1000 mg/200 mL premix  Status:  Discontinued     1,000 mg 200 mL/hr over 60 Minutes Intravenous Every 24 hours 10/13/18 1410 10/14/18 1345   10/13/18 2200  ceFEPIme (MAXIPIME) 1 g in sodium chloride 0.9 % 100 mL IVPB  Status:  Discontinued     1 g 200 mL/hr over 30 Minutes Intravenous Every 12 hours 10/13/18 1410 10/14/18 1457   10/13/18 1315  vancomycin (VANCOCIN) IVPB 1000 mg/200 mL premix     1,000 mg 200 mL/hr over 60 Minutes Intravenous  Once 10/13/18 1300 10/13/18 1555   10/13/18 1300  ceFEPIme (MAXIPIME) 2 g in sodium chloride 0.9 % 100 mL IVPB     2 g 200 mL/hr  over 30 Minutes Intravenous  Once 10/13/18 1300 10/13/18 1516   10/10/18 2200  ceFAZolin (ANCEF) IVPB 2g/100 mL premix  Status:  Discontinued     2 g 200 mL/hr over 30 Minutes Intravenous Every 8 hours 10/10/18 1510 10/13/18 1252   10/09/18 1400  ceFAZolin (ANCEF) IVPB 1 g/50 mL premix  Status:  Discontinued     1 g 100 mL/hr over 30 Minutes Intravenous Every 8 hours 10/09/18 1256 10/10/18 1510         Subjective: Unresponsive Family at bedside  Objective: Vitals:   10/14/18 0915 10/14/18 1055 10/14/18 1411 10/15/18 0946  BP: (!) 90/47 (!) 86/50 (!) 98/53   Pulse: 89 80 83   Resp:   20   Temp: 99.1 F (37.3 C) 99 F (37.2 C) 99.2 F (37.3 C) (!) 102.5 F (39.2 C)  TempSrc: Axillary Axillary Oral Axillary  SpO2:  98% 95%   Weight:      Height:        Intake/Output Summary (Last 24 hours) at 10/15/2018 1910 Last data filed at 10/15/2018 1845 Gross per 24 hour  Intake 120 ml  Output 350 ml  Net -230 ml   Filed Weights   10/06/18 0700  Weight: 52 kg    Examination:  General: unresponsive Cardiovascular: Heart sounds show Makailey Hodgkin regular rate, and rhythm.  Lungs: gasping respirations Abdomen: Soft, nontender, nondistended  Neurological: unresponsive Skin: Warm and dry. No rashes or lesions. Extremities: No clubbing or cyanosis.     Data Reviewed: I have personally reviewed following labs and imaging studies  CBC: Recent Labs  Lab 10/10/18 0308 10/11/18 0236 10/12/18 0445 10/12/18 1538 10/12/18 1836 10/13/18 0755 10/14/18 0455  WBC 19.7* 14.8* 10.4  --   --  12.8* 10.6*  HGB 9.0* 8.3* 7.9* 7.1* 7.7* 6.4* 6.6*  HCT 29.3* 27.4* 25.4* 23.4* 25.4* 20.8* 21.1*  MCV 107.7* 106.6* 105.4*  --   --  107.2* 100.5*  PLT 238 280 280  --   --  269 366   Basic Metabolic Panel: Recent Labs  Lab 10/09/18 0310 10/10/18 0308 10/11/18 0236 10/12/18 0445 10/13/18 0755 10/14/18 0455  NA 142 141 138 136 136 133*  K 3.2* 3.9 3.8 3.9 4.0 4.0  CL 116* 113* 112* 104 103  103  CO2 21* 21* 20* 23 23 22   GLUCOSE 118* 85 96 107* 109* 102*  BUN 11 11 9 8 11 13   CREATININE 0.92 0.90 0.90 0.90 1.01 1.06  CALCIUM 6.6* 6.9* 6.9* 7.1* 6.8* 6.8*  MG 1.5* 2.0  --   --  1.8 1.8   GFR: Estimated Creatinine Clearance: 51.8 mL/min (by C-G formula based on SCr of 1.06 mg/dL). Liver Function Tests: Recent Labs  Lab 10/09/18 0310 10/13/18 0755 10/14/18 0455  AST 50* 48* 47*  ALT 37 17 14  ALKPHOS 108 145* 113  BILITOT 0.3 0.3 0.7  PROT 4.1* 4.8* 4.5*  ALBUMIN 1.3* 1.4* 1.4*   No results for input(s): LIPASE, AMYLASE in the last 168 hours. No results for input(s): AMMONIA in the last 168 hours. Coagulation Profile: No results for input(s): INR, PROTIME in the last 168 hours. Cardiac Enzymes: No results for input(s): CKTOTAL, CKMB, CKMBINDEX, TROPONINI in the last 168 hours. BNP (last 3 results) No results for input(s): PROBNP in the last 8760 hours. HbA1C: No results for input(s): HGBA1C in the last 72 hours. CBG: No results for input(s): GLUCAP in the last 168 hours. Lipid Profile: No results for input(s): CHOL, HDL, LDLCALC, TRIG, CHOLHDL, LDLDIRECT in the last 72 hours. Thyroid Function Tests: No results for input(s): TSH, T4TOTAL, FREET4, T3FREE, THYROIDAB in the last 72 hours. Anemia Panel: No results for input(s): VITAMINB12, FOLATE, FERRITIN, TIBC, IRON, RETICCTPCT in the last 72 hours. Sepsis Labs: Recent Labs  Lab 10/09/18 0854  LATICACIDVEN 1.4    Recent Results (from the past 240 hour(s))  MRSA PCR Screening     Status: None   Collection Time: 10/06/2018  8:57 PM  Result Value Ref Range Status   MRSA by PCR NEGATIVE NEGATIVE Final    Comment:        The GeneXpert MRSA Assay (FDA approved for NASAL specimens only), is one component of Alquan Morrish comprehensive MRSA colonization surveillance program. It is not intended to diagnose MRSA infection nor to guide or monitor treatment for MRSA infections. Performed at [facility],  [place] [address], [place], [place] [address]   Culture, blood (Routine X 2) w Reflex to ID Panel     Status: None   Collection Time: 10/09/18  8:54 AM  Result Value Ref Range Status   Specimen Description   Final    BLOOD RIGHT HAND Performed at [facility] [address], [place], [place] [address]    Special Requests   Final    BOTTLES DRAWN AEROBIC ONLY Blood Culture adequate volume Performed at [facility] [address] [place], [place] [address]    Culture   Final    NO GROWTH 5 DAYS Performed at [facility], [place] [address], [place], [place] [address]    Report Status 10/14/2018 FINAL  Final  Culture, blood (Routine X 2) w Reflex to ID Panel     Status: Abnormal   Collection Time: 10/09/18  8:54 AM  Result Value Ref Range Status   Specimen Description BLOOD RIGHT ARM  Final   Special Requests   Final    BOTTLES DRAWN AEROBIC ONLY Blood Culture results may not be optimal due to an inadequate volume of blood received in culture bottles Performed at [facility], [place] [address], [place], [place] [address]    Culture  Setup Time   Final    GRAM POSITIVE COCCI AEROBIC BOTTLE ONLY Organism ID to follow CRITICAL RESULT CALLED TO, READ BACK BY AND VERIFIED WITH: Shelda Jakes PharmD 14:55 10/10/18 ([email or web address])    Culture (Said Rueb)  Final    STAPHYLOCOCCUS SPECIES (COAGULASE NEGATIVE) THE SIGNIFICANCE OF ISOLATING THIS ORGANISM FROM Eirik Schueler SINGLE SET OF BLOOD CULTURES WHEN MULTIPLE SETS ARE DRAWN IS UNCERTAIN. PLEASE NOTIFY THE MICROBIOLOGY DEPARTMENT WITHIN ONE WEEK IF SPECIATION AND SENSITIVITIES ARE REQUIRED. Performed at [facility], [place] [address], [place],  [address]    Report Status 10/12/2018 FINAL  Final  Blood Culture ID Panel (Reflexed)     Status: Abnormal   Collection Time: 10/09/18  8:54 AM  Result Value Ref Range Status   Enterococcus species NOT DETECTED NOT DETECTED Final   Listeria monocytogenes  NOT DETECTED NOT DETECTED Final   Staphylococcus species DETECTED (Ramzi Brathwaite) NOT DETECTED Final    Comment: Methicillin (oxacillin) susceptible coagulase negative staphylococcus. Possible blood culture contaminant (unless isolated from more than one blood culture draw or clinical case suggests pathogenicity). No antibiotic treatment is indicated for blood  culture contaminants. CRITICAL RESULT CALLED TO, READ BACK BY AND VERIFIED WITH: Shelda Jakes PharmD 14:55 10/10/18 ([email or web address])    Staphylococcus aureus (BCID) NOT DETECTED NOT DETECTED Final   Methicillin resistance NOT DETECTED NOT DETECTED Final   Streptococcus species NOT DETECTED NOT DETECTED Final   Streptococcus agalactiae NOT DETECTED NOT DETECTED Final   Streptococcus pneumoniae NOT DETECTED NOT DETECTED Final   Streptococcus pyogenes NOT DETECTED NOT DETECTED Final   Acinetobacter baumannii NOT DETECTED NOT DETECTED Final   Enterobacteriaceae species NOT DETECTED NOT DETECTED Final   Enterobacter cloacae complex NOT DETECTED NOT DETECTED Final   Escherichia coli NOT DETECTED NOT DETECTED Final   Klebsiella oxytoca NOT DETECTED NOT DETECTED Final   Klebsiella pneumoniae NOT DETECTED NOT DETECTED Final   Proteus species NOT DETECTED NOT DETECTED Final   Serratia marcescens NOT DETECTED NOT DETECTED Final   Haemophilus influenzae NOT DETECTED NOT DETECTED Final   Neisseria meningitidis NOT DETECTED NOT DETECTED Final   Pseudomonas aeruginosa NOT DETECTED NOT DETECTED Final   Candida albicans NOT DETECTED NOT DETECTED Final   Candida glabrata NOT DETECTED NOT DETECTED Final   Candida krusei NOT DETECTED NOT DETECTED Final   Candida parapsilosis NOT DETECTED NOT DETECTED Final   Candida tropicalis NOT DETECTED NOT DETECTED Final    Comment: Performed at [facility], [address] [place], [place] [address]  Culture, Urine     Status: None   Collection Time: 10/13/18  9:52 AM  Result Value Ref Range Status   Specimen Description    Final    URINE, RANDOM Performed at [facility] [address], [place], [place] [address]    Special Requests   Final    NONE Performed at [facility], [place] [address] [place], [place] [address]    Culture   Final    NO GROWTH Performed at [facility], [place] [address], [place], [place] [address]    Report Status 10/14/2018 FINAL  Final  Culture, blood (routine x 2)     Status: None (Preliminary result)   Collection Time: 10/13/18 10:25 AM  Result Value Ref Range Status   Specimen Description   Final    BLOOD RIGHT ARM Performed at [facility] [address] [place], [place] [address]    Special Requests   Final    BOTTLES DRAWN AEROBIC AND ANAEROBIC Blood Culture adequate volume Performed at [facility] [address], [place], [place] [address]    Culture   Final    NO GROWTH 2 DAYS Performed at [facility] [address] [place], [place] [address]    Report Status PENDING  Incomplete  Culture, blood (routine x 2)     Status: None (Preliminary result)   Collection Time: 10/13/18 10:30 AM  Result Value Ref Range Status   Specimen Description   Final    BLOOD RIGHT HAND Performed at [facility], [place] [address], [place], [place] [address]    Special Requests   Final    BOTTLES DRAWN AEROBIC ONLY Blood Culture adequate volume Performed at [facility] [address] [place], [place] [address]    Culture   Final    NO GROWTH 2 DAYS Performed at [facility] [address] [place], [place] [address]    Report Status PENDING  Incomplete  MRSA PCR Screening     Status: None   Collection Time: 10/13/18  3:15 PM  Result Value Ref Range Status   MRSA by PCR NEGATIVE NEGATIVE Final    Comment:        The GeneXpert MRSA Assay (FDA approved for NASAL specimens only), is one component of Melody Cirrincione comprehensive MRSA  colonization surveillance program. It is not intended to diagnose MRSA infection nor to guide or monitor treatment for MRSA infections. Performed at [facility], [place] [address] [place], [place] [address]          Radiology Studies: No results found.      Scheduled Meds: . magic mouthwash w/lidocaine  10 mL Oral QID  . mouth rinse  15 mL Mouth Rinse BID  . phenytoin (DILANTIN) IV  100 mg Intravenous Q8H  . sodium chloride flush  3 mL Intravenous Q12H   Continuous Infusions: . sodium chloride       LOS: 10 days    Time spent: over 30 min    Fayrene Helper, MD [facility] Pager see AMION for pager  If 7PM-7AM, please contact night-coverage [email or web address] Password [email or web address] 10/15/2018, 7:10 PM

## 2018-10-15 NOTE — Progress Notes (Signed)
Daily Progress Note   Patient Name: Sean Murray       Date: 10/15/2018 DOB: 10-Oct-1955  Age: 63 y.o. MRN#: [id number] Attending Physician: Elodia Florence., * Primary Care Physician: [name], MD Admit Date: 10/22/2018  Reason for Consultation/Follow-up: Establishing goals of care  Subjective: Chart reviewed including review of prn medications.  Sean Murray is unresponsive today with periods of apnea of > 20 seconds.  Mix of family and friends at bedside.   Length of Stay: 10  Current Medications: Scheduled Meds:  . magic mouthwash w/lidocaine  10 mL Oral QID  . mouth rinse  15 mL Mouth Rinse BID  . phenytoin (DILANTIN) IV  100 mg Intravenous Q8H  . sodium chloride flush  3 mL Intravenous Q12H    Continuous Infusions: . sodium chloride      PRN Meds: sodium chloride, acetaminophen **OR** acetaminophen, antiseptic oral rinse, bisacodyl, diphenhydrAMINE, glycopyrrolate **OR** glycopyrrolate **OR** glycopyrrolate, haloperidol **OR** haloperidol **OR** haloperidol lactate, HYDROmorphone (DILAUDID) injection, LORazepam **OR** LORazepam **OR** LORazepam, ondansetron **OR** ondansetron (ZOFRAN) IV, oxymetazoline, polyvinyl alcohol, sodium chloride flush  Physical Exam         Does not participate in encounter.  Actively dying. Diminished with periods of apnea noted. Abdomen is not distended Has some edema S 1 S 2  Has low BP  Vital Signs: BP (!) 98/53 (BP Location: Right Arm)   Pulse 83   Temp (!) 102.5 F (39.2 C) (Axillary)   Resp 20   Ht 5\' 2"  (1.575 m)   Wt 52 kg   SpO2 95%   BMI 20.97 kg/m  SpO2: SpO2: 95 % O2 Device: O2 Device: Room Air O2 Flow Rate: O2 Flow Rate (L/min): 2 L/min  Intake/output summary:   Intake/Output Summary (Last 24 hours) at 10/15/2018  1537 Last data filed at 10/15/2018 1300 Gross per 24 hour  Intake 120 ml  Output 1050 ml  Net -930 ml   LBM: Last BM Date: 10/10/18 Baseline Weight: Weight: 52 kg Most recent weight: Weight: 52 kg      PPS 10% Palliative Assessment/Data:    Flowsheet Rows     Most Recent Value  Intake Tab  Referral Department  Hospitalist  Unit at Time of Referral  ER  Date Notified  10/06/18  Palliative Care Type  New Palliative care  Reason  for referral  Clarify Goals of Care  Date of Admission  10/20/2018  Date first seen by Palliative Care  10/06/18  # of days Palliative referral response time  0 Day(s)  # of days IP prior to Palliative referral  1  Clinical Assessment  Psychosocial & Spiritual Assessment  Palliative Care Outcomes      Patient Active Problem List   Diagnosis Date Noted  . Multifocal pneumonia   . Fever   . Metabolic encephalopathy   . Pressure injury of skin 10/06/2018  . DNR (do not resuscitate)   . Dementia associated with other underlying disease without behavioral disturbance ([place])   . Palliative care encounter   . Acute renal failure (ARF) ([place]) 10/21/2018  . Dehydration 09/30/2018  . QT prolongation 10/13/2018  . Hypernatremia 09/29/2018  . Down's syndrome 04/13/2017  . Memory disorder 04/13/2017  . HOH (hard of hearing) 04/13/2017  . Generalized abdominal pain 12/23/2016  . Abnormal findings-gastrointestinal tract 12/23/2016    Palliative Care Assessment & Plan   Patient Profile:    Assessment:  63 yo gentleman from group home, has MR, admitted with renal failure, generalized weakness, poor PO intake, oral ulcers.  PMT following for symptom management and goals of care discussions.    Recommendations/Plan: - Sean Murray is actively dying.  Plan moving forward is for full comfort care. - Pain: dilaudid as needed - anxiety: ativan as needed -agitation: haldol as needed -secretions: robinul as needed -seizures: continue dilantin  Code Status:     Code Status Orders  (From admission, onward)         Start     Ordered   10/06/18 1446  Do not attempt resuscitation (DNR)  Continuous    Question Answer Comment  In the event of cardiac or respiratory ARREST Do not call a "code blue"   In the event of cardiac or respiratory ARREST Do not perform Intubation, CPR, defibrillation or ACLS   In the event of cardiac or respiratory ARREST Use medication by any route, position, wound care, and other measures to relive pain and suffering. May use oxygen, suction and manual treatment of airway obstruction as needed for comfort.   Comments Per Guardian Fenton Malling.  DNR but full scope treatment otherwise.      10/06/18 1446        Code Status History    Date Active Date Inactive Code Status Order ID Comments User Context   10/19/2018 2055 10/06/2018 1446 Full Code [id number]  Toy Baker, MD Inpatient       Prognosis:   Hours - Days  Discharge Planning:  Anticipate hospital death  Care plan was discussed with Dr. Florene Glen Thank you for allowing the Palliative Medicine Team to assist in the care of this patient.   Total Time 20 Prolonged Time Billed  no       Greater than 50%  of this time was spent counseling and coordinating care related to the above assessment and plan.  Micheline Rough, MD  Please contact Palliative Medicine Team phone at [phone] for questions and concerns.

## 2018-10-16 LAB — TYPE AND SCREEN
ABO/RH(D): A POS
Antibody Screen: NEGATIVE
Unit division: 0
Unit division: 0
Unit division: 0

## 2018-10-16 LAB — BPAM RBC
Blood Product Expiration Date: 202002082359
Blood Product Expiration Date: 202002082359
Blood Product Expiration Date: 202002082359
ISSUE DATE / TIME: 202001161518
ISSUE DATE / TIME: 202001170902
Unit Type and Rh: 6200
Unit Type and Rh: 6200
Unit Type and Rh: 6200

## 2018-10-16 MED ORDER — HYDROMORPHONE BOLUS VIA INFUSION
0.5000 mg | INTRAVENOUS | Status: DC | PRN
  Filled 2018-10-16: qty 1

## 2018-10-16 MED ORDER — SODIUM CHLORIDE 0.9 % IV SOLN
0.3000 mg/h | INTRAVENOUS | Status: DC
  Administered 2018-10-16: 0.3 mg/h via INTRAVENOUS
  Filled 2018-10-16: qty 2.5

## 2018-10-16 NOTE — Progress Notes (Signed)
Daily Progress Note   Patient Name: Sean Murray       Date: 10/16/2018 DOB: [birth year]  Age: 63 y.o. MRN#: [id number] Attending Physician: Sean Murray., * Primary Care Physician: Sean Low, MD Admit Date: 10/06/2018  Reason for Consultation/Follow-up: Establishing goals of care  Subjective: Chart reviewed including review of prn medications.  Sean Murray is unresponsive today with periods of apnea.  Family, including sister/HCPOA Sean Murray) at the bedside.  She reports spending the night and that Sean Murray had several periods of gasping that improved following administration of dilaudid.  Reviewed prn usage and discussed starting continuous infusion to better manage symptoms.  Length of Stay: 11  Current Medications: Scheduled Meds:  . magic mouthwash w/lidocaine  10 mL Oral QID  . mouth rinse  15 mL Mouth Rinse BID  . phenytoin (DILANTIN) IV  100 mg Intravenous Q8H    Continuous Infusions: . HYDROmorphone      PRN Meds: acetaminophen **OR** acetaminophen, antiseptic oral rinse, bisacodyl, diphenhydrAMINE, glycopyrrolate **OR** glycopyrrolate **OR** glycopyrrolate, haloperidol **OR** haloperidol **OR** haloperidol lactate, HYDROmorphone, HYDROmorphone (DILAUDID) injection, LORazepam **OR** LORazepam **OR** LORazepam, ondansetron **OR** ondansetron (ZOFRAN) IV, oxymetazoline, polyvinyl alcohol  Physical Exam         Does not participate in encounter.  Actively dying. Diminished with periods of apnea noted. Abdomen is not distended Increasing edema S 1 S 2  Has Murray BP  Vital Signs: BP (!) 98/53 (BP Location: Right Arm)   Pulse 83   Temp (!) 102.5 F (39.2 C) (Axillary)   Resp 20   Ht 5' 2"  (1.575 m)   Wt 52 kg   SpO2 95%   BMI 20.97 kg/m  SpO2: SpO2: 95 % O2 Device:  O2 Device: Room Air O2 Flow Rate: O2 Flow Rate (L/min): 2 L/min  Intake/output summary:   Intake/Output Summary (Last 24 hours) at 10/16/2018 1217 Last data filed at 10/16/2018 7972 Gross per 24 hour  Intake 0 ml  Output 750 ml  Net -750 ml   LBM: Last BM Date: 10/10/18 Baseline Weight: Weight: 52 kg Most recent weight: Weight: 52 kg      PPS 10% Palliative Assessment/Data:    Flowsheet Rows     Most Recent Value  Intake Tab  Referral Department  Hospitalist  Unit at Time of Referral  ER  Date Notified  10/06/18  Palliative Care Type  New Palliative care  Reason for referral  Clarify Goals of Care  Date of Admission  10/27/2018  Date first seen by Palliative Care  10/06/18  # of days Palliative referral response time  0 Day(s)  # of days IP prior to Palliative referral  1  Clinical Assessment  Psychosocial & Spiritual Assessment  Palliative Care Outcomes      Patient Active Problem List   Diagnosis Date Noted  . Multifocal pneumonia   . Fever   . Metabolic encephalopathy   . Pressure injury of skin 10/06/2018  . DNR (do not resuscitate)   . Dementia associated with other underlying disease without behavioral disturbance ([facility])   . Palliative care encounter   . Acute renal failure (ARF) ([facility]) 10/27/2018  . Dehydration 10/21/2018  . QT prolongation 10/02/2018  . Hypernatremia 10/17/2018  . Down's syndrome 04/13/2017  . Memory disorder 04/13/2017  . HOH (hard of hearing) 04/13/2017  . Generalized abdominal pain 12/23/2016  . Abnormal findings-gastrointestinal tract 12/23/2016    Palliative Care Assessment & Plan   Patient Profile:    Assessment:  64 yo gentleman from group home, has MR, admitted with renal failure, generalized weakness, poor PO intake, oral ulcers.  PMT following for symptom management and goals of care discussions.    Recommendations/Plan: - Sean Murray is actively dying.  Plan moving forward is for full comfort care. - Met again with family  today and reviewed questions regarding care plan and anticipatory guidance moving forward.  - Pain/SOB:  Overall dilaudid prn needs have been increasing.  I discussed with his HCPOA and made recommendation for initiation of continuous infusion.  He has received 44m in the last 24 hours. Plan for dilaudid 0.340mhr with additional 0.46m59molus every 30 minutes as needed. - anxiety: ativan as needed -agitation: haldol as needed -secretions: robinul as needed -seizures: continue dilantin  Code Status:    Code Status Orders  (From admission, onward)         Start     Ordered   10/06/18 1446  Do not attempt resuscitation (DNR)  Continuous    Question Answer Comment  In the event of cardiac or respiratory ARREST Do not call a "code blue"   In the event of cardiac or respiratory ARREST Do not perform Intubation, CPR, defibrillation or ACLS   In the event of cardiac or respiratory ARREST Use medication by any route, position, wound care, and other measures to relive pain and suffering. May use oxygen, suction and manual treatment of airway obstruction as needed for comfort.   Comments Per Guardian Sean MallingDNR but full scope treatment otherwise.      10/06/18 1446        Code Status History    Date Active Date Inactive Code Status Order ID Comments User Context   09/28/2018 2055 10/06/2018 1446 Full Code [id number] Murray Inpatient       Prognosis:   Hours - Days  Discharge Planning:  Anticipate hospital death  Care plan was discussed with Dr. PowFlorene Murray you for allowing the Palliative Medicine Team to assist in the care of this patient.   Total Time 30 Prolonged Time Billed  no       Greater than 50%  of this time was spent counseling and coordinating care related to the above assessment and plan.  Sean Murray  Please contact Palliative Medicine Team phone at [phone] questions and concerns.

## 2018-10-16 NOTE — Progress Notes (Signed)
PROGRESS NOTE    Sean Murray  YQM:[id number] DOB: [birth year] DOA: 10/13/2018 PCP: [name], MD   Brief Narrative:  63 year old male with Sean Murray past medical history of mental retardation who lives in [facility] who is wheelchair-bound at baseline.  Her legal guardian who is apparently his sister who lives in [place].  Patient was brought into the hospital due to decreased oral intake for the past 3 days.  Apparently patient has dementia at baseline and has been progressively getting worse over the past many months.  Assessment & Plan:   Active Problems:   Down's syndrome   Acute renal failure (ARF) ([facility])   Dehydration   QT prolongation   Hypernatremia   Pressure injury of skin   DNR (do not resuscitate)   Dementia associated with other underlying disease without behavioral disturbance [facility])   Palliative care encounter   Metabolic encephalopathy   Fever   Multifocal pneumonia  Goals of Care: Maintaining comfort measures as Sean Murray continues to decline. Appreciate assistance of palliative care  Dilaudid gtt started per palliative Prn robinul, haldol, dilaudid, ativan, zofran for comfort Continuing phenytoin Dc abx, stop labs  Multifocal Pneumonia  Recurrent Fever  Concern for aspiration: CXR with multifocal pneumonia Follow blood cx Given goals of care, will d/c abx  Fever  Cellulitis versus Thrombophlebitis of the right upper extremity Patient had an IV access in the right upper extremity which infiltrated.  Doppler shows acute superficial thrombophlebitis.  This seems improved. Pt on ancef from 1/12 - 1/16 -> will d/c in setting of above MRSA PCR negative 1/2 blood cx with coagulase negative staph - suspect this is contaminant, but will plan to repeat cx once course of abx is complete for cellulitis (discussed with ID) (follow repeat blood cx)  Hypotension, etiology unclear BP fluctuating, apparently pt with hx of chronic hypotension Presented with BP's  in 70's and 80's BP's in 80's and 90's today, slightly lower than previous, transitioning to comfort measures as noted above Etiology was not immediately clear at admission.  No infection was present at the time of admission.   Cortisol level was normal (13.6), making adrenal insufficiency unlikely.  Lactic acid level was normal.   Echo with EF 40-45% with hypokinesis of anterior wall, anteroseptum and anterolateral walls - difficult to tell if related to LBBB or regional wall motion abnormality (per report, see below)   Chronic systolic CHF Diminished EF noted on echocardiogram.  No evidence for acute exacerbation.   Report as noted below Due to his other comorbidities and poor baseline functional status he is not Usher Hedberg candidate for further cardiac work-up or treatment.  Acute pulmonary embolism and lower extremity DVT Patient was found to have acute DVT in the lower extremity.   CT PE protocol with right lower lobe PE without evidence of right heart strain Heparin d/c'd given low hb and comfort measures now as noted above  Macrocytic anemia hemoglobin has been downtrending since 1/13 (but it was ~7.8 on 1/12).   S/p 2 units pRBC No obvious signs bleeding FOBT Labs suggestive of AOCD with iron deficiency.  Normal folate/b12.  No signs of bleeding and epistaxis seems resolved.    Delirium: likely in setting of pneumonia and acute hospitalization.   Delirium precautions Comfort measures as noted above  Acute epistaxis Possibly related to heparin.  Was placed on Afrin nasal spray.  Resolved.   Urinary Retention: Foley placed, will continue to monitor Renal function appears stable  Acute renal failure  with hypernatremia and hypokalemia and hypomagnesemia This was most likely due to poor oral intake over the past many days prior to admission.  Renal function is now normal.  Sodium and potassium levels have improved.  Magnesium is normal.     Mild rhabdomyolysis CK level was noted  to be elevated.  Improved with hydration.  Levels have improved.  QT prolongation Avoid QT prolonging medications.  Monitor on telemetry.  Mildly abnormal LFTs Most likely in the setting of rhabdomyolysis.  Hepatitis panel is unremarkable.  Stable.  Patient with known history of cholelithiasis.  However his right upper quadrant is benign on examination.  Do not anticipate any further testing at this time. CTM  History of seizure disorder EEG was done which revealed epileptiform activity.  Unclear if this was clinically significant.  However patient's Dilantin level was subtherapeutic at admission.  After discussions with neurology Sean Murray loading dose of Dilantin was given.  No repeat EEG per neurology.  Dilantin level was therapeutic on 1/12 when adjusted for low albumin.  Continue current dose of phenytoin.  Change to oral when he is able to take oral medications consistently. May need to consider discussing with neurology again if not improving (neurology note from 1/10), but for now, continue to monitor Continue dilantin  Acute metabolic encephalopathy Patient with known history of mental retardation.  See above under seizure disorder as well.  Persistent delirium as noted above.  Hypoalbuminemia/severe protein calorie malnutrition Oral intake has been poor.  Now improving.  History of mental retardation Patient has poor quality of life at baseline.  Apparently has been declining over the past several months.  Has had falls.  He is now wheelchair-bound.  Palliative medicine is following.  Discussions held with the patient's legal guardian.  He is now DNR.  For many days patient did not show any improvement but in the last 24 hours he appears to have perked up more.  His blood pressures have improved.  Palliative medicine continues to follow.   DVT prophylaxis: heparin, on hold Code Status: DNR Family Communication: Sister, [name] and siblings Disposition Plan: pending  improvement   Consultants:   neurology  Procedures:  Echo Study Conclusions  - Left ventricle: Systolic function was mildly to moderately   reduced. The estimated ejection fraction was in the range of 40%   to 45%. Cannot exclude hypokinesis of the anteroseptal, anterior,   and anterolateral myocardium. - Ventricular septum: Septal motion showed dyssynergy. These   changes are consistent with Sean Murray left bundle branch block. - Impressions: Incomplete sutdy as patient terminated study prior   to exam completion. On available images EF appears to be in the   40-45% range with hypokinesis of the anterior wall, aanteroseptum   and anterolateral walls. As there are no 2-chamber images hard to   tell if this is all due to LBBB or if there is also Sean Murray regional   wall motion abnormlaity due to previous infarct.  Impressions:  - Incomplete sutdy as patient terminated study prior to exam   completion. On available images EF appears to be in the 40-45%   range with hypokinesis of the anterior wall, aanteroseptum and   anterolateral walls. As there are no 2-chamber images hard to   tell if this is all due to LBBB or if there is also Maricsa Sammons regional   wall motion abnormlaity due to previous infarct.   Antimicrobials:  Anti-infectives (From admission, onward)   Start     Dose/Rate Route Frequency Ordered  Stop   10/14/18 0800  vancomycin (VANCOCIN) IVPB 1000 mg/200 mL premix  Status:  Discontinued     1,000 mg 200 mL/hr over 60 Minutes Intravenous Every 24 hours 10/13/18 1410 10/14/18 1345   10/13/18 2200  ceFEPIme (MAXIPIME) 1 g in sodium chloride 0.9 % 100 mL IVPB  Status:  Discontinued     1 g 200 mL/hr over 30 Minutes Intravenous Every 12 hours 10/13/18 1410 10/14/18 1457   10/13/18 1315  vancomycin (VANCOCIN) IVPB 1000 mg/200 mL premix     1,000 mg 200 mL/hr over 60 Minutes Intravenous  Once 10/13/18 1300 10/13/18 1555   10/13/18 1300  ceFEPIme (MAXIPIME) 2 g in sodium chloride 0.9 % 100  mL IVPB     2 g 200 mL/hr over 30 Minutes Intravenous  Once 10/13/18 1300 10/13/18 1516   10/10/18 2200  ceFAZolin (ANCEF) IVPB 2g/100 mL premix  Status:  Discontinued     2 g 200 mL/hr over 30 Minutes Intravenous Every 8 hours 10/10/18 1510 10/13/18 1252   10/09/18 1400  ceFAZolin (ANCEF) IVPB 1 g/50 mL premix  Status:  Discontinued     1 g 100 mL/hr over 30 Minutes Intravenous Every 8 hours 10/09/18 1256 10/10/18 1510         Subjective: Unresponsive Family at beside ([place] and brother)  Objective: Vitals:   10/14/18 0915 10/14/18 1055 10/14/18 1411 10/15/18 0946  BP: (!) 90/47 (!) 86/50 (!) 98/53   Pulse: 89 80 83   Resp:   20   Temp: 99.1 F (37.3 C) 99 F (37.2 C) 99.2 F (37.3 C) (!) 102.5 F (39.2 C)  TempSrc: Axillary Axillary Oral Axillary  SpO2:  98% 95%   Weight:      Height:        Intake/Output Summary (Last 24 hours) at 10/16/2018 1246 Last data filed at 10/16/2018 5631 Gross per 24 hour  Intake 0 ml  Output 750 ml  Net -750 ml   Filed Weights   10/06/18 0700  Weight: 52 kg    Examination:  General: unresponsive Cardiovascular: rrr, no mgr Lungs: gasping respirations Abdomen: Soft Neurological: unresponsive Skin: Warm and dry. .    Data Reviewed: I have personally reviewed following labs and imaging studies  CBC: Recent Labs  Lab 10/10/18 0308 10/11/18 0236 10/12/18 0445 10/12/18 1538 10/12/18 1836 10/13/18 0755 10/14/18 0455  WBC 19.7* 14.8* 10.4  --   --  12.8* 10.6*  HGB 9.0* 8.3* 7.9* 7.1* 7.7* 6.4* 6.6*  HCT 29.3* 27.4* 25.4* 23.4* 25.4* 20.8* 21.1*  MCV 107.7* 106.6* 105.4*  --   --  107.2* 100.5*  PLT 238 280 280  --   --  269 497   Basic Metabolic Panel: Recent Labs  Lab 10/10/18 0308 10/11/18 0236 10/12/18 0445 10/13/18 0755 10/14/18 0455  NA 141 138 136 136 133*  K 3.9 3.8 3.9 4.0 4.0  CL 113* 112* 104 103 103  CO2 21* 20* 23 23 22   GLUCOSE 85 96 107* 109* 102*  BUN 11 9 8 11 13   CREATININE 0.90 0.90 0.90  1.01 1.06  CALCIUM 6.9* 6.9* 7.1* 6.8* 6.8*  MG 2.0  --   --  1.8 1.8   GFR: Estimated Creatinine Clearance: 51.8 mL/min (by C-G formula based on SCr of 1.06 mg/dL). Liver Function Tests: Recent Labs  Lab 10/13/18 0755 10/14/18 0455  AST 48* 47*  ALT 17 14  ALKPHOS 145* 113  BILITOT 0.3 0.7  PROT 4.8* 4.5*  ALBUMIN  1.4* 1.4*   No results for input(s): LIPASE, AMYLASE in the last 168 hours. No results for input(s): AMMONIA in the last 168 hours. Coagulation Profile: No results for input(s): INR, PROTIME in the last 168 hours. Cardiac Enzymes: No results for input(s): CKTOTAL, CKMB, CKMBINDEX, TROPONINI in the last 168 hours. BNP (last 3 results) No results for input(s): PROBNP in the last 8760 hours. HbA1C: No results for input(s): HGBA1C in the last 72 hours. CBG: No results for input(s): GLUCAP in the last 168 hours. Lipid Profile: No results for input(s): CHOL, HDL, LDLCALC, TRIG, CHOLHDL, LDLDIRECT in the last 72 hours. Thyroid Function Tests: No results for input(s): TSH, T4TOTAL, FREET4, T3FREE, THYROIDAB in the last 72 hours. Anemia Panel: No results for input(s): VITAMINB12, FOLATE, FERRITIN, TIBC, IRON, RETICCTPCT in the last 72 hours. Sepsis Labs: No results for input(s): PROCALCITON, LATICACIDVEN in the last 168 hours.  Recent Results (from the past 240 hour(s))  Culture, blood (Routine X 2) w Reflex to ID Panel     Status: None   Collection Time: 10/09/18  8:54 AM  Result Value Ref Range Status   Specimen Description   Final    BLOOD RIGHT HAND Performed at [facility] [address], [place], [place] [address]    Special Requests   Final    BOTTLES DRAWN AEROBIC ONLY Blood Culture adequate volume Performed at [facility] [address], [place], [place] [address]    Culture   Final    NO GROWTH 5 DAYS Performed at [facility], [place] [address], [place], [place] [address]    Report Status 10/14/2018 FINAL   Final  Culture, blood (Routine X 2) w Reflex to ID Panel     Status: Abnormal   Collection Time: 10/09/18  8:54 AM  Result Value Ref Range Status   Specimen Description BLOOD RIGHT ARM  Final   Special Requests   Final    BOTTLES DRAWN AEROBIC ONLY Blood Culture results may not be optimal due to an inadequate volume of blood received in culture bottles Performed at [facility], [place] [address] [place], [place] [address]    Culture  Setup Time   Final    GRAM POSITIVE COCCI AEROBIC BOTTLE ONLY Organism ID to follow CRITICAL RESULT CALLED TO, READ BACK BY AND VERIFIED WITH: Sean Murray PharmD 14:55 10/10/18 ([email or web address])    Culture (Sean Murray)  Final    STAPHYLOCOCCUS SPECIES (COAGULASE NEGATIVE) THE SIGNIFICANCE OF ISOLATING THIS ORGANISM FROM Sean Murray SINGLE SET OF BLOOD CULTURES WHEN MULTIPLE SETS ARE DRAWN IS UNCERTAIN. PLEASE NOTIFY THE MICROBIOLOGY DEPARTMENT WITHIN ONE WEEK IF SPECIATION AND SENSITIVITIES ARE REQUIRED. Performed at [facility], [place] [address] [place], [place] [address]    Report Status 10/12/2018 FINAL  Final  Blood Culture ID Panel (Reflexed)     Status: Abnormal   Collection Time: 10/09/18  8:54 AM  Result Value Ref Range Status   Enterococcus species NOT DETECTED NOT DETECTED Final   Listeria monocytogenes NOT DETECTED NOT DETECTED Final   Staphylococcus species DETECTED (Sean Murray) NOT DETECTED Final    Comment: Methicillin (oxacillin) susceptible coagulase negative staphylococcus. Possible blood culture contaminant (unless isolated from more than one blood culture draw or clinical case suggests pathogenicity). No antibiotic treatment is indicated for blood  culture contaminants. CRITICAL RESULT CALLED TO, READ BACK BY AND VERIFIED WITH: Sean Murray PharmD 14:55 10/10/18 ([email or web address])    Staphylococcus aureus (BCID) NOT DETECTED NOT DETECTED Final  Methicillin resistance NOT DETECTED NOT DETECTED Final   Streptococcus species NOT DETECTED NOT DETECTED Final    Streptococcus agalactiae NOT DETECTED NOT DETECTED Final   Streptococcus pneumoniae NOT DETECTED NOT DETECTED Final   Streptococcus pyogenes NOT DETECTED NOT DETECTED Final   Acinetobacter baumannii NOT DETECTED NOT DETECTED Final   Enterobacteriaceae species NOT DETECTED NOT DETECTED Final   Enterobacter cloacae complex NOT DETECTED NOT DETECTED Final   Escherichia coli NOT DETECTED NOT DETECTED Final   Klebsiella oxytoca NOT DETECTED NOT DETECTED Final   Klebsiella pneumoniae NOT DETECTED NOT DETECTED Final   Proteus species NOT DETECTED NOT DETECTED Final   Serratia marcescens NOT DETECTED NOT DETECTED Final   Haemophilus influenzae NOT DETECTED NOT DETECTED Final   Neisseria meningitidis NOT DETECTED NOT DETECTED Final   Pseudomonas aeruginosa NOT DETECTED NOT DETECTED Final   Candida albicans NOT DETECTED NOT DETECTED Final   Candida glabrata NOT DETECTED NOT DETECTED Final   Candida krusei NOT DETECTED NOT DETECTED Final   Candida parapsilosis NOT DETECTED NOT DETECTED Final   Candida tropicalis NOT DETECTED NOT DETECTED Final    Comment: Performed at [facility], [place] [address] [place], [place] [address]  Culture, Urine     Status: None   Collection Time: 10/13/18  9:52 AM  Result Value Ref Range Status   Specimen Description   Final    URINE, RANDOM Performed at [facility] [address], [place], [place] [address]    Special Requests   Final    NONE Performed at [facility], [place] [address], [place], [place] [address]    Culture   Final    NO GROWTH Performed at [facility], [place] [address], [place], [place] [address]    Report Status 10/14/2018 FINAL  Final  Culture, blood (routine x 2)     Status: None (Preliminary result)   Collection Time: 10/13/18 10:25 AM  Result Value Ref Range Status   Specimen Description   Final    BLOOD RIGHT ARM Performed at [facility] [address]  [place], [place] [address]    Special Requests   Final    BOTTLES DRAWN AEROBIC AND ANAEROBIC Blood Culture adequate volume Performed at [facility] [address], [place], [place] [address]    Culture   Final    NO GROWTH 2 DAYS Performed at [facility] [address], [place], [place] [address]    Report Status PENDING  Incomplete  Culture, blood (routine x 2)     Status: None (Preliminary result)   Collection Time: 10/13/18 10:30 AM  Result Value Ref Range Status   Specimen Description   Final    BLOOD RIGHT HAND Performed at [facility] [address], [place], [place] [address]    Special Requests   Final    BOTTLES DRAWN AEROBIC ONLY Blood Culture adequate volume Performed at [facility] [address] [place], [place] [address]    Culture   Final    NO GROWTH 2 DAYS Performed at [facility] [address] [place], [place] [address]    Report Status PENDING  Incomplete  MRSA PCR Screening     Status: None   Collection Time: 10/13/18  3:15 PM  Result Value Ref Range Status   MRSA by PCR NEGATIVE NEGATIVE Final    Comment:        The GeneXpert  MRSA Assay (FDA approved for NASAL specimens only), is one component of [facility] comprehensive MRSA colonization surveillance program. It is not intended to diagnose MRSA infection nor to guide or monitor treatment for MRSA infections. Performed at [facility], [place] [address] [place], [place] [address]          Radiology Studies: No results found.      Scheduled Meds: . magic mouthwash w/lidocaine  10 mL Oral QID  . mouth rinse  15 mL Mouth Rinse BID  . phenytoin (DILANTIN) IV  100 mg Intravenous Q8H   Continuous Infusions: . HYDROmorphone       LOS: 11 days    Time spent: over 30 min    Fayrene Helper, MD [facility] Pager see AMION for pager  If 7PM-7AM, please contact  night-coverage [email or web address] Password [facility] 10/16/2018, 12:46 PM

## 2018-10-17 NOTE — Care Management Important Message (Signed)
Important Message  Patient Details  Name: Sean Murray MRN: [id number] Date of Birth: [birth year]   Medicare Important Message Given:  Yes    Kerin Salen 10/17/2018, 11:34 AMImportant Message  Patient Details  Name: Sean Murray MRN: [id number] Date of Birth: [birth year]   Medicare Important Message Given:  Yes    Kerin Salen 10/17/2018, 11:34 AM

## 2018-10-17 NOTE — Progress Notes (Signed)
PROGRESS NOTE    Sean Murray  GDJ:[id number] DOB: 1956/07/04 DOA: 10/17/2018 PCP: Sean Low, MD   Brief Narrative:  63 year old male with a past medical history of mental retardation who lives in a group home who is wheelchair-bound at baseline.  Her legal guardian who is apparently his sister who lives in [place].  Patient was brought into the hospital due to decreased oral intake for the past 3 days.  Apparently patient has dementia at baseline and has been progressively getting worse over the past many months.  Assessment & Plan:   Active Problems:   Down's syndrome   Acute renal failure (ARF) (HCC)   Dehydration   QT prolongation   Hypernatremia   Pressure injury of skin   DNR (do not resuscitate)   Dementia associated with other underlying disease without behavioral disturbance [facility])   Palliative care encounter   Metabolic encephalopathy   Fever   Multifocal pneumonia  Goals of Care: Maintaining comfort measures as Sean Murray continues to decline.  Appears comfortable on dilaudid gtt. Appreciate assistance of palliative care  Dilaudid gtt started per palliative Prn robinul, haldol, dilaudid, ativan, zofran for comfort Continuing phenytoin Dc abx, stop labs  Multifocal Pneumonia  Recurrent Fever  Concern for aspiration: CXR with multifocal pneumonia Follow blood cx Given goals of care, will d/c abx  Fever  Cellulitis versus Thrombophlebitis of the right upper extremity Patient had an IV access in the right upper extremity which infiltrated.  Doppler shows acute superficial thrombophlebitis.  This seems improved. Pt on ancef from 1/12 - 1/16 -> will d/c in setting of above MRSA PCR negative 1/2 blood cx with coagulase negative staph - suspect this is contaminant, but will plan to repeat cx once course of abx is complete for cellulitis (discussed with ID) (follow repeat blood cx)  Hypotension, etiology unclear BP fluctuating, apparently pt with hx of  chronic hypotension Presented with BP's in 70's and 80's BP's in 80's and 90's today, slightly lower than previous, transitioning to comfort measures as noted above Etiology was not immediately clear at admission.  No infection was present at the time of admission.   Cortisol level was normal (13.6), making adrenal insufficiency unlikely.  Lactic acid level was normal.   Echo with EF 40-45% with hypokinesis of anterior wall, anteroseptum and anterolateral walls - difficult to tell if related to LBBB or regional wall motion abnormality (per report, see below)   Chronic systolic CHF Diminished EF noted on echocardiogram.  No evidence for acute exacerbation.   Report as noted below Due to his other comorbidities and poor baseline functional status he is not a candidate for further cardiac work-up or treatment.  Acute pulmonary embolism and lower extremity DVT Patient was found to have acute DVT in the lower extremity.   CT PE protocol with right lower lobe PE without evidence of right heart strain Heparin d/c'd given Murray hb and comfort measures now as noted above  Macrocytic anemia hemoglobin has been downtrending since 1/13 (but it was ~7.8 on 1/12).   S/p 2 units pRBC No obvious signs bleeding FOBT Labs suggestive of AOCD with iron deficiency.  Normal folate/b12.  No signs of bleeding and epistaxis seems resolved.    Delirium: likely in setting of pneumonia and acute hospitalization.   Delirium precautions Comfort measures as noted above  Acute epistaxis Possibly related to heparin.  Was placed on Afrin nasal spray.  Resolved.   Urinary Retention: Foley placed, will continue to monitor Renal function  appears stable  Acute renal failure with hypernatremia and hypokalemia and hypomagnesemia This was most likely due to poor oral intake over the past many days prior to admission.  Renal function is now normal.  Sodium and potassium levels have improved.  Magnesium is normal.      Mild rhabdomyolysis CK level was noted to be elevated.  Improved with hydration.  Levels have improved.  QT prolongation Avoid QT prolonging medications.  Monitor on telemetry.  Mildly abnormal LFTs Most likely in the setting of rhabdomyolysis.  Hepatitis panel is unremarkable.  Stable.  Patient with known history of cholelithiasis.  However his right upper quadrant is benign on examination.  Do not anticipate any further testing at this time. CTM  History of seizure disorder EEG was done which revealed epileptiform activity.  Unclear if this was clinically significant.  However patient's Dilantin level was subtherapeutic at admission.  After discussions with neurology a loading dose of Dilantin was given.  No repeat EEG per neurology.  Dilantin level was therapeutic on 1/12 when adjusted for Murray albumin.  Continue current dose of phenytoin.  Change to oral when he is able to take oral medications consistently. May need to consider discussing with neurology again if not improving (neurology note from 1/10), but for now, continue to monitor Continue dilantin  Acute metabolic encephalopathy Patient with known history of mental retardation.  See above under seizure disorder as well.  Persistent delirium as noted above.  Hypoalbuminemia/severe protein calorie malnutrition Oral intake has been poor.  Now improving.  History of mental retardation Patient has poor quality of life at baseline.  Apparently has been declining over the past several months.  Has had falls.  He is now wheelchair-bound.  Palliative medicine is following.  Discussions held with the patient's legal guardian.  He is now DNR.  For many days patient did not show any improvement but in the last 24 hours he appears to have perked up more.  His blood pressures have improved.  Palliative medicine continues to follow.   DVT prophylaxis: heparin, on hold Code Status: DNR Family Communication: friends at  bedside Disposition Plan: pending improvement   Consultants:   neurology  Procedures:  Echo Study Conclusions  - Left ventricle: Systolic function was mildly to moderately   reduced. The estimated ejection fraction was in the range of 40%   to 45%. Cannot exclude hypokinesis of the anteroseptal, anterior,   and anterolateral myocardium. - Ventricular septum: Septal motion showed dyssynergy. These   changes are consistent with a left bundle branch block. - Impressions: Incomplete sutdy as patient terminated study prior   to exam completion. On available images EF appears to be in the   40-45% range with hypokinesis of the anterior wall, aanteroseptum   and anterolateral walls. As there are no 2-chamber images hard to   tell if this is all due to LBBB or if there is also a regional   wall motion abnormlaity due to previous infarct.  Impressions:  - Incomplete sutdy as patient terminated study prior to exam   completion. On available images EF appears to be in the 40-45%   range with hypokinesis of the anterior wall, aanteroseptum and   anterolateral walls. As there are no 2-chamber images hard to   tell if this is all due to LBBB or if there is also a regional   wall motion abnormlaity due to previous infarct.   Antimicrobials:  Anti-infectives (From admission, onward)   Start  Dose/Rate Route Frequency Ordered Stop   10/14/18 0800  vancomycin (VANCOCIN) IVPB 1000 mg/200 mL premix  Status:  Discontinued     1,000 mg 200 mL/hr over 60 Minutes Intravenous Every 24 hours 10/13/18 1410 10/14/18 1345   10/13/18 2200  ceFEPIme (MAXIPIME) 1 g in sodium chloride 0.9 % 100 mL IVPB  Status:  Discontinued     1 g 200 mL/hr over 30 Minutes Intravenous Every 12 hours 10/13/18 1410 10/14/18 1457   10/13/18 1315  vancomycin (VANCOCIN) IVPB 1000 mg/200 mL premix     1,000 mg 200 mL/hr over 60 Minutes Intravenous  Once 10/13/18 1300 10/13/18 1555   10/13/18 1300  ceFEPIme  (MAXIPIME) 2 g in sodium chloride 0.9 % 100 mL IVPB     2 g 200 mL/hr over 30 Minutes Intravenous  Once 10/13/18 1300 10/13/18 1516   10/10/18 2200  ceFAZolin (ANCEF) IVPB 2g/100 mL premix  Status:  Discontinued     2 g 200 mL/hr over 30 Minutes Intravenous Every 8 hours 10/10/18 1510 10/13/18 1252   10/09/18 1400  ceFAZolin (ANCEF) IVPB 1 g/50 mL premix  Status:  Discontinued     1 g 100 mL/hr over 30 Minutes Intravenous Every 8 hours 10/09/18 1256 10/10/18 1510         Subjective: Unresponsive. Friends at bedside.  Objective: Vitals:   10/16/18 1300 10/16/18 1600 10/17/18 0601 10/17/18 1500  BP:   (!) 95/55 100/60  Pulse:   (!) 116   Resp:  (!) 0 16 17  Temp: (!) 100.8 F (38.2 C)  100 F (37.8 C) 99 F (37.2 C)  TempSrc: Axillary  Oral Axillary  SpO2:   (!) 68%   Weight:      Height:        Intake/Output Summary (Last 24 hours) at 10/17/2018 1847 Last data filed at 10/17/2018 1800 Gross per 24 hour  Intake 15.6 ml  Output 1000 ml  Net -984.4 ml   Filed Weights   10/06/18 0700  Weight: 52 kg    Examination:  General: unresponsive Lungs: gasping respirations Abdomen: Soft Neurological: unresponsive Skin: warm and dry Mild swelling to upper extremities    Data Reviewed: I have personally reviewed following labs and imaging studies  CBC: Recent Labs  Lab 10/11/18 0236 10/12/18 0445 10/12/18 1538 10/12/18 1836 10/13/18 0755 10/14/18 0455  WBC 14.8* 10.4  --   --  12.8* 10.6*  HGB 8.3* 7.9* 7.1* 7.7* 6.4* 6.6*  HCT 27.4* 25.4* 23.4* 25.4* 20.8* 21.1*  MCV 106.6* 105.4*  --   --  107.2* 100.5*  PLT 280 280  --   --  269 466   Basic Metabolic Panel: Recent Labs  Lab 10/11/18 0236 10/12/18 0445 10/13/18 0755 10/14/18 0455  NA 138 136 136 133*  K 3.8 3.9 4.0 4.0  CL 112* 104 103 103  CO2 20* 23 23 22   GLUCOSE 96 107* 109* 102*  BUN 9 8 11 13   CREATININE 0.90 0.90 1.01 1.06  CALCIUM 6.9* 7.1* 6.8* 6.8*  MG  --   --  1.8 1.8    GFR: Estimated Creatinine Clearance: 51.8 mL/min (by C-G formula based on SCr of 1.06 mg/dL). Liver Function Tests: Recent Labs  Lab 10/13/18 0755 10/14/18 0455  AST 48* 47*  ALT 17 14  ALKPHOS 145* 113  BILITOT 0.3 0.7  PROT 4.8* 4.5*  ALBUMIN 1.4* 1.4*   No results for input(s): LIPASE, AMYLASE in the last 168 hours. No results for input(s):  AMMONIA in the last 168 hours. Coagulation Profile: No results for input(s): INR, PROTIME in the last 168 hours. Cardiac Enzymes: No results for input(s): CKTOTAL, CKMB, CKMBINDEX, TROPONINI in the last 168 hours. BNP (last 3 results) No results for input(s): PROBNP in the last 8760 hours. HbA1C: No results for input(s): HGBA1C in the last 72 hours. CBG: No results for input(s): GLUCAP in the last 168 hours. Lipid Profile: No results for input(s): CHOL, HDL, LDLCALC, TRIG, CHOLHDL, LDLDIRECT in the last 72 hours. Thyroid Function Tests: No results for input(s): TSH, T4TOTAL, FREET4, T3FREE, THYROIDAB in the last 72 hours. Anemia Panel: No results for input(s): VITAMINB12, FOLATE, FERRITIN, TIBC, IRON, RETICCTPCT in the last 72 hours. Sepsis Labs: No results for input(s): PROCALCITON, LATICACIDVEN in the last 168 hours.  Recent Results (from the past 240 hour(s))  Culture, blood (Routine X 2) w Reflex to ID Panel     Status: None   Collection Time: 10/09/18  8:54 AM  Result Value Ref Range Status   Specimen Description   Final    BLOOD RIGHT HAND Performed at [facility] [address] [place], [place] [address]    Special Requests   Final    BOTTLES DRAWN AEROBIC ONLY Blood Culture adequate volume Performed at [facility] [address] [place], [place] [address]    Culture   Final    NO GROWTH 5 DAYS Performed at [facility], [place] [address], [place], [place] [address]    Report Status 10/14/2018 FINAL  Final  Culture, blood (Routine X 2) w Reflex to ID Panel     Status:  Abnormal   Collection Time: 10/09/18  8:54 AM  Result Value Ref Range Status   Specimen Description BLOOD RIGHT ARM  Final   Special Requests   Final    BOTTLES DRAWN AEROBIC ONLY Blood Culture results may not be optimal due to an inadequate volume of blood received in culture bottles Performed at [facility], [place] [address], [place], [place] [address]    Culture  Setup Time   Final    GRAM POSITIVE COCCI AEROBIC BOTTLE ONLY Organism ID to follow CRITICAL RESULT CALLED TO, READ BACK BY AND VERIFIED WITH: Shelda Jakes PharmD 14:55 10/10/18 ([email or web address])    Culture (A)  Final    STAPHYLOCOCCUS SPECIES (COAGULASE NEGATIVE) THE SIGNIFICANCE OF ISOLATING THIS ORGANISM FROM A SINGLE SET OF BLOOD CULTURES WHEN MULTIPLE SETS ARE DRAWN IS UNCERTAIN. PLEASE NOTIFY THE MICROBIOLOGY DEPARTMENT WITHIN ONE WEEK IF SPECIATION AND SENSITIVITIES ARE REQUIRED. Performed at [facility], [place] [address], [place], [place] [address]    Report Status 10/12/2018 FINAL  Final  Blood Culture ID Panel (Reflexed)     Status: Abnormal   Collection Time: 10/09/18  8:54 AM  Result Value Ref Range Status   Enterococcus species NOT DETECTED NOT DETECTED Final   Listeria monocytogenes NOT DETECTED NOT DETECTED Final   Staphylococcus species DETECTED (A) NOT DETECTED Final    Comment: Methicillin (oxacillin) susceptible coagulase negative staphylococcus. Possible blood culture contaminant (unless isolated from more than one blood culture draw or clinical case suggests pathogenicity). No antibiotic treatment is indicated for blood  culture contaminants. CRITICAL RESULT CALLED TO, READ BACK BY AND VERIFIED WITH: Shelda Jakes PharmD 14:55 10/10/18 ([email or web address])    Staphylococcus aureus (BCID) NOT DETECTED NOT DETECTED Final   Methicillin resistance NOT DETECTED NOT DETECTED Final   Streptococcus species NOT DETECTED NOT DETECTED Final  Streptococcus agalactiae NOT DETECTED NOT DETECTED Final   Streptococcus  pneumoniae NOT DETECTED NOT DETECTED Final   Streptococcus pyogenes NOT DETECTED NOT DETECTED Final   Acinetobacter baumannii NOT DETECTED NOT DETECTED Final   Enterobacteriaceae species NOT DETECTED NOT DETECTED Final   Enterobacter cloacae complex NOT DETECTED NOT DETECTED Final   Escherichia coli NOT DETECTED NOT DETECTED Final   Klebsiella oxytoca NOT DETECTED NOT DETECTED Final   Klebsiella pneumoniae NOT DETECTED NOT DETECTED Final   Proteus species NOT DETECTED NOT DETECTED Final   Serratia marcescens NOT DETECTED NOT DETECTED Final   Haemophilus influenzae NOT DETECTED NOT DETECTED Final   Neisseria meningitidis NOT DETECTED NOT DETECTED Final   Pseudomonas aeruginosa NOT DETECTED NOT DETECTED Final   Candida albicans NOT DETECTED NOT DETECTED Final   Candida glabrata NOT DETECTED NOT DETECTED Final   Candida krusei NOT DETECTED NOT DETECTED Final   Candida parapsilosis NOT DETECTED NOT DETECTED Final   Candida tropicalis NOT DETECTED NOT DETECTED Final    Comment: Performed at [facility], [place] [address], [place], [place] [address]  Culture, Urine     Status: None   Collection Time: 10/13/18  9:52 AM  Result Value Ref Range Status   Specimen Description   Final    URINE, RANDOM Performed at [facility] [address], [place], [place] [address]    Special Requests   Final    NONE Performed at [facility], [place] [address] [place], [place] [address]    Culture   Final    NO GROWTH Performed at [facility], [place] [address] [place], [place] [address]    Report Status 10/14/2018 FINAL  Final  Culture, blood (routine x 2)     Status: None (Preliminary result)   Collection Time: 10/13/18 10:25 AM  Result Value Ref Range Status   Specimen Description   Final    BLOOD RIGHT ARM Performed at [facility] [address] [place], [place] [address]    Special Requests   Final    BOTTLES DRAWN AEROBIC  AND ANAEROBIC Blood Culture adequate volume Performed at [facility] [address] [place], [place] [address]    Culture   Final    NO GROWTH 4 DAYS Performed at [facility], [place] [address], [place], [place] [address]    Report Status PENDING  Incomplete  Culture, blood (routine x 2)     Status: None (Preliminary result)   Collection Time: 10/13/18 10:30 AM  Result Value Ref Range Status   Specimen Description   Final    BLOOD RIGHT HAND Performed at [facility] [address] [place], [place] [address]    Special Requests   Final    BOTTLES DRAWN AEROBIC ONLY Blood Culture adequate volume Performed at [facility] [address], [place], [place] [address]    Culture   Final    NO GROWTH 4 DAYS Performed at [facility], [place] [address] [place], [place] [address]    Report Status PENDING  Incomplete  MRSA PCR Screening     Status: None   Collection Time: 10/13/18  3:15 PM  Result Value Ref Range Status   MRSA by PCR NEGATIVE NEGATIVE Final    Comment:        The GeneXpert MRSA Assay (FDA approved for NASAL specimens only), is one component of a comprehensive MRSA colonization surveillance program.  It is not intended to diagnose MRSA infection nor to guide or monitor treatment for MRSA infections. Performed at [facility], [place] [address] [place], [place] [address]          Radiology Studies: No results found.      Scheduled Meds: . magic mouthwash w/lidocaine  10 mL Oral QID  . mouth rinse  15 mL Mouth Rinse BID  . phenytoin (DILANTIN) IV  100 mg Intravenous Q8H   Continuous Infusions: . HYDROmorphone 0.3 mg/hr (10/16/18 1600)     LOS: 12 days    Time spent: over 30 min    Fayrene Helper, MD [facility] Pager see AMION for pager  If 7PM-7AM, please contact night-coverage [email or web address] Password [facility] 10/17/2018, 6:47 PM

## 2018-10-17 NOTE — Progress Notes (Signed)
Daily Progress Note   Patient Name: Sean Murray       Date: 10/17/2018 DOB: 1956/09/03  Age: 63 y.o. MRN#: [id number] Attending Physician: Elodia Florence., * Primary Care Physician: [name], MD Admit Date: 10/27/2018  Reason for Consultation/Follow-up: Establishing goals of care  Subjective: Chart reviewed including review of prn medications.  Sean Murray is unresponsive today with periods of apnea.  Family, including sister/HCPOA [name]) at the bedside.  States that he has been comfortable since initiation of continuous infusion.  No concerns per family today.  Length of Stay: 12  Current Medications: Scheduled Meds:  . magic mouthwash w/lidocaine  10 mL Oral QID  . mouth rinse  15 mL Mouth Rinse BID  . phenytoin (DILANTIN) IV  100 mg Intravenous Q8H    Continuous Infusions: . HYDROmorphone 0.3 mg/hr (10/16/18 1600)    PRN Meds: acetaminophen **OR** acetaminophen, antiseptic oral rinse, bisacodyl, diphenhydrAMINE, glycopyrrolate **OR** glycopyrrolate **OR** glycopyrrolate, haloperidol **OR** haloperidol **OR** haloperidol lactate, HYDROmorphone, HYDROmorphone (DILAUDID) injection, LORazepam **OR** LORazepam **OR** LORazepam, ondansetron **OR** ondansetron (ZOFRAN) IV, oxymetazoline, polyvinyl alcohol  Physical Exam         Does not participate in encounter.  Actively dying. Diminished with periods of apnea noted. Abdomen is not distended Increasing edema S 1 S 2  Has low BP  Vital Signs: BP (!) 95/55 (BP Location: Right Arm) Comment: Nurse Notified  Pulse (!) 116   Temp 100 F (37.8 C) (Oral)   Resp 16   Ht _0  (1.575 m)   Wt 52 kg   SpO2 (!) 68%   BMI 20.97 kg/m  SpO2: SpO2: (!) 68 % O2 Device: O2 Device: Room Air O2 Flow Rate: O2 Flow Rate (L/min): 2  L/min  Intake/output summary:   Intake/Output Summary (Last 24 hours) at 10/17/2018 1416 Last data filed at 10/17/2018 1000 Gross per 24 hour  Intake 12.13 ml  Output 950 ml  Net -937.87 ml   LBM: Last BM Date: 10/10/18 Baseline Weight: Weight: 52 kg Most recent weight: Weight: 52 kg      PPS 10% Palliative Assessment/Data:    Flowsheet Rows     Most Recent Value  Intake Tab  Referral Department  Hospitalist  Unit at Time of Referral  ER  Date Notified  10/06/18  Palliative Care Type  New Palliative  care  Reason for referral  Clarify Goals of Care  Date of Admission  10/16/2018  Date first seen by Palliative Care  10/06/18  # of days Palliative referral response time  0 Day(s)  # of days IP prior to Palliative referral  1  Clinical Assessment  Psychosocial & Spiritual Assessment  Palliative Care Outcomes      Patient Active Problem List   Diagnosis Date Noted  . Multifocal pneumonia   . Fever   . Metabolic encephalopathy   . Pressure injury of skin 10/06/2018  . DNR (do not resuscitate)   . Dementia associated with Sean underlying disease without behavioral disturbance ([place])   . Palliative care encounter   . Acute renal failure (ARF) ([place]) 10/21/2018  . Dehydration 10/06/2018  . QT prolongation 10/13/2018  . Hypernatremia 10/20/2018  . Down's syndrome 04/13/2017  . Memory disorder 04/13/2017  . HOH (hard of hearing) 04/13/2017  . Generalized abdominal pain 12/23/2016  . Abnormal findings-gastrointestinal tract 12/23/2016    Palliative Care Assessment & Plan   Patient Profile:    Assessment:  63 yo gentleman from group home, has MR, admitted with renal failure, generalized weakness, poor PO intake, oral ulcers.  PMT following for symptom management and goals of care discussions.    Recommendations/Plan: - Sean Murray is actively dying.  Plan moving forward is for full comfort care. - Met again with family today and reviewed questions regarding care plan and  anticipatory guidance moving forward.  - Pain/SOB:  Continue dilaudid 0.62m/hr with additional 0.532mbolus every 30 minutes as needed. - anxiety: ativan as needed -agitation: haldol as needed -secretions: robinul as needed -seizures: continue dilantin  Code Status:    Code Status Orders  (From admission, onward)         Start     Ordered   10/06/18 1446  Do not attempt resuscitation (DNR)  Continuous    Question Answer Comment  In the event of cardiac or respiratory ARREST Do not call a "code blue"   In the event of cardiac or respiratory ARREST Do not perform Intubation, CPR, defibrillation or ACLS   In the event of cardiac or respiratory ARREST Use medication by any route, position, wound care, and Sean measures to relive pain and suffering. May use oxygen, suction and manual treatment of airway obstruction as needed for comfort.   Comments Per Guardian ElFenton Malling DNR but full scope treatment otherwise.      10/06/18 1446        Code Status History    Date Active Date Inactive Code Status Order ID Comments User Context   10/03/2018 2055 10/06/2018 1446 Full Code [id number] BakerMD Inpatient       Prognosis:   Hours - Days  Discharge Planning:  Anticipate hospital death  Care plan was discussed with Dr. PoFlorene Glenhank you for allowing the Palliative Medicine Team to assist in the care of this patient.   Total Time 20 Prolonged Time Billed  no       Greater than 50%  of this time was spent counseling and coordinating care related to the above assessment and plan.  GeMicheline RoughMD  Please contact Palliative Medicine Team phone at [phone] questions and concerns.

## 2018-10-18 LAB — CULTURE, BLOOD (ROUTINE X 2)
Culture: NO GROWTH
Culture: NO GROWTH
Special Requests: ADEQUATE
Special Requests: ADEQUATE

## 2018-10-28 ENCOUNTER — Ambulatory Visit: Payer: Medicare Other | Admitting: Podiatry

## 2018-10-29 NOTE — Progress Notes (Signed)
Daily Progress Note   Patient Name: Sean Murray       Date: 2018-10-24 DOB: 08-Jan-1956  Age: 63 y.o. MRN#: [id number] Attending Physician: Elodia Florence., * Primary Care Physician: [name], MD Admit Date: 10/12/2018  Reason for Consultation/Follow-up: Establishing goals of care  Subjective: Chart reviewed including review of prn medications.  Sean Murray was unresponsive today with periods of apnea.  Family, including sister/HCPOA [name]) at the bedside.  States that he has been comfortable since initiation of continuous infusion.     Length of Stay: 13  Current Medications: Scheduled Meds:  . magic mouthwash w/lidocaine  10 mL Oral QID  . mouth rinse  15 mL Mouth Rinse BID  . phenytoin (DILANTIN) IV  100 mg Intravenous Q8H    Continuous Infusions: . HYDROmorphone 0.3 mg/hr (24-Oct-2018 0600)    PRN Meds: acetaminophen **OR** acetaminophen, antiseptic oral rinse, bisacodyl, diphenhydrAMINE, glycopyrrolate **OR** glycopyrrolate **OR** glycopyrrolate, haloperidol **OR** haloperidol **OR** haloperidol lactate, HYDROmorphone, HYDROmorphone (DILAUDID) injection, LORazepam **OR** LORazepam **OR** LORazepam, ondansetron **OR** ondansetron (ZOFRAN) IV, oxymetazoline, polyvinyl alcohol  Physical Exam         Does not participate in encounter.  Actively dying. Diminished with periods of apnea noted. Abdomen is not distended Increasing edema S 1 S 2  Has low BP  Vital Signs: BP 100/60 (BP Location: Right Arm)   Pulse (!) 116   Temp (!) 101.1 F (38.4 C) (Oral) Comment: Nurse Notified  Resp 17   Ht 5' 2" (1.575 m)   Wt 52 kg   SpO2 (!) 68%   BMI 20.97 kg/m  SpO2: SpO2: (!) 68 % O2 Device: O2 Device: Room Air O2 Flow Rate: O2 Flow Rate (L/min): 2 L/min  Intake/output  summary:   Intake/Output Summary (Last 24 hours) at 2018-10-24 1528 Last data filed at 24-Oct-2018 0600 Gross per 24 hour  Intake 9.6 ml  Output 100 ml  Net -90.4 ml   LBM: Last BM Date: 10/10/18 Baseline Weight: Weight: 52 kg Most recent weight: Weight: 52 kg      PPS 10% Palliative Assessment/Data:    Flowsheet Rows     Most Recent Value  Intake Tab  Referral Department  Hospitalist  Unit at Time of Referral  ER  Date Notified  10/06/18  Palliative Care Type  New Palliative care  Reason  for referral  Clarify Goals of Care  Date of Admission  09/29/2018  Date first seen by Palliative Care  10/06/18  # of days Palliative referral response time  0 Day(s)  # of days IP prior to Palliative referral  1  Clinical Assessment  Psychosocial & Spiritual Assessment  Palliative Care Outcomes      Patient Active Problem List   Diagnosis Date Noted  . Multifocal pneumonia   . Fever   . Metabolic encephalopathy   . Pressure injury of skin 10/06/2018  . DNR (do not resuscitate)   . Dementia associated with other underlying disease without behavioral disturbance ([place])   . Palliative care encounter   . Acute renal failure (ARF) ([place]) 10/04/2018  . Dehydration 10/08/2018  . QT prolongation 10/27/2018  . Hypernatremia 10/22/2018  . Down's syndrome 04/13/2017  . Memory disorder 04/13/2017  . HOH (hard of hearing) 04/13/2017  . Generalized abdominal pain 12/23/2016  . Abnormal findings-gastrointestinal tract 12/23/2016    Palliative Care Assessment & Plan   Patient Profile:    Assessment:  63 yo gentleman from group home, has MR, admitted with renal failure, generalized weakness, poor PO intake, oral ulcers.  PMT following for symptom management and goals of care discussions.    Recommendations/Plan: - Sean Murray is actively dying.  Plan  is for full comfort care. - Met again with family today and we discussed about terminal fever and lack of thirst/hunger at end of life.   -  Pain/SOB:  Continue dilaudid 0.38m/hr with additional 0.559mbolus every 30 minutes as needed. - anxiety: ativan as needed -agitation: haldol as needed -secretions: robinul as needed -seizures: continue dilantin  Code Status:    Code Status Orders  (From admission, onward)         Start     Ordered   10/06/18 1446  Do not attempt resuscitation (DNR)  Continuous    Question Answer Comment  In the event of cardiac or respiratory ARREST Do not call a "code blue"   In the event of cardiac or respiratory ARREST Do not perform Intubation, CPR, defibrillation or ACLS   In the event of cardiac or respiratory ARREST Use medication by any route, position, wound care, and other measures to relive pain and suffering. May use oxygen, suction and manual treatment of airway obstruction as needed for comfort.   Comments Per Guardian ElFenton Malling DNR but full scope treatment otherwise.      10/06/18 1446        Code Status History    Date Active Date Inactive Code Status Order ID Comments User Context   10/21/2018 2055 10/06/2018 1446 Full Code [id number] [email or web address] Inpatient       Prognosis:   Hours - Days  Discharge Planning:  Anticipate hospital death  Care plan was discussed with family l Thank you for allowing the Palliative Medicine Team to assist in the care of this patient.  9 to 9.35 am.  Total Time 35 Prolonged Time Billed  no       Greater than 50%  of this time was spent counseling and coordinating care related to the above assessment and plan.  ZeLoistine ChanceMD [phone] contact Palliative Medicine Team phone at [phone] questions and concerns.   Addendum: Chart reviewed, it is now noted that the patient expired peacefully this afternoon.

## 2018-10-29 NOTE — Progress Notes (Signed)
Family called nurse to room. Pt took long breath, then no breaths detected. 2 RN's, Alphonzo Lemmings and Manus Gunning, assessed pt. No radial nor apical pulse noted. No breaths observed or chest movement noted. Pt death pronounced. MD notified. Family reassured and comforted.

## 2018-10-29 NOTE — Progress Notes (Signed)
NT transported pt via covered stretcher to morgue for pick up. Expecting [place] services.

## 2018-10-29 NOTE — Death Summary Note (Signed)
DEATH SUMMARY   Patient Details  Name: Sean Murray MRN: [id number] DOB: March 20, 1956  Admission/Discharge Information   Admit Date:  01-Nov-2018  Date of Death: Date of Death: 2018-11-14  Time of Death: Time of Death: 59  Length of Stay: 05-Jan-2023  Referring Physician: [name], MD   Reason(s) for Hospitalization  See below  Diagnoses  Preliminary cause of death:  Secondary Diagnoses (including complications and co-morbidities):  Active Problems:   Down's syndrome   Acute renal failure (ARF) (HCC)   Dehydration   QT prolongation   Hypernatremia   Pressure injury of skin   DNR (do not resuscitate)   Dementia associated with other underlying disease without behavioral disturbance [facility])   Palliative care encounter   Metabolic encephalopathy   Fever   Multifocal pneumonia   Brief [facility] Course (including significant findings, care, treatment, and services provided and events leading to death)  63 year old male with a past medical history of mental retardation who lives in a group home who is wheelchair-bound at baseline. Her legal guardian who is apparently his sister who lives in [place]. Patient was brought into the [facility] due to decreased oral intake for the past 3 days. Apparently patient has dementia at baseline and has been progressively getting worse over the past many months.  He was admitted with acute kidney injury and hypernatremia and was found to have an acute DVT and pulmonary embolism.  He was started on heparin.  He developed a fever on 1/12 and was started on ancef for cellulitis/thrombophlebitis.  He was noted to have multifocal pneumonia on 1/16 and his antibiotics were broadened.  He gradually declined and decision was made on discussion to make Sean [facility] comfortable.  See previous progress notes for additional details.   Pertinent Labs and Studies  Significant Diagnostic Studies Dg Abd 1 View  Result Date: 10/13/2018 CLINICAL DATA:  Abdominal  pain EXAM: ABDOMEN - 1 VIEW COMPARISON:  01-Nov-2018 FINDINGS: Scattered large and small bowel gas is noted. Gallstone is again seen. No findings of constipation or obstruction are noted. Degenerative change of the lumbar spine is again seen. IMPRESSION: Chronic changes without acute abnormality. Electronically Signed   By: Inez Catalina M.D.   On: 10/13/2018 10:32   Dg Abdomen 1 View  Result Date: 01-Nov-2018 CLINICAL DATA:  Constipation. EXAM: ABDOMEN - 1 VIEW COMPARISON:  CT 07/08/2016 FINDINGS: Air-filled normal caliber small bowel in the central and left abdomen. No evidence of obstruction or free air. Moderate stool in the ascending colon. Moderate rectosigmoid stool with mild rectal distension of 7.3 cm. Pelvic calcifications consistent with phleboliths. Right upper quadrant calcification likely corresponds to gallstone that was seen on prior CT. Lung bases are clear. Scoliotic curvature in degenerative change in the spine. IMPRESSION: 1. No evidence of obstruction or free air. 2. Stool in the rectosigmoid colon with mild rectal distension but no increased stool burden to suggest constipation. 3. Gallstone on prior CT tentatively identified. Electronically Signed   By: Keith Rake M.D.   On: 2018-11-01 19:54   Ct Head Wo Contrast  Result Date: 11-01-18 CLINICAL DATA:  Decreased appetite EXAM: CT HEAD WITHOUT CONTRAST TECHNIQUE: Contiguous axial images were obtained from the base of the skull through the vertex without intravenous contrast. COMPARISON:  CT 07/18/2018 FINDINGS: Brain: Motion degraded study. No acute territorial infarction, hemorrhage or intracranial mass. Stable atrophy. Stable enlarged ventricles. Periventricular white matter hypodensity. Vascular: No hyperdense vessels.  Carotid vascular calcification. Skull: Normal. Negative for fracture or focal lesion.  Sinuses/Orbits: No acute finding. Other: None IMPRESSION: 1. Motion degradation limits the exam 2. No definite CT evidence for  acute intracranial abnormality 3. Similar appearance of enlarged ventricular system, atrophy, and mild hypodensity within the bilateral white matter. Electronically Signed   By: Donavan Foil M.D.   On: 10/28/2018 16:53   Ct Angio Chest Pe W Or Wo Contrast  Result Date: 10/06/2018 CLINICAL DATA:  Left lower extremity DVT on recent vascular imaging EXAM: CT ANGIOGRAPHY CHEST WITH CONTRAST TECHNIQUE: Multidetector CT imaging of the chest was performed using the standard protocol during bolus administration of intravenous contrast. Multiplanar CT image reconstructions and MIPs were obtained to evaluate the vascular anatomy. CONTRAST:  129mL ISOVUE-370 IOPAMIDOL (ISOVUE-370) INJECTION 76% COMPARISON:  Plain film from 10/28/2018 FINDINGS: Cardiovascular: Thoracic aorta shows no evidence of aneurysmal dilatation or dissection. Cardiac shadow is at the upper limits of normal in size. Pulmonary artery is well visualize with a normal branching pattern. Filling defects are noted within the right lower lobe pulmonary arterial branches consistent with pulmonary emboli. No definitive left-sided pulmonary emboli is noted. No evidence of right heart strain is noted. Mediastinum/Nodes: The esophagus is within normal limits. No sizable hilar or mediastinal adenopathy is noted. The thoracic inlet is within normal limits. Lungs/Pleura: The lungs are well aerated bilaterally without focal infiltrate or sizable effusion. No pneumothorax is seen. Upper Abdomen: Visualized upper abdomen shows no acute abnormality. Musculoskeletal: Degenerative changes of the thoracic spine are noted. Review of the MIP images confirms the above findings. IMPRESSION: Right lower lobe pulmonary emboli without evidence of right heart strain. This corresponds with the known left lower extremity deep venous thrombosis. No other focal abnormality is seen. These results will be called to the ordering clinician or representative by the Radiologist Assistant,  and communication documented in the PACS or zVision Dashboard. Electronically Signed   By: Inez Catalina M.D.   On: 10/06/2018 16:58   Dg Chest Port 1 View  Result Date: 10/13/2018 CLINICAL DATA:  Fever EXAM: PORTABLE CHEST 1 VIEW COMPARISON:  10/24/2018 FINDINGS: Heart is normal size. There are bilateral perihilar and lower lobe airspace opacities with small effusions. No acute bony abnormality. IMPRESSION: Bilateral mid and lower lung airspace opacities with small effusions. Given the normal heart size, findings are suspicious for multifocal pneumonia. Electronically Signed   By: Rolm Baptise M.D.   On: 10/13/2018 10:30   Dg Chest Port 1 View  Result Date: 10/15/2018 CLINICAL DATA:  Reduced appetite EXAM: PORTABLE CHEST 1 VIEW COMPARISON:  01/29/2017 FINDINGS: The patient is rotated to the right on today's radiograph, reducing diagnostic sensitivity and specificity. Atherosclerotic calcification of the aortic arch. Mild thoracic spondylosis. Accounting for the rotated chest, the lungs appear clear. Old right rib deformities compatible with prior fractures. IMPRESSION: 1.  No active cardiopulmonary disease is radiographically apparent. 2.  Aortic Atherosclerosis (ICD10-I70.0). 3. Old right rib fractures. Electronically Signed   By: Van Clines M.D.   On: 10/04/2018 18:12   Dg Foot Complete Left  Addendum Date: 09/22/2018   ADDENDUM REPORT: 09/22/2018 14:05 ADDENDUM: Images actually were of the left foot. Electronically Signed   By: Lowella Grip III M.D.   On: 09/22/2018 14:05   Result Date: 09/22/2018 : CLINICAL DATA: Redness and swelling EXAM: RIGHT FOOT COMPLETE - 3+ VIEW COMPARISON: None. FINDINGS: Frontal, oblique, and lateral views obtained. There is an acute fracture along the medial aspect of the distal portion of the fifth proximal phalanx. No other acute fracture is evident. There is  subluxation at the first, second, third, and fourth MTP joints with lateral subluxation of the  proximal phalanges with respect to the metatarsals. There is moderate osteoarthritic change in the first MTP joint. There are several linear metallic foreign bodies slightly volar to the first proximal phalanx. There is pes cavus. There is generalized soft tissue swelling. There is no bony destruction or erosion. Note that there is flexion of the second, third, fourth, and fifth PIP and DIP joints. IMPRESSION: 1. Fracture of the medial aspect of the distal portion of the fifth proximal phalanx. 2. Subluxation of the first, second, third, and fourth MTP joints with lateral displacement of the phalanges with respect to the metatarsals at these levels. 3. Several linear metallic foreign bodies in the soft tissues volar to the first proximal phalanx. 4. Diffuse soft tissue swelling without bony destruction or erosion. No soft tissue air evident. 5. Flexion deformities of the second, third, fourth, and fifth PIP and DIP joints. 6. Extensive arthropathy first MTP joint. 7. Pes cavus. Electronically Signed: By: Lowella Grip III M.D. On: 09/22/2018 13:56   Dg Foot Complete Right  Addendum Date: 09/22/2018   ADDENDUM REPORT: 09/22/2018 13:25 ADDENDUM: Images actually were of the left foot. Electronically Signed   By: Lowella Grip III M.D.   On: 09/22/2018 13:25   Result Date: 09/22/2018 CLINICAL DATA:  Redness and swelling EXAM: RIGHT FOOT COMPLETE - 3+ VIEW COMPARISON:  None. FINDINGS: Frontal, oblique, and lateral views obtained. There is an acute fracture along the medial aspect of the distal portion of the fifth proximal phalanx. No other acute fracture is evident. There is subluxation at the first, second, third, and fourth MTP joints with lateral subluxation of the proximal phalanges with respect to the metatarsals. There is moderate osteoarthritic change in the first MTP joint. There are several linear metallic foreign bodies slightly volar to the first proximal phalanx. There is pes cavus. There is  generalized soft tissue swelling. There is no bony destruction or erosion. Note that there is flexion of the second, third, fourth, and fifth PIP and DIP joints. IMPRESSION: 1. Fracture of the medial aspect of the distal portion of the fifth proximal phalanx. 2. Subluxation of the first, second, third, and fourth MTP joints with lateral displacement of the phalanges with respect to the metatarsals at these levels. 3. Several linear metallic foreign bodies in the soft tissues volar to the first proximal phalanx. 4. Diffuse soft tissue swelling without bony destruction or erosion. No soft tissue air evident. 5. Flexion deformities of the second, third, fourth, and fifth PIP and DIP joints. 6.  Extensive arthropathy first MTP joint. 7.  Pes cavus. Electronically Signed: By: Lowella Grip III M.D. On: 09/20/2018 09:58   Vas [place] Lower Extremity Venous (dvt)  Result Date: 10/06/2018  Lower Venous Study Indications: Swelling.  Limitations: Body habitus and contracted state. Performing Technologist: Maudry Mayhew MHA, RDMS, RVT, RDCS  Examination Guidelines: A complete evaluation includes B-mode imaging, spectral Doppler, color Doppler, and power Doppler as needed of all accessible portions of each vessel. Bilateral testing is considered an integral part of a complete examination. Limited examinations for reoccurring indications may be performed as noted.  Right Venous Findings: +---+---------------+---------+-----------+----------+--------------+    CompressibilityPhasicitySpontaneityPropertiesSummary        +---+---------------+---------+-----------+----------+--------------+ CFV  Not visualized +---+---------------+---------+-----------+----------+--------------+  Left Venous Findings: +---------+---------------+---------+-----------+----------+--------------+          CompressibilityPhasicitySpontaneityPropertiesSummary         +---------+---------------+---------+-----------+----------+--------------+ CFV                                                   Not visualized +---------+---------------+---------+-----------+----------+--------------+ SFJ                                                   Not visualized +---------+---------------+---------+-----------+----------+--------------+ FV Prox  None                    No                   Acute          +---------+---------------+---------+-----------+----------+--------------+ FV Mid   None                                         Acute          +---------+---------------+---------+-----------+----------+--------------+ FV DistalNone                                         Acute          +---------+---------------+---------+-----------+----------+--------------+ PFV                                                   Not visualized +---------+---------------+---------+-----------+----------+--------------+ POP      None                    No                   Acute          +---------+---------------+---------+-----------+----------+--------------+ PTV      Full                    Yes                                 +---------+---------------+---------+-----------+----------+--------------+ PERO     Full                    Yes                                 +---------+---------------+---------+-----------+----------+--------------+ SSV      None                                         Acute          +---------+---------------+---------+-----------+----------+--------------+    Summary: Left: Findings consistent with acute deep vein thrombosis involving the left femoral  vein, and left popliteal vein. Findings consistent with acute superficial vein thrombosis involving the left small saphenous vein. No cystic structure found in the popliteal fossa.  *See table(s) above for measurements and observations. Electronically  signed by Quay Burow MD on 10/06/2018 at 6:07:23 PM.    Final    Vas [place] Upper Extremity Venous Duplex  Result Date: 10/10/2018 UPPER VENOUS STUDY  Indications: Swelling Performing Technologist: Oliver Hum RVT  Examination Guidelines: A complete evaluation includes B-mode imaging, spectral Doppler, color Doppler, and power Doppler as needed of all accessible portions of each vessel. Bilateral testing is considered an integral part of a complete examination. Limited examinations for reoccurring indications may be performed as noted.  Right Findings: +----------+------------+----------+---------+-----------+-------+ RIGHT     CompressiblePropertiesPhasicitySpontaneousSummary +----------+------------+----------+---------+-----------+-------+ IJV           Full                 Yes       Yes            +----------+------------+----------+---------+-----------+-------+ Subclavian    Full                 Yes       Yes            +----------+------------+----------+---------+-----------+-------+ Axillary      Full                 Yes       Yes            +----------+------------+----------+---------+-----------+-------+ Brachial      Full                 Yes       Yes            +----------+------------+----------+---------+-----------+-------+ Radial        Full                                          +----------+------------+----------+---------+-----------+-------+ Ulnar         Full                                          +----------+------------+----------+---------+-----------+-------+ Cephalic      Full                                          +----------+------------+----------+---------+-----------+-------+ Basilic       None                                   Acute  +----------+------------+----------+---------+-----------+-------+  Left Findings: +----------+------------+----------+---------+-----------+-------+ LEFT       CompressiblePropertiesPhasicitySpontaneousSummary +----------+------------+----------+---------+-----------+-------+ Subclavian    Full                 Yes       Yes            +----------+------------+----------+---------+-----------+-------+  Summary:  Right: No evidence of deep vein thrombosis in the upper extremity. Findings consistent with acute superficial vein thrombosis involving the right basilic vein.  Left: No evidence of thrombosis in the subclavian.  *See table(s) above for measurements and observations.  Diagnosing physician: Deitra Mayo MD Electronically signed by Deitra Mayo MD on 10/10/2018 at 10:42:53 AM.    Final     Microbiology Recent Results (from the past 240 hour(s))  Culture, blood (Routine X 2) w Reflex to ID Panel     Status: None   Collection Time: 10/09/18  8:54 AM  Result Value Ref Range Status   Specimen Description   Final    BLOOD RIGHT HAND Performed at [facility], [place] [address], [place], [place] [address]    Special Requests   Final    BOTTLES DRAWN AEROBIC ONLY Blood Culture adequate volume Performed at [facility] [address], [place], [place] [address]    Culture   Final    NO GROWTH 5 DAYS Performed at [facility], [place] [address] [place], [place] [address]    Report Status 10/14/2018 FINAL  Final  Culture, blood (Routine X 2) w Reflex to ID Panel     Status: Abnormal   Collection Time: 10/09/18  8:54 AM  Result Value Ref Range Status   Specimen Description BLOOD RIGHT ARM  Final   Special Requests   Final    BOTTLES DRAWN AEROBIC ONLY Blood Culture results may not be optimal due to an inadequate volume of blood received in culture bottles Performed at [facility], [place] [address] [place], [place] [address]    Culture  Setup Time   Final    GRAM POSITIVE COCCI AEROBIC BOTTLE ONLY Organism ID to follow CRITICAL RESULT CALLED TO, READ BACK BY AND  VERIFIED WITH: Shelda Jakes PharmD 14:55 10/10/18 ([email or web address])    Culture (A)  Final    STAPHYLOCOCCUS SPECIES (COAGULASE NEGATIVE) THE SIGNIFICANCE OF ISOLATING THIS ORGANISM FROM A SINGLE SET OF BLOOD CULTURES WHEN MULTIPLE SETS ARE DRAWN IS UNCERTAIN. PLEASE NOTIFY THE MICROBIOLOGY DEPARTMENT WITHIN ONE WEEK IF SPECIATION AND SENSITIVITIES ARE REQUIRED. Performed at [facility], [place] [address] [place],  [address]    Report Status 10/12/2018 FINAL  Final  Blood Culture ID Panel (Reflexed)     Status: Abnormal   Collection Time: 10/09/18  8:54 AM  Result Value Ref Range Status   Enterococcus species NOT DETECTED NOT DETECTED Final   Listeria monocytogenes NOT DETECTED NOT DETECTED Final   Staphylococcus species DETECTED (A) NOT DETECTED Final    Comment: Methicillin (oxacillin) susceptible coagulase negative staphylococcus. Possible blood culture contaminant (unless isolated from more than one blood culture draw or clinical case suggests pathogenicity). No antibiotic treatment is indicated for blood  culture contaminants. CRITICAL RESULT CALLED TO, READ BACK BY AND VERIFIED WITH: Shelda Jakes PharmD 14:55 10/10/18 ([email or web address])    Staphylococcus aureus (BCID) NOT DETECTED NOT DETECTED Final   Methicillin resistance NOT DETECTED NOT DETECTED Final   Streptococcus species NOT DETECTED NOT DETECTED Final   Streptococcus agalactiae NOT DETECTED NOT DETECTED Final   Streptococcus pneumoniae NOT DETECTED NOT DETECTED Final   Streptococcus pyogenes NOT DETECTED NOT DETECTED Final   Acinetobacter baumannii NOT DETECTED NOT DETECTED Final   Enterobacteriaceae species NOT DETECTED NOT DETECTED Final   Enterobacter cloacae complex NOT DETECTED NOT DETECTED Final   Escherichia coli NOT DETECTED NOT DETECTED Final   Klebsiella oxytoca NOT DETECTED NOT DETECTED Final   Klebsiella pneumoniae NOT DETECTED NOT DETECTED Final   Proteus species NOT DETECTED NOT DETECTED Final   Serratia marcescens NOT  DETECTED NOT DETECTED Final   Haemophilus influenzae NOT DETECTED NOT DETECTED Final  Neisseria meningitidis NOT DETECTED NOT DETECTED Final   Pseudomonas aeruginosa NOT DETECTED NOT DETECTED Final   Candida albicans NOT DETECTED NOT DETECTED Final   Candida glabrata NOT DETECTED NOT DETECTED Final   Candida krusei NOT DETECTED NOT DETECTED Final   Candida parapsilosis NOT DETECTED NOT DETECTED Final   Candida tropicalis NOT DETECTED NOT DETECTED Final    Comment: Performed at [facility], [place] [address], [place], [place] [address]  Culture, Urine     Status: None   Collection Time: 10/13/18  9:52 AM  Result Value Ref Range Status   Specimen Description   Final    URINE, RANDOM Performed at [facility] [address] [place], [place] [address]    Special Requests   Final    NONE Performed at [facility], [place] [address], [place], [place] [address]    Culture   Final    NO GROWTH Performed at [facility], [place] [address] [place], [place] [address]    Report Status 10/14/2018 FINAL  Final  Culture, blood (routine x 2)     Status: None   Collection Time: 10/13/18 10:25 AM  Result Value Ref Range Status   Specimen Description   Final    BLOOD RIGHT ARM Performed at [facility] [address] [place], [place] [address]    Special Requests   Final    BOTTLES DRAWN AEROBIC AND ANAEROBIC Blood Culture adequate volume Performed at [facility] [address] [place], [place] [address]    Culture   Final    NO GROWTH 5 DAYS Performed at [facility], [place] [address], [place], [place] [address]    Report Status 2018/10/24 FINAL  Final  Culture, blood (routine x 2)     Status: None   Collection Time: 10/13/18 10:30 AM  Result Value Ref Range Status   Specimen Description   Final    BLOOD RIGHT HAND Performed at [facility] [address] [place], [place]  [address]    Special Requests   Final    BOTTLES DRAWN AEROBIC ONLY Blood Culture adequate volume Performed at [facility] [address] [place], [place] [address]    Culture   Final    NO GROWTH 5 DAYS Performed at [facility], [place] [address] [place], [place] [address]    Report Status 2018-10-24 FINAL  Final  MRSA PCR Screening     Status: None   Collection Time: 10/13/18  3:15 PM  Result Value Ref Range Status   MRSA by PCR NEGATIVE NEGATIVE Final    Comment:        The GeneXpert MRSA Assay (FDA approved for NASAL specimens only), is one component of a comprehensive MRSA colonization surveillance program. It is not intended to diagnose MRSA infection nor to guide or monitor treatment for MRSA infections. Performed at [facility], [place] [address] [place],  [address]     Lab Basic Metabolic Panel: Recent Labs  Lab 10/12/18 0445 10/13/18 0755 10/14/18 0455  NA 136 136 133*  K 3.9 4.0 4.0  CL 104 103 103  CO2 23 23 22   GLUCOSE 107* 109* 102*  BUN 8 11 13   CREATININE 0.90 1.01 1.06  CALCIUM 7.1* 6.8* 6.8*  MG  --  1.8 1.8   Liver Function Tests: Recent Labs  Lab 10/13/18 0755 10/14/18 0455  AST 48* 47*  ALT 17 14  ALKPHOS 145* 113  BILITOT 0.3 0.7  PROT 4.8* 4.5*  ALBUMIN 1.4* 1.4*   No results for input(s): LIPASE, AMYLASE in the last 168 hours. No results for input(s): AMMONIA in the last 168 hours. CBC: Recent Labs  Lab 10/12/18 0445 10/12/18 1538 10/12/18 1836 10/13/18 0755 10/14/18 0455  WBC 10.4  --   --  12.8* 10.6*  HGB 7.9* 7.1* 7.7* 6.4* 6.6*  HCT 25.4* 23.4* 25.4* 20.8* 21.1*  MCV 105.4*  --   --  107.2* 100.5*  PLT 280  --   --  269 234   Cardiac Enzymes: No results for input(s): CKTOTAL, CKMB, CKMBINDEX, TROPONINI in the last 168 hours. Sepsis Labs: Recent Labs  Lab 10/12/18 0445 10/13/18 0755 10/14/18 0455  WBC 10.4 12.8* 10.6*    Procedures/Operations  See previous  progress notes   Fayrene Helper 11-03-18, 8:16 PM

## 2018-10-29 NOTE — Progress Notes (Signed)
Pt temperature was ranging from 101-103.1 tonight. Pt has PRN tylenol suppository ordered, however moving the pt to administer a suppository could be hard on the patient with his present condition. I paged the the on call MD to ask their opinion, but received no response. I explained the pros and cons to the family and they decided it would be best to not move the pt if possible. So we have been keeping ice packs and cold rags on his head, groin, and armpit areas to help control the fever.

## 2018-10-29 NOTE — Progress Notes (Signed)
Nutrition Brief Note  Chart reviewed. Pt now transitioning to comfort care.  No further nutrition interventions warranted at this time.  Please re-consult as needed.   Alcus Bradly RD, LDN Clinical Nutrition Pager # - [phone]    

## 2018-10-29 DEATH — deceased

## 2019-07-16 IMAGING — DX DG FOOT COMPLETE 3+V*L*
3 series · 3 of 3 positions shown · non-contrast
Comparison: None.

Addendum:
:
CLINICAL DATA: Redness and swelling

EXAM:
RIGHT FOOT COMPLETE - 3+ VIEW

[foot ap]
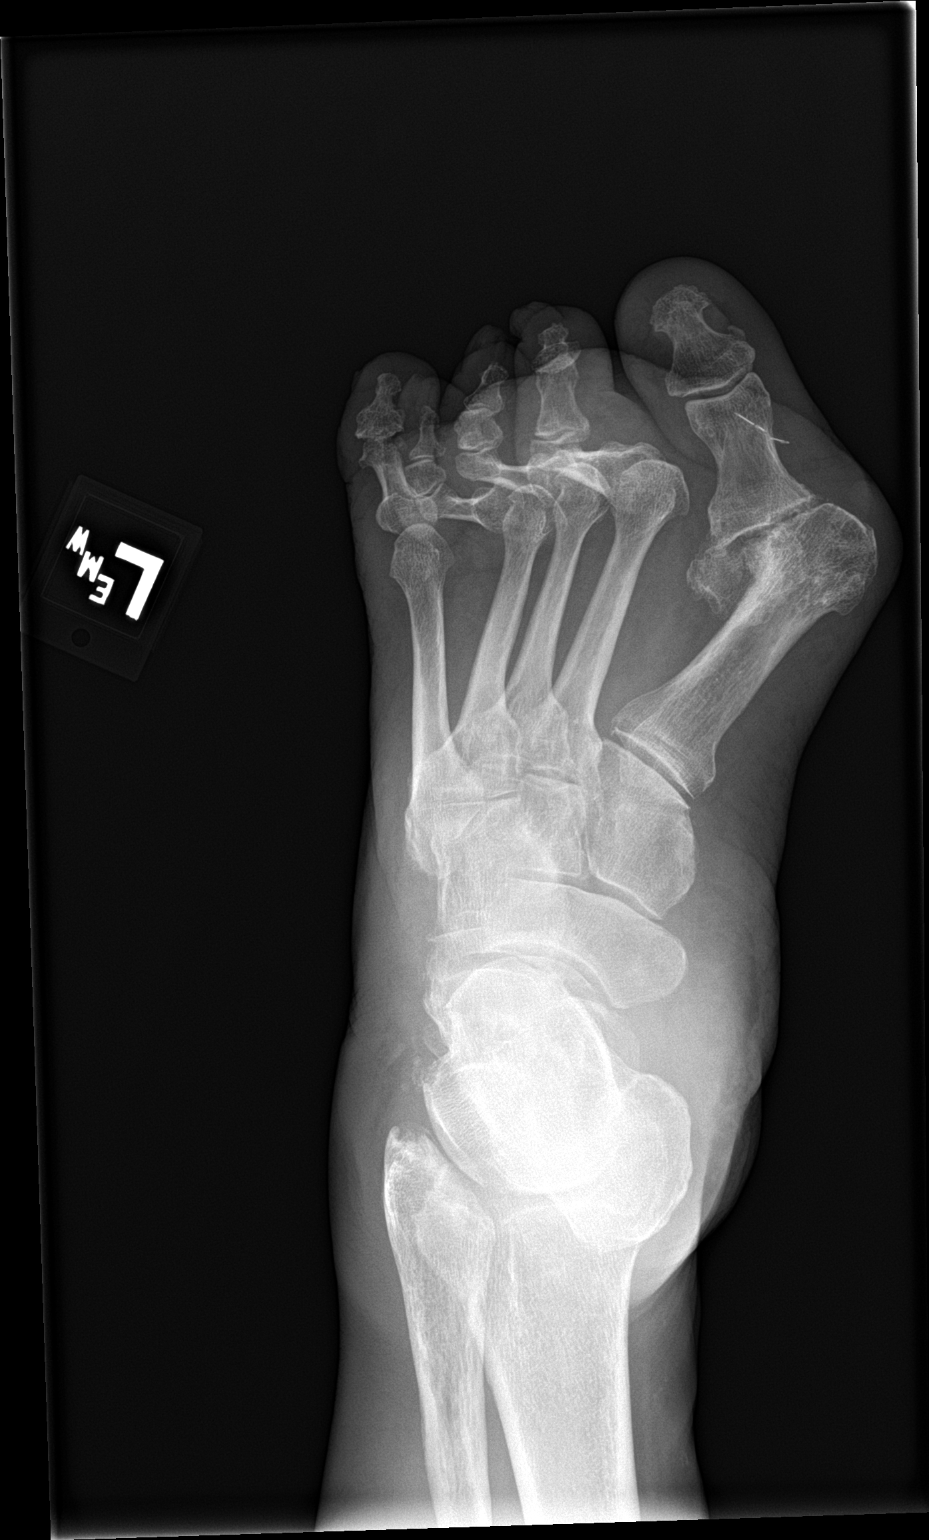

[foot obl]
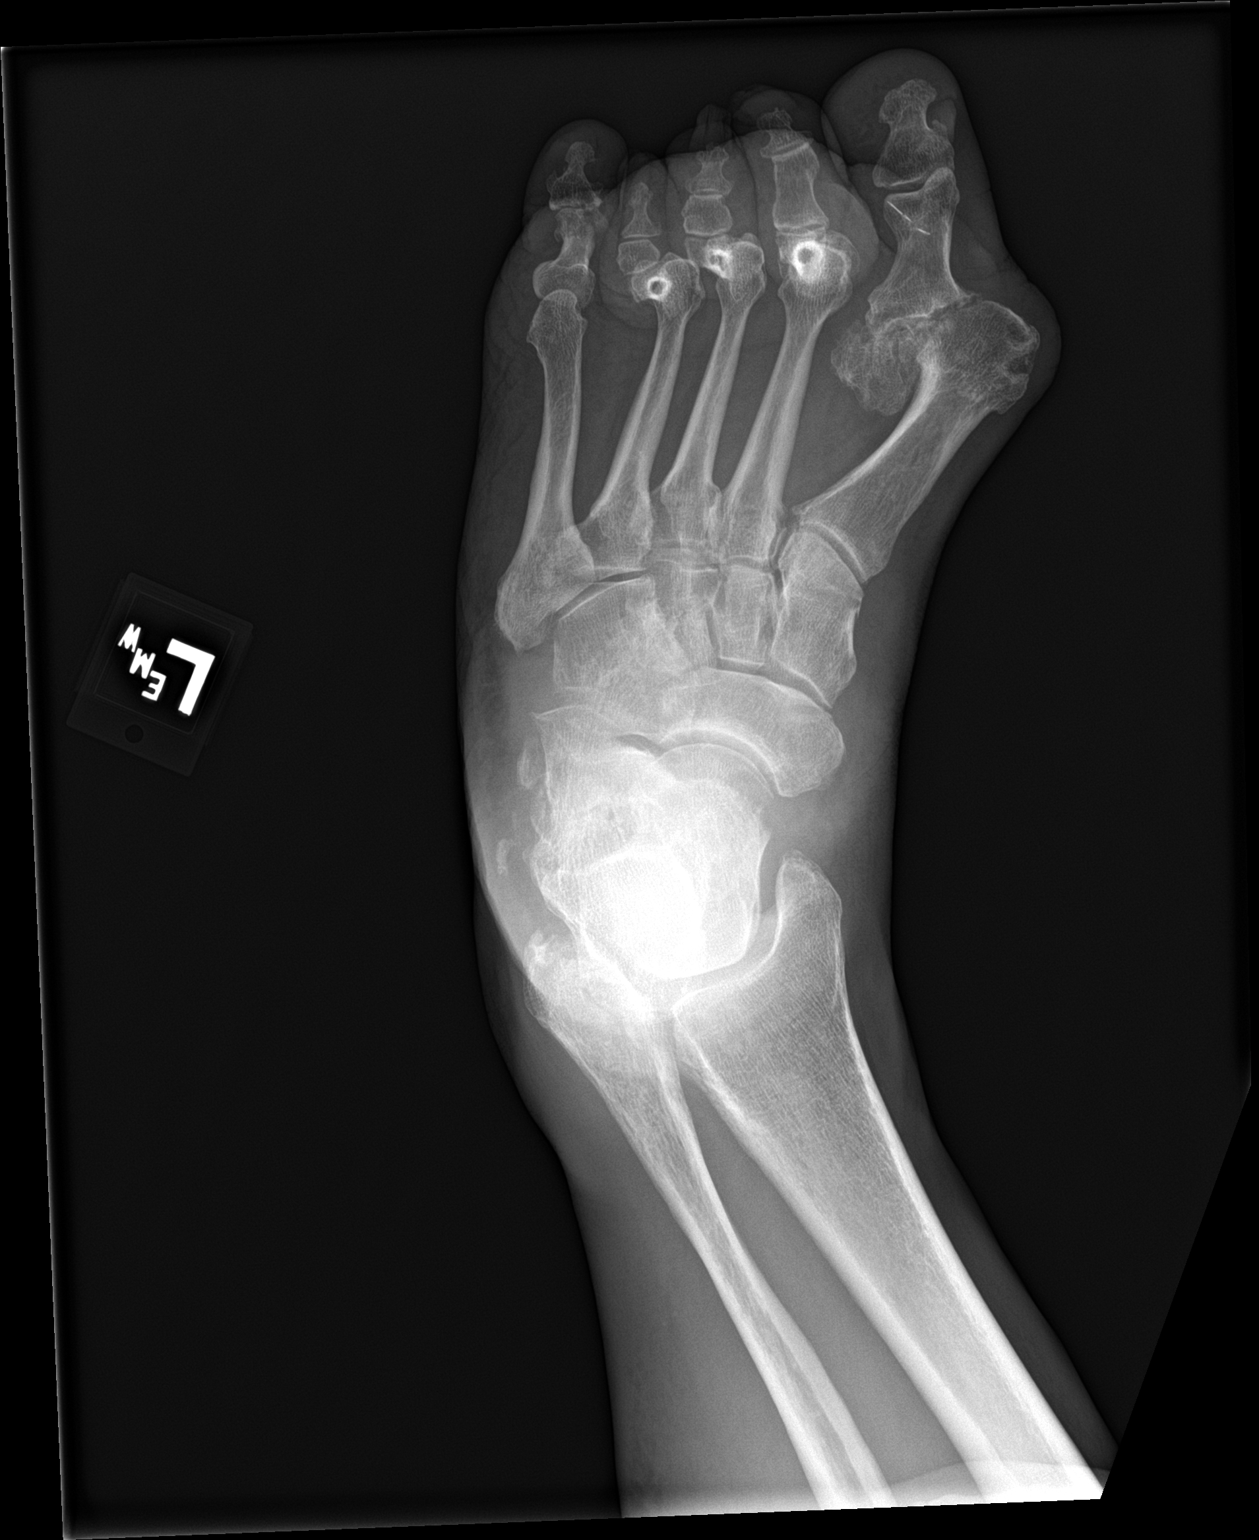

[foot lat]
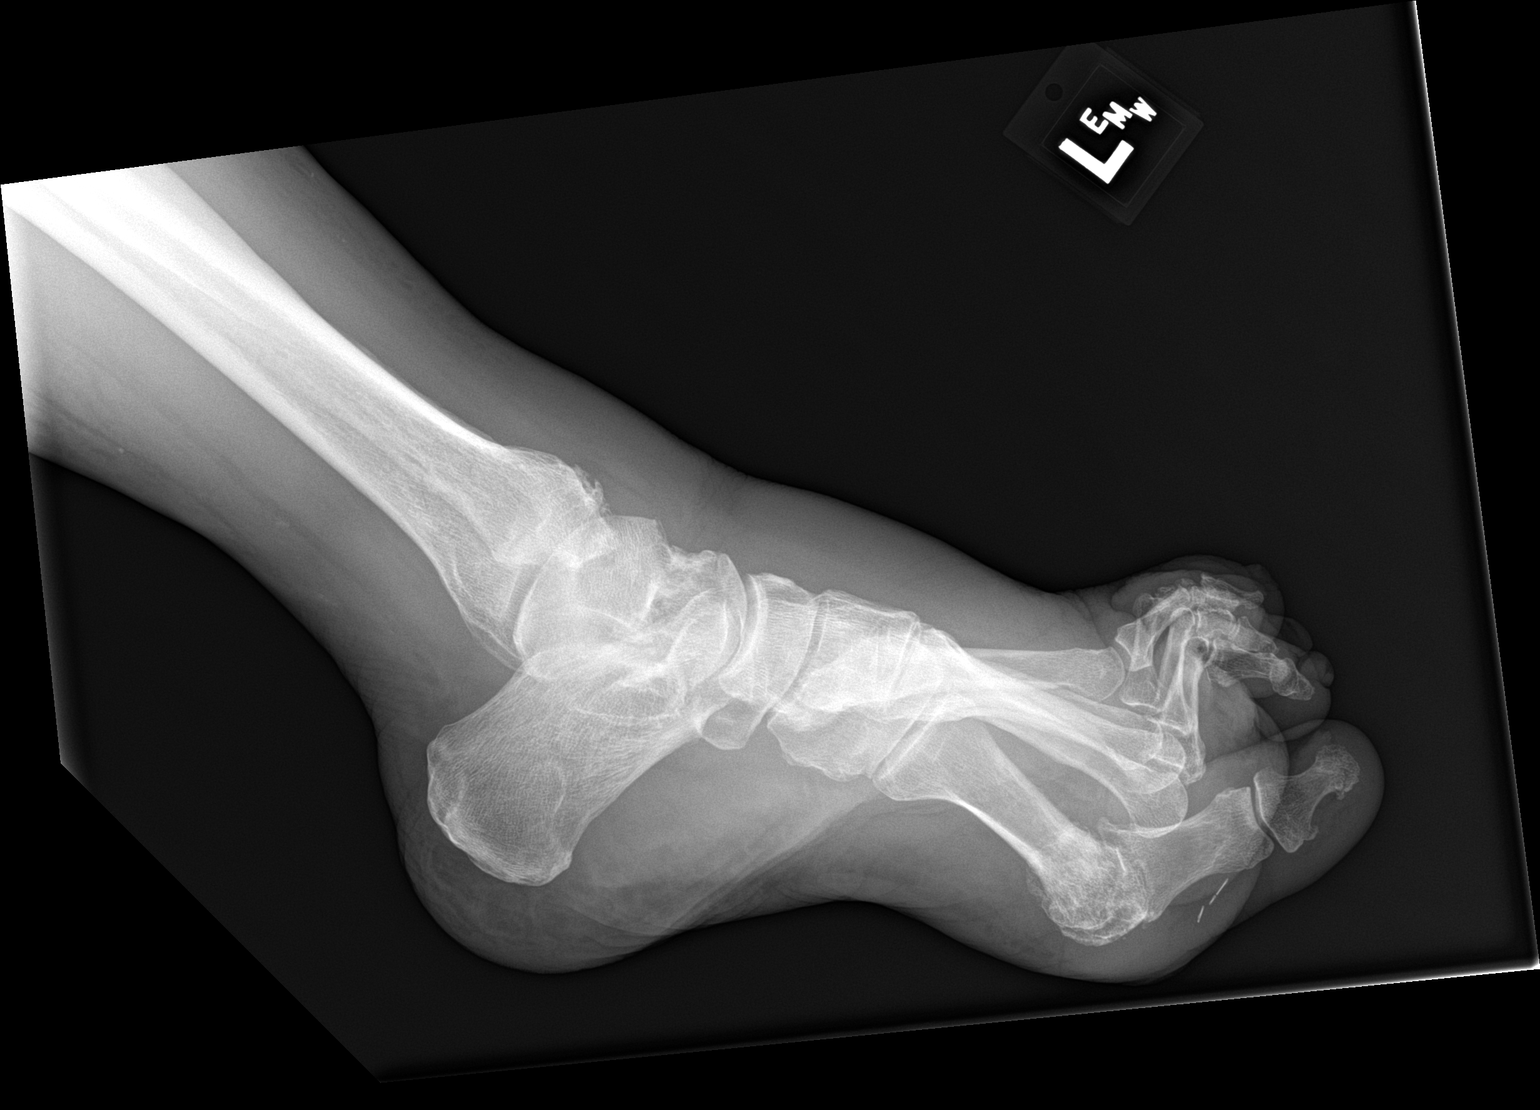

[3 of 3 positions shown; findings below may reference images not displayed]

FINDINGS: Frontal, oblique, and lateral views obtained. There is an acute
fracture along the medial aspect of the distal portion of the fifth
proximal phalanx. No other acute fracture is evident. There is
subluxation at the first, second, third, and fourth MTP joints with
lateral subluxation of the proximal phalanges with respect to the
metatarsals. There is moderate osteoarthritic change in the first
MTP joint. There are several linear metallic foreign bodies slightly
volar to the first proximal phalanx.

There is Siuntu Novikevic. There is generalized soft tissue swelling. There
is no bony destruction or erosion. Note that there is flexion of the
second, third, fourth, and fifth PIP and DIP joints.
IMPRESSION: 1. Fracture of the medial aspect of the distal portion of the fifth
proximal phalanx.

2. Subluxation of the first, second, third, and fourth MTP joints
with lateral displacement of the phalanges with respect to the
metatarsals at these levels.

3. Several linear metallic foreign bodies in the soft tissues volar
to the first proximal phalanx.

4. Diffuse soft tissue swelling without bony destruction or erosion.
No soft tissue air evident.

5. Flexion deformities of the second, third, fourth, and fifth PIP
and DIP joints.

6. Extensive arthropathy first MTP joint.

7. Mituhiro Desposorio.

ADDENDUM:
Images actually were of the left foot.

*** End of Addendum ***

## 2019-07-31 IMAGING — DX DG CHEST 1V PORT
1 series · 1 of 1 positions shown · non-contrast
Comparison: 01/29/2017

CLINICAL DATA: Reduced appetite

EXAM:
PORTABLE CHEST 1 VIEW

[chest ap]
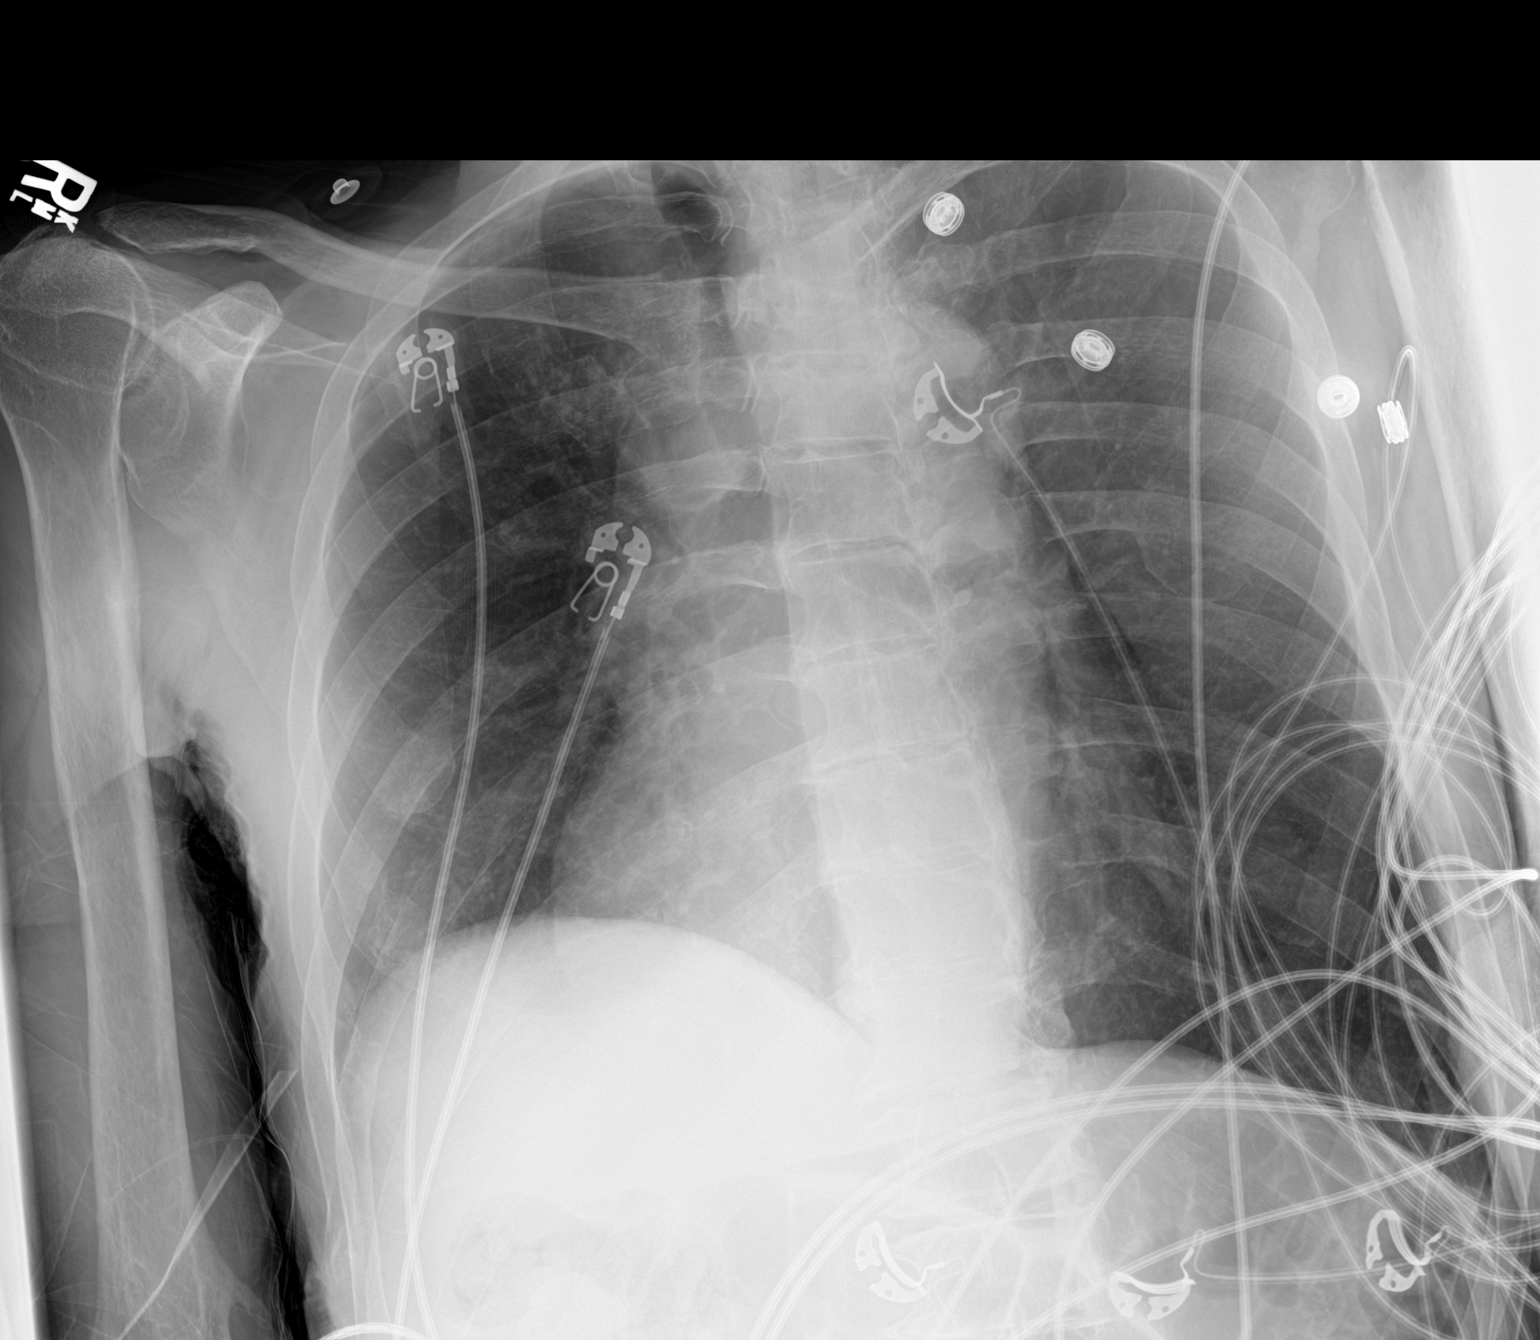

[1 of 1 positions shown; findings below may reference images not displayed]

FINDINGS: The patient is rotated to the right on today's radiograph, reducing
diagnostic sensitivity and specificity. Atherosclerotic
calcification of the aortic arch. Mild thoracic spondylosis.
Accounting for the rotated chest, the lungs appear clear. Old right
rib deformities compatible with prior fractures.
IMPRESSION: 1.  No active cardiopulmonary disease is radiographically apparent.
2.  Aortic Atherosclerosis ([id number]-F50.0).
3. Old right rib fractures.

## 2019-08-08 IMAGING — DX DG CHEST 1V PORT
1 series · 1 of 1 positions shown · non-contrast
Comparison: 10/05/2018

CLINICAL DATA: Fever

EXAM:
PORTABLE CHEST 1 VIEW

[chest ap]
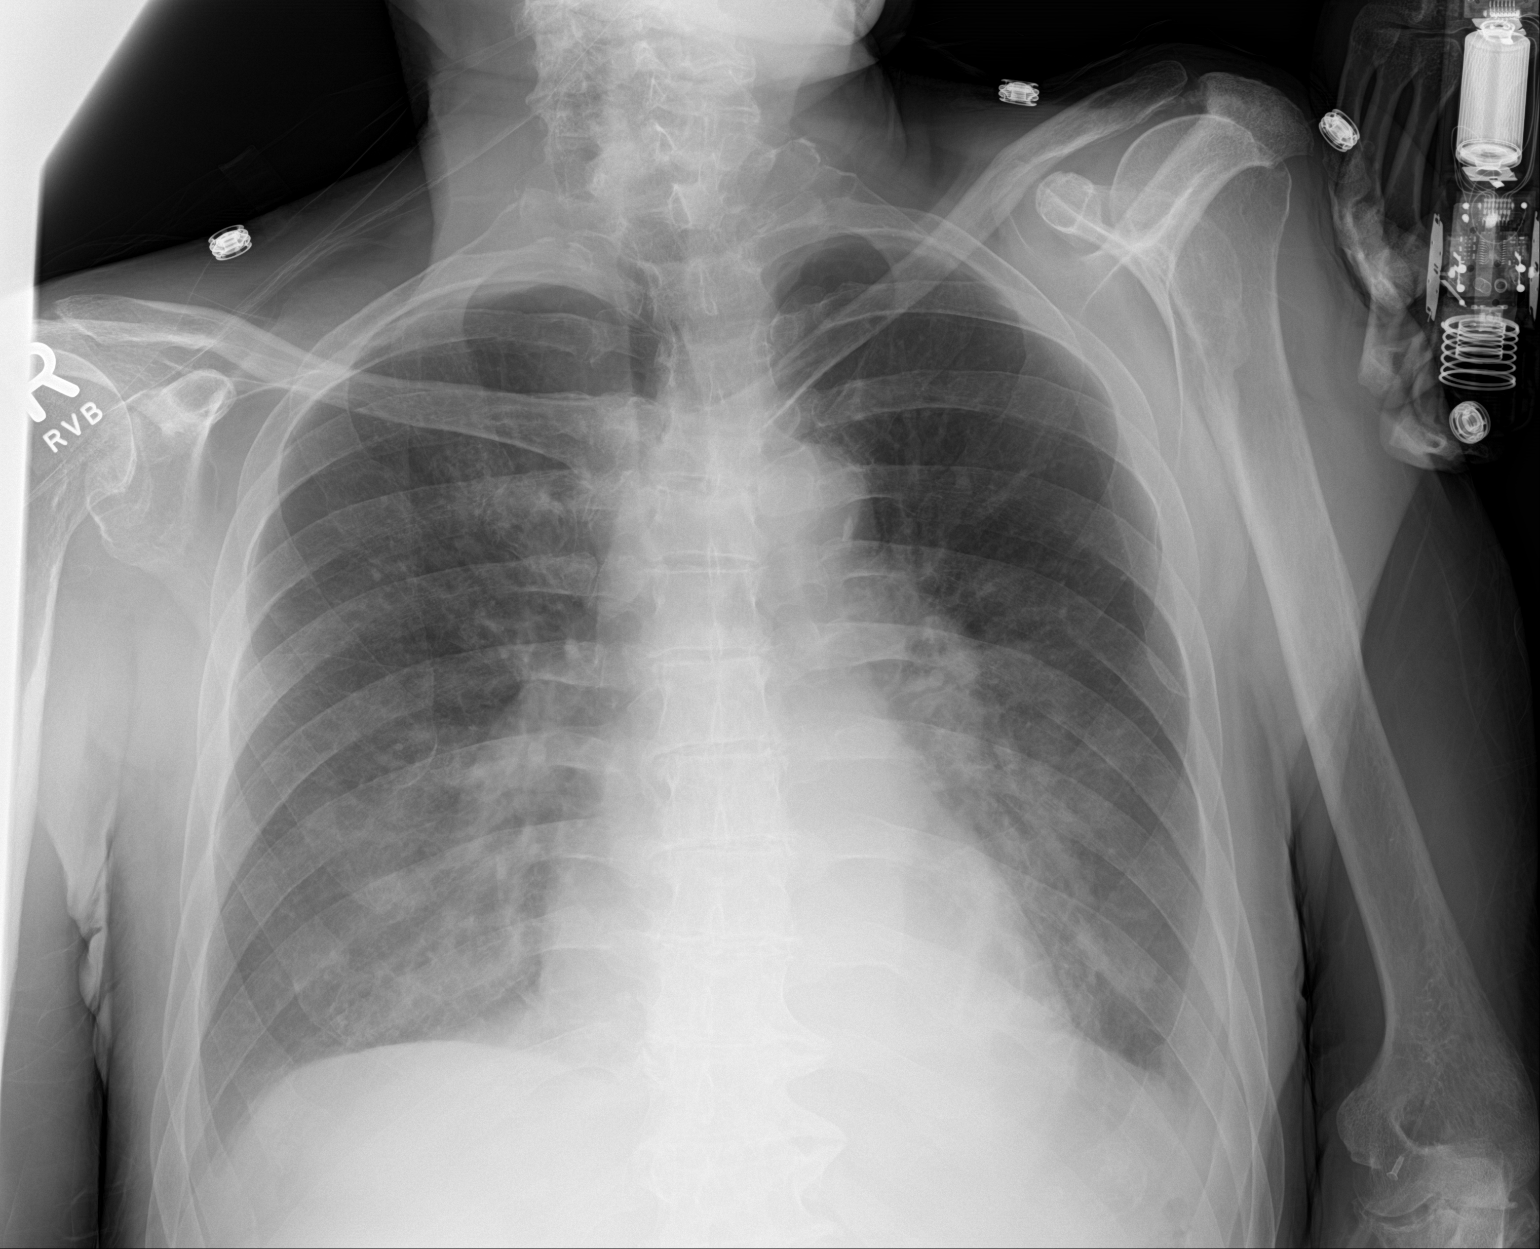

[1 of 1 positions shown; findings below may reference images not displayed]

FINDINGS: Heart is normal size. There are bilateral perihilar and lower lobe
airspace opacities with small effusions. No acute bony abnormality.
IMPRESSION: Bilateral mid and lower lung airspace opacities with small
effusions. Given the normal heart size, findings are suspicious for
multifocal pneumonia.

## 2019-08-08 IMAGING — DX DG ABDOMEN 1V
2 series · 2 of 2 positions shown · non-contrast
Comparison: 10/05/2018

CLINICAL DATA: Abdominal pain

EXAM:
ABDOMEN - 1 VIEW

[abdomen kub (1 of 2)]
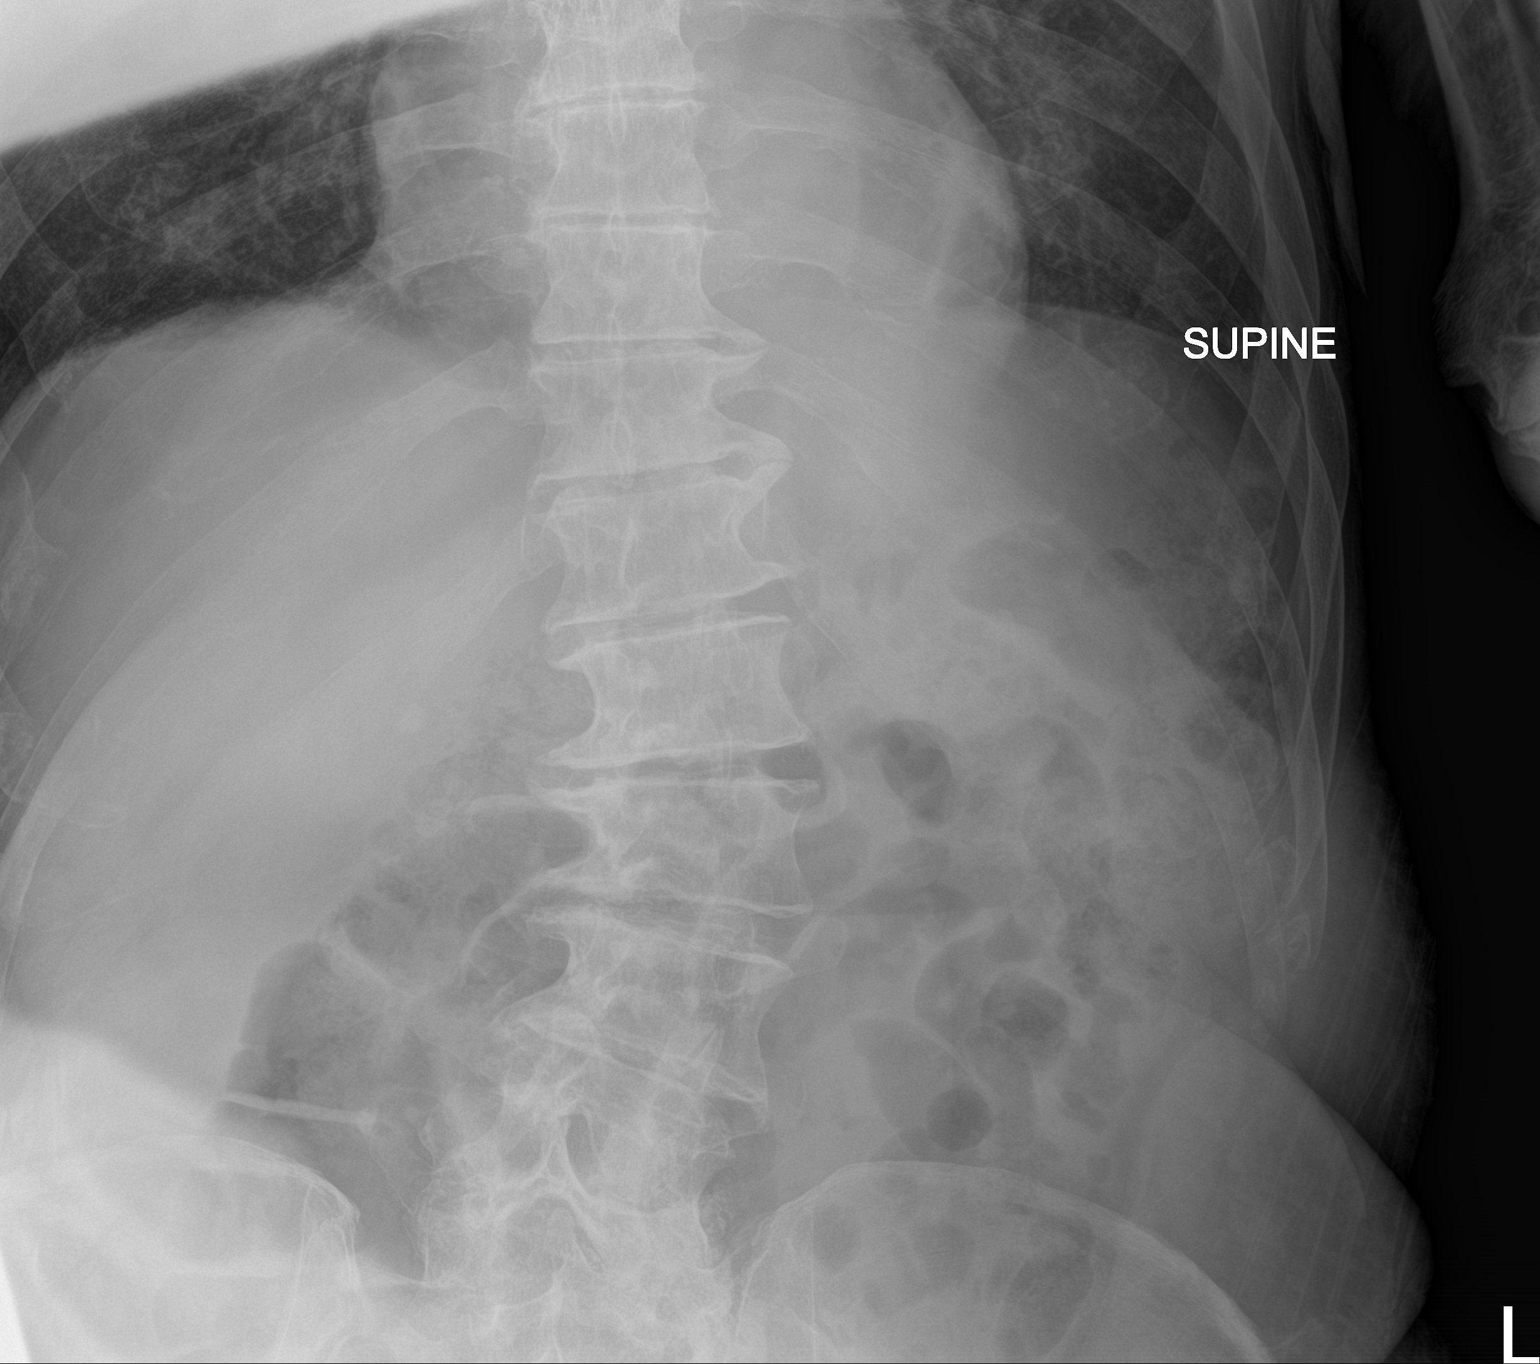

[abdomen kub (2 of 2)]
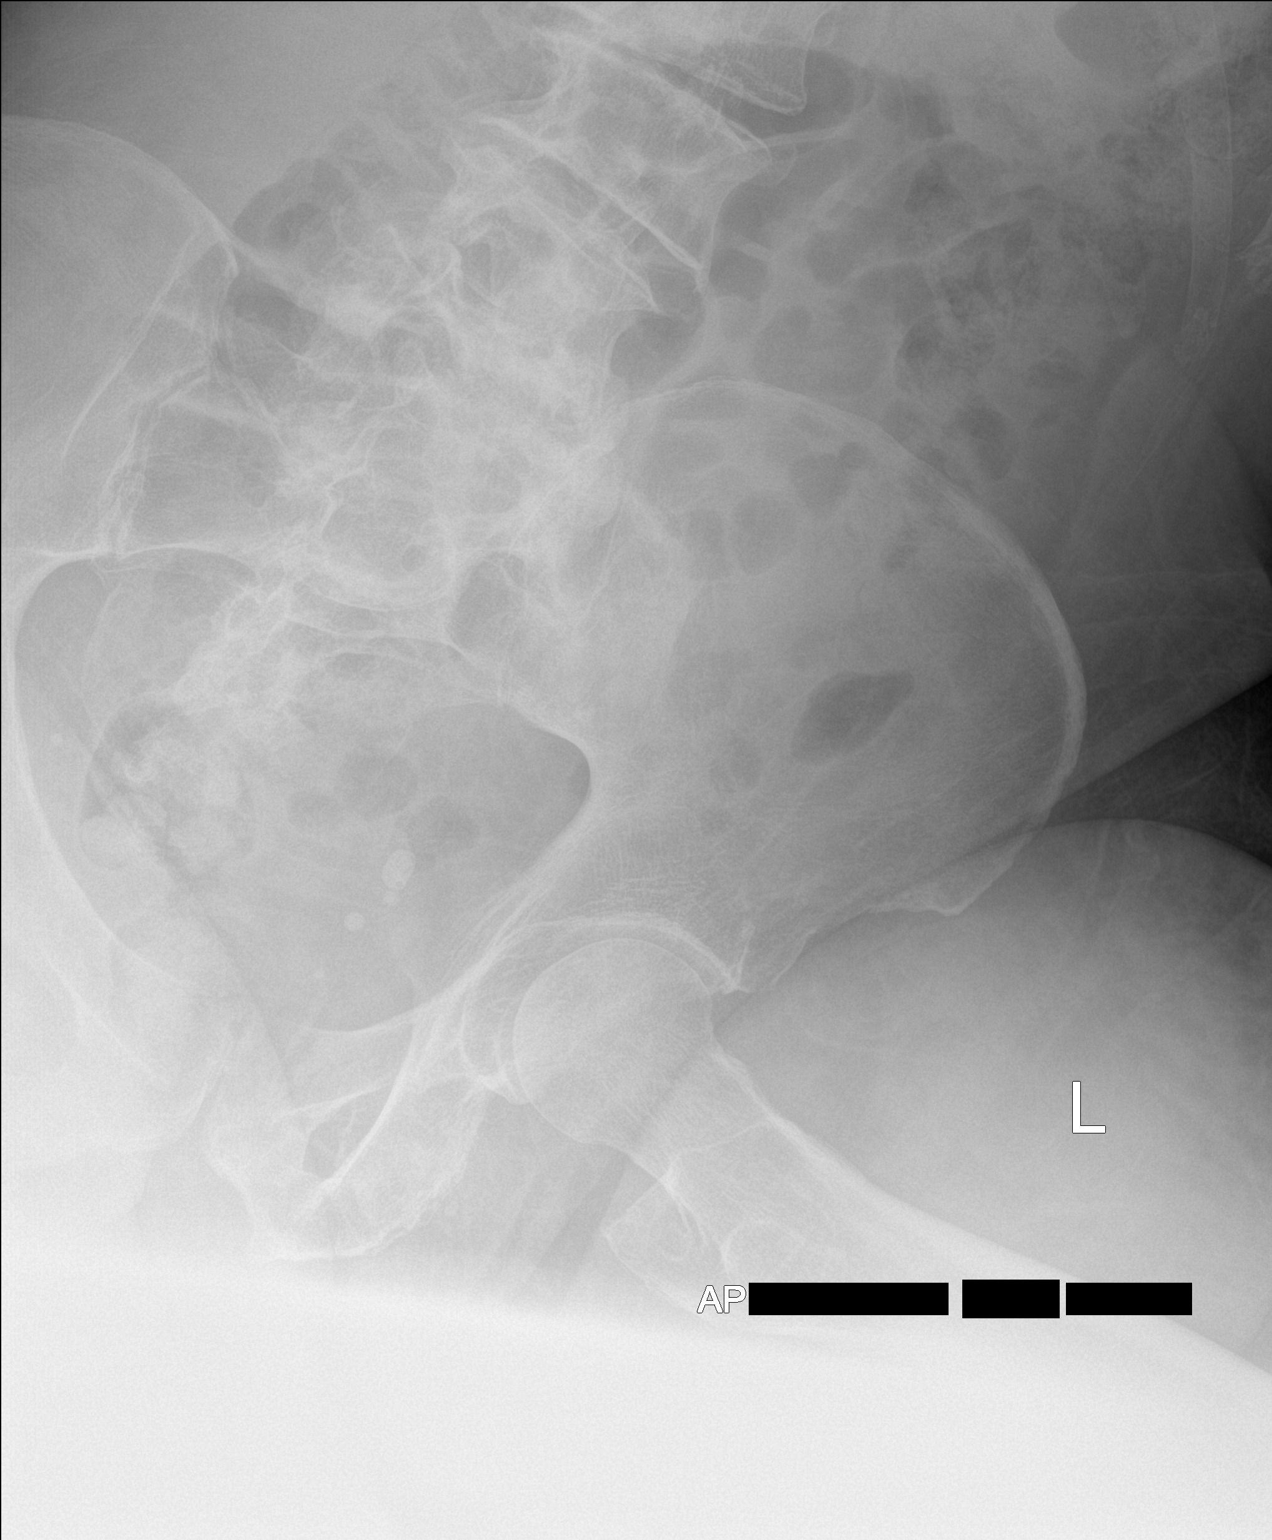

[2 of 2 positions shown; findings below may reference images not displayed]

FINDINGS: Scattered large and small bowel gas is noted. Gallstone is again
seen. No findings of constipation or obstruction are noted.
Degenerative change of the lumbar spine is again seen.
IMPRESSION: Chronic changes without acute abnormality.
